# Patient Record
Sex: Female | Born: 1940 | Race: White | Hispanic: No | Marital: Married | Smoking: Never smoker
Health system: Southern US, Community
[De-identification: ages and names within clinical notes are randomized; demographics above are authoritative.]

## PROBLEM LIST (undated history)

## (undated) DIAGNOSIS — E785 Hyperlipidemia, unspecified: Secondary | ICD-10-CM

## (undated) DIAGNOSIS — I447 Left bundle-branch block, unspecified: Secondary | ICD-10-CM

## (undated) DIAGNOSIS — M545 Low back pain, unspecified: Secondary | ICD-10-CM

## (undated) DIAGNOSIS — D869 Sarcoidosis, unspecified: Secondary | ICD-10-CM

## (undated) DIAGNOSIS — K219 Gastro-esophageal reflux disease without esophagitis: Secondary | ICD-10-CM

## (undated) DIAGNOSIS — J329 Chronic sinusitis, unspecified: Secondary | ICD-10-CM

## (undated) DIAGNOSIS — T8859XA Other complications of anesthesia, initial encounter: Secondary | ICD-10-CM

## (undated) DIAGNOSIS — Z972 Presence of dental prosthetic device (complete) (partial): Secondary | ICD-10-CM

## (undated) DIAGNOSIS — G4730 Sleep apnea, unspecified: Secondary | ICD-10-CM

## (undated) DIAGNOSIS — I480 Paroxysmal atrial fibrillation: Secondary | ICD-10-CM

## (undated) DIAGNOSIS — G8929 Other chronic pain: Secondary | ICD-10-CM

## (undated) DIAGNOSIS — E876 Hypokalemia: Secondary | ICD-10-CM

## (undated) DIAGNOSIS — E079 Disorder of thyroid, unspecified: Secondary | ICD-10-CM

## (undated) DIAGNOSIS — Q248 Other specified congenital malformations of heart: Secondary | ICD-10-CM

## (undated) DIAGNOSIS — R002 Palpitations: Secondary | ICD-10-CM

## (undated) DIAGNOSIS — Z8489 Family history of other specified conditions: Secondary | ICD-10-CM

## (undated) DIAGNOSIS — M1990 Unspecified osteoarthritis, unspecified site: Secondary | ICD-10-CM

## (undated) DIAGNOSIS — N390 Urinary tract infection, site not specified: Secondary | ICD-10-CM

## (undated) DIAGNOSIS — C4490 Unspecified malignant neoplasm of skin, unspecified: Secondary | ICD-10-CM

## (undated) DIAGNOSIS — I499 Cardiac arrhythmia, unspecified: Secondary | ICD-10-CM

## (undated) DIAGNOSIS — E039 Hypothyroidism, unspecified: Secondary | ICD-10-CM

## (undated) DIAGNOSIS — F419 Anxiety disorder, unspecified: Secondary | ICD-10-CM

## (undated) DIAGNOSIS — K5190 Ulcerative colitis, unspecified, without complications: Secondary | ICD-10-CM

## (undated) DIAGNOSIS — E559 Vitamin D deficiency, unspecified: Secondary | ICD-10-CM

## (undated) HISTORY — DX: Urinary tract infection, site not specified: N39.0

## (undated) HISTORY — DX: Sarcoidosis, unspecified: D86.9

## (undated) HISTORY — DX: Low back pain, unspecified: M54.50

## (undated) HISTORY — PX: BREAST CYST ASPIRATION: SHX578

## (undated) HISTORY — PX: OTHER SURGICAL HISTORY: SHX169

## (undated) HISTORY — DX: Unspecified malignant neoplasm of skin, unspecified: C44.90

## (undated) HISTORY — DX: Essential (primary) hypertension: I10

## (undated) HISTORY — DX: Gastro-esophageal reflux disease without esophagitis: K21.9

## (undated) HISTORY — DX: Chronic sinusitis, unspecified: J32.9

## (undated) HISTORY — DX: Low back pain: M54.5

## (undated) HISTORY — DX: Other chronic pain: G89.29

## (undated) HISTORY — DX: Hypothyroidism, unspecified: E03.9

## (undated) HISTORY — DX: Palpitations: R00.2

## (undated) HISTORY — DX: Hyperlipidemia, unspecified: E78.5

---

## 2004-10-16 ENCOUNTER — Ambulatory Visit: Payer: Self-pay | Admitting: Internal Medicine

## 2005-12-14 ENCOUNTER — Ambulatory Visit: Payer: Self-pay | Admitting: Internal Medicine

## 2007-01-13 ENCOUNTER — Ambulatory Visit: Payer: Self-pay | Admitting: Internal Medicine

## 2008-03-22 ENCOUNTER — Ambulatory Visit: Payer: Self-pay | Admitting: Internal Medicine

## 2009-04-03 ENCOUNTER — Ambulatory Visit: Payer: Self-pay | Admitting: Internal Medicine

## 2009-04-08 ENCOUNTER — Ambulatory Visit: Payer: Self-pay | Admitting: Internal Medicine

## 2009-10-07 ENCOUNTER — Ambulatory Visit: Payer: Self-pay | Admitting: Internal Medicine

## 2010-12-04 ENCOUNTER — Ambulatory Visit: Payer: Self-pay | Admitting: Internal Medicine

## 2011-11-16 ENCOUNTER — Encounter: Payer: Self-pay | Admitting: *Deleted

## 2011-11-16 ENCOUNTER — Encounter: Payer: Self-pay | Admitting: Internal Medicine

## 2011-11-17 ENCOUNTER — Encounter: Payer: Self-pay | Admitting: Internal Medicine

## 2011-11-17 ENCOUNTER — Ambulatory Visit (INDEPENDENT_AMBULATORY_CARE_PROVIDER_SITE_OTHER): Payer: Medicare Other | Admitting: Internal Medicine

## 2011-11-17 ENCOUNTER — Ambulatory Visit (INDEPENDENT_AMBULATORY_CARE_PROVIDER_SITE_OTHER)
Admission: RE | Admit: 2011-11-17 | Discharge: 2011-11-17 | Disposition: A | Discharge status: Home / Self Care | Payer: Medicare Other | Source: Ambulatory Visit | Attending: Internal Medicine | Admitting: Internal Medicine

## 2011-11-17 VITALS — BP 142/76 | HR 76 | Temp 97.6°F | Ht 62.0 in | Wt 147.4 lb

## 2011-11-17 DIAGNOSIS — I2721 Secondary pulmonary arterial hypertension: Secondary | ICD-10-CM

## 2011-11-17 DIAGNOSIS — D869 Sarcoidosis, unspecified: Secondary | ICD-10-CM | POA: Insufficient documentation

## 2011-11-17 DIAGNOSIS — I2789 Other specified pulmonary heart diseases: Secondary | ICD-10-CM

## 2011-11-17 NOTE — Assessment & Plan Note (Signed)
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  For now do not recommend anything but the basic w/u to include V/Q and ono RA and will return for pft's and presentation to Dr Delton Coombes once these labs are available to review plus baseline 6 min walk

## 2011-11-17 NOTE — Progress Notes (Deleted)
Subjective:    Patient ID: Kristi Barrera, female    DOB: 03-22-1941, 71 y.o.   MRN: [id number]  HPI    Review of Systems  Constitutional: Negative for fever, chills and unexpected weight change.  HENT: Negative for ear pain, nosebleeds, congestion, sore throat, rhinorrhea, sneezing, trouble swallowing, dental problem, voice change, postnasal drip and sinus pressure.   Eyes: Negative for visual disturbance.  Respiratory: Negative for cough, choking and shortness of breath.   Cardiovascular: Negative for chest pain and leg swelling.  Gastrointestinal: Negative for vomiting, abdominal pain and diarrhea.  Genitourinary: Negative for difficulty urinating.  Musculoskeletal: Negative for arthralgias.  Skin: Negative for rash.  Neurological: Negative for tremors, syncope and headaches.  Hematological: Does not bruise/bleed easily.       Objective:   Physical Exam        Assessment & Plan:

## 2011-11-17 NOTE — Patient Instructions (Addendum)
Please remember to go to the  x-ray department downstairs for your tests - we will call you with the results when they are available.    Please see patient coordinator before you leave today  to schedule v/q lung scan and an overnight oxygen study  Please schedule a follow up office visit in 6 weeks, call sooner if needed with PFT's and 6 minute walk on return

## 2011-11-17 NOTE — Progress Notes (Signed)
Subjective:    Patient ID: Kristi Barrera, female    DOB: 10/18/1940   MRN: [id number]  HPI  45 yowm never smoker dx at [facility] in 1980[facility] at [facility] with sarcoid (doesn't remember bx) never treated with prednisone referred by Dr Judithann Sheen to pulmonary clinic 11/17/2011 for [facility]   11/17/2011 1st pulmonary eval cc swoshing in ear lead to echo dx of PAH, stopped walking in Nov 2012 due to knee but able to vacuum s sob and denies ever being limited by breathing.. No h/o dvt/ blood clots / hypersomnolence, collagen vasc dz/ liver dz or HIV exp  Sleeping ok without nocturnal  or early am exacerbation  of respiratory  C/o's.  Also denies any obvious fluctuation of symptoms with weather or environmental changes or other aggravating or alleviating factors except as outlined above.  Review of Systems  Constitutional: Negative for fever, chills and unexpected weight change.  HENT: Positive for sneezing and postnasal drip. Negative for ear pain, nosebleeds, congestion, sore throat, rhinorrhea, trouble swallowing, dental problem, voice change and sinus pressure.   Eyes: Negative for visual disturbance.  Respiratory: Negative for cough, choking and shortness of breath.   Cardiovascular: Negative for chest pain and leg swelling.  Gastrointestinal: Negative for vomiting, abdominal pain and diarrhea.  Genitourinary: Negative for difficulty urinating.  Musculoskeletal: Negative for arthralgias.  Skin: Negative for rash.  Neurological: Negative for tremors, syncope and headaches.  Hematological: Does not bruise/bleed easily.       Objective:   Physical Exam  Pleasant amb wf nad  Wt 147 11/17/2011 HEENT: nl dentition, turbinates, and orophanx. Nl external ear canals without cough reflex   NECK :  without JVD/Nodes/TM/ nl carotid upstrokes bilaterally   LUNGS: no acc muscle use, clear to A and P bilaterally without cough on insp or exp maneuvers   CV:  RRR  no s3 or murmur or increase in P2, no edema    ABD:  soft and nontender with nl excursion in the supine position. No bruits or organomegaly, bowel sounds nl  MS:  warm without deformities, calf tenderness, cyanosis or clubbing  SKIN: warm and dry without lesions    NEURO:  alert, approp, no deficits     CXR  11/17/2011 :  Findings: Airspace disease in the right lower lobe compatible with pneumonia. Heart is normal size. Left lung is clear. No effusions or acute bony abnormality.         Assessment & Plan:

## 2011-11-18 ENCOUNTER — Telehealth: Payer: Self-pay | Admitting: Internal Medicine

## 2011-11-18 NOTE — Telephone Encounter (Signed)
Spoke with pt. She states that when she went to [facility] to get copy of cxr on a disc, they advised that she did not have cxr done there. She states that her last cxr was done yrs ago and she can not recall where she had this done. I called Dr Judithann Sheen to double check and I was also told she never had cxr done there. Will forward to MW so that he is aware. Pt is coming back 12-31-11 for rov with 6mw and PFT.

## 2011-11-19 NOTE — Telephone Encounter (Signed)
aware

## 2011-11-23 ENCOUNTER — Encounter: Payer: Self-pay | Admitting: Internal Medicine

## 2011-11-24 ENCOUNTER — Other Ambulatory Visit: Payer: Self-pay | Admitting: Internal Medicine

## 2011-11-24 ENCOUNTER — Telehealth: Payer: Self-pay | Admitting: Internal Medicine

## 2011-11-24 ENCOUNTER — Encounter (HOSPITAL_COMMUNITY)
Admission: RE | Admit: 2011-11-24 | Discharge: 2011-11-24 | Disposition: A | Discharge status: Home / Self Care | Payer: Medicare Other | Source: Ambulatory Visit | Attending: Internal Medicine | Admitting: Internal Medicine

## 2011-11-24 DIAGNOSIS — I10 Essential (primary) hypertension: Secondary | ICD-10-CM | POA: Insufficient documentation

## 2011-11-24 DIAGNOSIS — J984 Other disorders of lung: Secondary | ICD-10-CM | POA: Insufficient documentation

## 2011-11-24 DIAGNOSIS — J449 Chronic obstructive pulmonary disease, unspecified: Secondary | ICD-10-CM

## 2011-11-24 DIAGNOSIS — I2721 Secondary pulmonary arterial hypertension: Secondary | ICD-10-CM

## 2011-11-24 DIAGNOSIS — I2789 Other specified pulmonary heart diseases: Secondary | ICD-10-CM | POA: Insufficient documentation

## 2011-11-24 MED ORDER — XENON XE 133 GAS
20.2000 | GAS_FOR_INHALATION | Freq: Once | RESPIRATORY_TRACT | Status: AC | PRN
  Administered 2011-11-24: 20.2 via RESPIRATORY_TRACT

## 2011-11-24 MED ORDER — TECHNETIUM TO 99M ALBUMIN AGGREGATED
3.1000 | Freq: Once | INTRAVENOUS | Status: AC | PRN
  Administered 2011-11-24: 3 via INTRAVENOUS

## 2011-11-24 NOTE — Telephone Encounter (Signed)
I spoke with pt and she is requesting her test results that were done today. Also pt was upset that no one called her to let her know she was going to be set up on oxygen at night until the dme company had called and told her. Pt aware MW is not in the office this afternoon and was fine with a call back when he returns. Please advise MW thanks

## 2011-11-25 ENCOUNTER — Encounter: Payer: Self-pay | Admitting: Internal Medicine

## 2011-11-25 NOTE — Progress Notes (Signed)
Quick Note:  Spoke with pt and notified of results per Dr. Wert. Pt verbalized understanding and denied any questions.  ______ 

## 2011-11-25 NOTE — Telephone Encounter (Signed)
Discussed 02 study and need to wear it at hs for now Plans f/u in [place]  >>>Need cxr reports from Dr Judithann Sheen office to complete the w/u here.

## 2011-11-25 NOTE — Telephone Encounter (Signed)
I spoke with the pt and she states that she wants to keep her appt here with MW on 12-31-11 b.c she is already scheduled for 6mw and pft's same day. cxr never done @ Dr Judithann Sheen office. MW aware.

## 2011-12-04 ENCOUNTER — Encounter: Payer: Self-pay | Admitting: Internal Medicine

## 2011-12-16 ENCOUNTER — Ambulatory Visit: Payer: Self-pay | Admitting: Internal Medicine

## 2011-12-31 ENCOUNTER — Other Ambulatory Visit (INDEPENDENT_AMBULATORY_CARE_PROVIDER_SITE_OTHER): Payer: Medicare Other

## 2011-12-31 ENCOUNTER — Ambulatory Visit (INDEPENDENT_AMBULATORY_CARE_PROVIDER_SITE_OTHER): Payer: Medicare Other | Admitting: Internal Medicine

## 2011-12-31 ENCOUNTER — Encounter: Payer: Self-pay | Admitting: Internal Medicine

## 2011-12-31 VITALS — BP 136/68 | HR 76 | Temp 98.3°F | Ht 62.0 in | Wt 146.0 lb

## 2011-12-31 DIAGNOSIS — R0600 Dyspnea, unspecified: Secondary | ICD-10-CM

## 2011-12-31 DIAGNOSIS — D869 Sarcoidosis, unspecified: Secondary | ICD-10-CM

## 2011-12-31 DIAGNOSIS — I2721 Secondary pulmonary arterial hypertension: Secondary | ICD-10-CM

## 2011-12-31 DIAGNOSIS — I2789 Other specified pulmonary heart diseases: Secondary | ICD-10-CM

## 2011-12-31 LAB — PULMONARY FUNCTION TEST

## 2011-12-31 NOTE — Progress Notes (Signed)
Subjective:    Patient ID: Kristi Barrera, female    DOB: 07-06-1941   MRN: [id number]  HPI  77 yowm never smoker dx at [facility] in 1980's at [facility] with sarcoid (doesn't remember bx) never treated with prednisone referred by Dr Judithann Sheen to pulmonary clinic 11/17/2011 for [facility]   11/17/2011 1st pulmonary eval cc swoshing in ear  >  echo dx of PAH, stopped walking in Nov 2012 due to knee but able to vacuum s sob and denies ever being limited by breathing.. No h/o dvt/ blood clots / hypersomnolence, collagen vasc dz/ liver dz or HIV exp rec   schedule   - V/Q lung scan 11/24/11 >  Matched defect RLL,  Small unmatched defect Lingula (very unlikely to cause PAH)    - ONO RA 11/17/2011 >  Sat < 88% x > rec noct 02 2lpm    12/31/2011 f/u ov/[employer] cc sleeping better on 02, no limiting sob, no sign cough. No ocular or articular complaints or rash.  Sleeping ok without nocturnal  or early am exacerbation  of respiratory  C/o's.  Also denies any obvious fluctuation of symptoms with weather or environmental changes or other aggravating or alleviating factors except as outlined above.  ROS  At present neg for  any significant sore throat, dysphagia, dental problems, itching, sneezing,  nasal congestion or excess/ purulent secretions, ear ache,   fever, chills, sweats, unintended wt loss, pleuritic or exertional cp, hemoptysis, palpitations, orthopnea pnd or leg swelling.  Also denies presyncope, palpitations, heartburn, abdominal pain, anorexia, nausea, vomiting, diarrhea  or change in bowel or urinary habits, change in stools or urine, dysuria,hematuria,  rash, arthralgias, visual complaints, headache, numbness weakness or ataxia or problems with walking or coordination. No noted change in mood/affect or memory.            Objective:   Physical Exam  Pleasant amb wf nad  Wt 147 11/17/2011 > 12/31/2011  146   HEENT: nl dentition, turbinates, and orophanx. Nl external ear canals without cough  reflex   NECK :  without JVD/Nodes/TM/ nl carotid upstrokes bilaterally   LUNGS: no acc muscle use,  R lung base crackles on  on insp but no cough  CV:  RRR  no s3 or murmur or increase in P2, no edema   ABD:  soft and nontender with nl excursion in the supine position. No bruits or organomegaly, bowel sounds nl  MS:  warm without deformities, calf tenderness, cyanosis or clubbing       CXR  11/17/2011 :  Findings: Airspace disease in the right lower lobe compatible with pneumonia. Heart is normal size. Left lung is clear. No effusions or acute bony abnormality.         Assessment & Plan:

## 2011-12-31 NOTE — Patient Instructions (Signed)
Please see patient coordinator before you leave today  to schedule echocardiogram to be done the first week in August.  You need to be seen  within a week after the echocardiogram by  Dr Kendrick Fries in [place]  Continue to wear the 02 at bedtime for now  Please remember to go to the lab  department downstairs for your tests - we will call you with the results when they are available.

## 2011-12-31 NOTE — Progress Notes (Signed)
PFT done today. 

## 2011-12-31 NOTE — Assessment & Plan Note (Signed)
-   Dx by Guido Sander mid 80's.   - Abn cxr 11/17/2011 > previous cxr's requested.   - PFT's 12/31/2011 FEV1 1.55 (82%) ratio 69 and DLC0 68% and DLC0 121   - ESR 15, ace level < 1 on 12/31/2011 .   No indication for rx at this point.  A good general rule of thumb for sarcoid is as that if it didn't need rx at dx then it shouldn't need rx now years after the original dx with no convincing dz activity in meantime and no role in rx of PAH anyway, the primary issue she has at this point.

## 2011-12-31 NOTE — Assessment & Plan Note (Addendum)
-   Kristi Barrera 10/27/11 Mild mr with nl LA, PA pressure 51 worse vs prev study 2011    - V/Q lung scan 11/24/11 >  Matched defect RLL,  Small unmatched defect Lingula (very unlikely to cause PAH)    - ONO RA 11/17/2011 >  Sat < 88% x > rec noct 02 2lpm    - 6 min walk 12/31/2011 > 363 m and no desat  Not clear she is really limited by PAH at this point and does have noct desat so reasonable to try hs 02 x 3 months then repeat Kristi and see if any progression or regression in severity pa systolic

## 2011-12-31 NOTE — Progress Notes (Signed)
Quick Note:  Spoke with pt and notified of results per Dr. Wert. Pt verbalized understanding and denied any questions.  ______ 

## 2012-01-01 NOTE — Progress Notes (Signed)
Quick Note:  Spoke with pt and notified of results per Dr. Wert. Pt verbalized understanding and denied any questions.  ______ 

## 2012-02-23 ENCOUNTER — Other Ambulatory Visit: Payer: Self-pay

## 2012-02-23 ENCOUNTER — Other Ambulatory Visit (INDEPENDENT_AMBULATORY_CARE_PROVIDER_SITE_OTHER): Payer: Medicare Other

## 2012-02-23 DIAGNOSIS — R0609 Other forms of dyspnea: Secondary | ICD-10-CM

## 2012-02-23 DIAGNOSIS — I2789 Other specified pulmonary heart diseases: Secondary | ICD-10-CM

## 2012-02-23 DIAGNOSIS — I2721 Secondary pulmonary arterial hypertension: Secondary | ICD-10-CM

## 2012-02-24 ENCOUNTER — Encounter: Payer: Self-pay | Admitting: Internal Medicine

## 2012-02-29 ENCOUNTER — Encounter: Payer: Self-pay | Admitting: Pulmonary Disease

## 2012-02-29 ENCOUNTER — Ambulatory Visit (INDEPENDENT_AMBULATORY_CARE_PROVIDER_SITE_OTHER): Payer: Medicare Other | Admitting: Pulmonary Disease

## 2012-02-29 ENCOUNTER — Telehealth: Payer: Self-pay | Admitting: Internal Medicine

## 2012-02-29 VITALS — BP 122/72 | HR 78 | Temp 98.2°F | Ht 62.0 in | Wt 147.0 lb

## 2012-02-29 DIAGNOSIS — G4710 Hypersomnia, unspecified: Secondary | ICD-10-CM

## 2012-02-29 DIAGNOSIS — R400 Somnolence: Secondary | ICD-10-CM

## 2012-02-29 DIAGNOSIS — I2789 Other specified pulmonary heart diseases: Secondary | ICD-10-CM

## 2012-02-29 DIAGNOSIS — G4733 Obstructive sleep apnea (adult) (pediatric): Secondary | ICD-10-CM

## 2012-02-29 DIAGNOSIS — D869 Sarcoidosis, unspecified: Secondary | ICD-10-CM

## 2012-02-29 DIAGNOSIS — I2721 Secondary pulmonary arterial hypertension: Secondary | ICD-10-CM

## 2012-02-29 NOTE — Assessment & Plan Note (Addendum)
Her daytime somnolence and paroxysmal nocturnal dyspnea in the setting of mildly elevated PA pressure are suggestive of obstructive sleep apnea.  Plan: -[facility] at [facility].

## 2012-02-29 NOTE — Progress Notes (Signed)
Patient ID: Kristi Barrera, female    DOB: [birth year]   MRN: [id number]  Synopsis: [name] was first referred to the [facility] office for pulmonary hypertension April 2013. She has a past medical history significant for sarcoidosis diagnosed by Dr. Guido Sander at [facility] in the 1980s. She has never required treatment for this. In 2013 she was found to have a PA pressure of 51 on a transthoracic echocardiogram in the setting of no symptoms and was referred to [facility] pulmonary for further evaluation. A CT scan performed in may of 2013 showed a small match defect in the right lower lobe. An overnight sleep oximetry study on room air in April 2013 showed oxygen desaturation below 88% for 11 minutes and she was given 2 L of oxygen for nocturnal use.    HPI   Subjective:   02/29/12 ROV -- Mrs. Kristi Barrera comes to our clinic today for further evaluation of pulmonary hypertension. As noted above she's been followed by Dr. Sherene Sires recently for pulmonary hypertension found on a transthoracic echocardiogram. She tells me that she has no symptoms of shortness of breath and has no limitations. She vacuums her house, goes to the grocery store, and exercises it least once a week without limitation. She does not have cough or chest pain. She states that she has occasional ankle swelling which her primary care physician has attributed to amlodipine. She states that she does often wake up gasping for air in the middle the night and frequently has daytime somnolence. Typically she wakes up in the middle of a dream. Past Medical History  Diagnosis Date  . GERD (gastroesophageal reflux disease)   . Palpitations   . Hypothyroidism   . HTN (hypertension)   . Hyperlipidemia   . Chest pain   . Sinusitis   . Recurrent UTI   . Chronic low back pain   . Sarcoidosis     [place]---[facility] 15-25yrs ago  . Skin cancer         Review of Systems  Constitutional: Negative for fever, diaphoresis and fatigue.  HENT:  Negative for nosebleeds, congestion, rhinorrhea and postnasal drip.   Respiratory: Negative for cough, chest tightness, shortness of breath and stridor.   Cardiovascular: Positive for leg swelling. Negative for chest pain.     Physical Exam  Filed Vitals:   02/29/12 1349  BP: 122/72  Pulse: 78  Temp: 98.2 F (36.8 C)  TempSrc: Oral  Height: 5\' 2"  (1.575 m)  Weight: 147 lb (66.679 kg)  SpO2: 98%   Gen: well appearing, no acute distress HEENT: NCAT, PERRL, EOMi, OP clear, neck supple without masses PULM: CTA B CV: RRR, systolic murmur RUSB, no JVD AB: BS+, soft, nontender, no hsm Ext: warm, no edema, no clubbing, no cyanosis Derm: no rash or skin breakdown Neuro: A&Ox4, CN II-XII intact, strength 5/5 in all 4 extremities  Assessment & Plan:   PAH (pulmonary artery hypertension) Mrs. low has been found to have an elevated PA pressure on an echocardiogram in 2013 and based on this she has had a workup for pulmonary hypertension. She's not had a diagnostic test for pulmonary hypertension which is a right heart catheterization. Considering the fact that she is completely asymptomatic and has no shortness of breath think it would be unwise first proceed with a right heart cath at this point. She has symptoms are consistent with obstructive sleep apnea and that she has daytime somnolence and often wakes up gasping for air while dreaming. I explained to  her that I think that this may be a reasonable explanation for her shortness of breath. Alternatively sarcoid can rarely cause pulmonary hypertension. Regardless of the cause I think we are safe to hold off on treatment considering her excellent functional status.  Plan: -Sleep study -Followup results of annual echocardiogram being performed by her cardiologist for valvular insufficiency.  Sarcoidosis She has a persistent right lower lobe infiltrate which she states she's noted out since the 1980s. At that point she underwent a  transbronchial biopsy by Dr. Guido Sander and was diagnosed with sarcoidosis. Her imaging findings are stable and she is asymptomatic so there is no indication for treatment at this time.  OSA (obstructive sleep apnea) Her daytime somnolence and paroxysmal nocturnal dyspnea in the setting of mildly elevated PA pressure are suggestive of obstructive sleep apnea.  Plan: -[facility] at [facility].   Updated Medication List Outpatient Encounter Prescriptions as of 02/29/2012  Medication Sig Dispense Refill  . ALPRAZolam (XANAX) 0.25 MG tablet Take 0.25 mg by mouth 2 (two) times daily as needed.       Marland Kitchen amLODipine (NORVASC) 10 MG tablet Take 5 mg by mouth daily.       . benazepril (LOTENSIN) 40 MG tablet Take 40 mg by mouth daily.      Marland Kitchen esomeprazole (NEXIUM) 40 MG capsule Take 40 mg by mouth as needed.       . fenofibrate micronized (LOFIBRA) 134 MG capsule Take 1 tablet by mouth Daily.      . furosemide (LASIX) 40 MG tablet As needed 1-2 times a week 20 mg      . levothyroxine (SYNTHROID) 100 MCG tablet Take 100 mcg by mouth daily.      Marland Kitchen olmesartan (BENICAR) 40 MG tablet Take 40 mg by mouth daily.      . verapamil (VERELAN PM) 240 MG 24 hr capsule Take 240 mg by mouth at bedtime.

## 2012-02-29 NOTE — Assessment & Plan Note (Addendum)
Mrs. low has been found to have an elevated PA pressure on an echocardiogram in 2013 and based on this she has had a workup for pulmonary hypertension. She's not had a diagnostic test for pulmonary hypertension which is a right heart catheterization. Considering the fact that she is completely asymptomatic and has no shortness of breath think it would be unwise first proceed with a right heart cath at this point. She has symptoms are consistent with obstructive sleep apnea and that she has daytime somnolence and often wakes up gasping for air while dreaming. I explained to her that I think that this may be a reasonable explanation for her shortness of breath. Alternatively sarcoid can rarely cause pulmonary hypertension. Regardless of the cause I think we are safe to hold off on treatment considering her excellent functional status.  Plan: -Sleep study -Followup results of annual echocardiogram being performed by her cardiologist for valvular insufficiency.

## 2012-02-29 NOTE — Telephone Encounter (Signed)
Notes Recorded by Nyoka Cowden, MD on 02/24/2012 at 6:23 AM Call patient : Study is c/w PAH, needs eval by [facility] asap with other alternative back to [facility] clinic  I spoke with patient about results and she verbalized understanding and had no questions. She states she is seeing Dr. Kendrick Fries today and will discuss this with him

## 2012-02-29 NOTE — Assessment & Plan Note (Signed)
She has a persistent right lower lobe infiltrate which she states she's noted out since the 1980s. At that point she underwent a transbronchial biopsy by Dr. Guido Sander and was diagnosed with sarcoidosis. Her imaging findings are stable and she is asymptomatic so there is no indication for treatment at this time.

## 2012-02-29 NOTE — Patient Instructions (Signed)
Keep using your oxygen at night We will send you for a sleep study at [facility] here in [facility] exercising regularly We will see you back in 3 months

## 2012-03-07 ENCOUNTER — Telehealth: Payer: Self-pay | Admitting: Pulmonary Disease

## 2012-03-07 NOTE — Telephone Encounter (Signed)
Spoke with pt. She is requesting rx for Remus Loffler b/s this is what the tech at [facility] in [place] advised. She states that she does not want to take med at all, and does not fear not sleeping well. She asked the tech is she could just take a xanax, since she is already prescribed this and feels comfortable with it and they said, no, "we prefer that you take ambien".  I want to speak with someone at "[facility]" to confirm that this is what they are telling her. ATC x 4 at [phone] and line is busy, WCB.

## 2012-03-08 NOTE — Telephone Encounter (Signed)
Called, spoke with pt.  I informed her of below per Dr. Sherene Sires.  She verbalized understanding of this.  Will sign off and route to Mr. Kendrick Fries as Lorain Childes.

## 2012-03-08 NOTE — Telephone Encounter (Signed)
Called [facility]," spoke with Kristi Barrera who states they are recommending pt "take a sleep aid that does not interfere with REM sleep."  I asked if xanax would work and Kristi Barrera replied, "It probably will."  I called, spoke with pt.  The sleep study is scheduled for tomorrow night.  She would like to take xanax 2 tablets of 0.25 mg (she already has this) instead of ambien bc she is comfortable with the xanax as long as it doesn't interfere with REM sleep.  Kristi Barrera is off -- she is requesting Kristi Barrera advise as she has seen him in the past.  Kristi Barrera, pls advise.  Thank you.  [facility] [address] in [place]  Note:  Pt requesting we call her back at [phone].

## 2012-03-08 NOTE — Telephone Encounter (Signed)
Pt states that she will not be available between 1:30 pm & 4:30 pm today.  Antionette Fairy

## 2012-03-08 NOTE — Telephone Encounter (Signed)
Ok to use xanax if typically using this at home

## 2012-03-10 ENCOUNTER — Telehealth: Payer: Self-pay | Admitting: Internal Medicine

## 2012-03-10 NOTE — Telephone Encounter (Signed)
I spoke with pt and she stated she had her sleep study done ([facility]) and was told she would need a cpap to wear at night. 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 Please advise Dr. Kendrick Fries thanks

## 2012-03-14 NOTE — Telephone Encounter (Signed)
OK, fine by me 

## 2012-03-14 NOTE — Telephone Encounter (Signed)
noted 

## 2012-03-15 NOTE — Telephone Encounter (Signed)
I spoke with pt and made her okay by Dr. Kendrick Fries to hold off on cpap until after her surgery. She voiced her understanding and will continue to use her oxygen at bedtime. Nothing further was needed

## 2012-04-19 ENCOUNTER — Telehealth: Payer: Self-pay | Admitting: *Deleted

## 2012-04-19 ENCOUNTER — Encounter: Payer: Self-pay | Admitting: Pulmonary Disease

## 2012-04-19 NOTE — Telephone Encounter (Signed)
Called [facility] and spoke with [place].  She states that it is rec that the pt repeat the CPAP titration study, since REM sleep was not observed. She states that she contacted the pt herself to reschedule this, and pt told her that she is getting ready to have surgery, and will have to call back after this is done.

## 2012-04-19 NOTE — Telephone Encounter (Signed)
thanks

## 2012-04-19 NOTE — Telephone Encounter (Signed)
Message copied by Christen Butter on Tue Apr 19, 2012  1:57 PM ------      Message from: Veto Kemps B      Created: Tue Apr 19, 2012  1:30 PM       L,            Canyou call to see if she needs an Rx for CPAP or if she needs to go back for another? Her recent PSG says she needs 6cm H20, but they suggested repeating the test.            B

## 2012-05-02 ENCOUNTER — Telehealth: Payer: Self-pay | Admitting: Pulmonary Disease

## 2012-05-02 NOTE — Telephone Encounter (Signed)
I have not seen the study.    I felt that she needed a CPAP titration because it had been years since she had one.  It is really between the insurance company and [facility] if she needs the third study, not sure what I have to add other than I think she needs an adequate CPAP titration study.

## 2012-05-02 NOTE — Telephone Encounter (Signed)
Spoke to pt she [facility] 2 [facility] at [facility] in [place] the second study the person at [facility] told her she did not go into a deep rem sleep and that she would have to repeat her second study again she called her insurance company not anyone else but i am not sure if dr Kendrick Fries has even seen the 2 studies yet they are not scanned into chart so i advisted her not to do anything until she heard from dr [facility] about the results from the 2 studies Tobe Sos

## 2012-05-02 NOTE — Telephone Encounter (Signed)
I spoke with pt and she stated her insurance is wanting proof that pt needs this 3rd sleep study done (cpap titration ordered by Dr. Kendrick Fries in september 2013). She stated the insurance is wanting our office to contact them. Please advise PCC's thanks

## 2012-05-03 NOTE — Telephone Encounter (Signed)
Called [facility] and had them to fax both sleep studies here so dr Kendrick Fries qould review them Tobe Sos

## 2012-05-12 ENCOUNTER — Telehealth: Payer: Self-pay | Admitting: Pulmonary Disease

## 2012-05-12 DIAGNOSIS — G4733 Obstructive sleep apnea (adult) (pediatric): Secondary | ICD-10-CM

## 2012-05-12 NOTE — Telephone Encounter (Signed)
I spoke with  Verlon Au and she does have the pt sleep studies to give to Dr. Kendrick Fries on Monday. I advised the pt of this and she states understanding. I am going to forward message to Dr. Kendrick Fries as well as a reminder. Please advise. Carron Curie, CMA

## 2012-05-16 NOTE — Telephone Encounter (Signed)
I have been able to review the tests.  The sleep study said they were not adequate tests and they want her to come back, but I disagree.  Tell her to continue using CPAP 6cm H20 at night.

## 2012-05-16 NOTE — Telephone Encounter (Signed)
LMOMTCB x 1 for the pt. 

## 2012-05-16 NOTE — Telephone Encounter (Signed)
The notes I received from the sleep center state that she did well on 6cm H20 CPAP.  We will write an Rx for this.

## 2012-05-16 NOTE — Telephone Encounter (Signed)
Spoke with pt and she is aware and wants to use [facility]  On [address] in [place].

## 2012-05-16 NOTE — Telephone Encounter (Signed)
Pt returned call. Kristi Barrera  

## 2012-05-16 NOTE — Telephone Encounter (Signed)
lmomtcb  

## 2012-05-16 NOTE — Telephone Encounter (Signed)
I have seen both studies.  Keep using CPAP at 6cm H20.  Unless this is contrary to what they told her, this is the dose that I interpreted from her results.

## 2012-05-16 NOTE — Telephone Encounter (Signed)
Returned call from patient.Marland KitchenMarland KitchenPatient states that she never received her CPAP--she was never set up for one d/t the center ([facility]) telling her she needed another sleep study. They told her that she needs to repeat her sleep study with ambien before they will give her a CPAP. Patient states that she does not feel that there is a need to have another study done and she does not want to take ambien-states she does not react well to those types of medications (has a hard time waking up). Would like to know if this is necessary or if there was a way around having another sleep study.   Dr Kendrick Fries pleae advise. Thanks.

## 2012-05-16 NOTE — Telephone Encounter (Signed)
Pt is calling for sleep study results. She states she has had 2 studies done, one without cpap and one with cpap and theses were done at the end of Aug 2013 and first part of Sept 2013. Dr. Kendrick Fries, pls advise.

## 2012-05-27 ENCOUNTER — Encounter: Payer: Self-pay | Admitting: Pulmonary Disease

## 2012-06-27 ENCOUNTER — Telehealth: Payer: Self-pay | Admitting: Pulmonary Disease

## 2012-06-27 NOTE — Telephone Encounter (Signed)
Left Message 3x to schd follow up apt. Sent letter 06/27/12. °

## 2012-07-19 ENCOUNTER — Encounter: Payer: Self-pay | Admitting: Pulmonary Disease

## 2012-08-16 ENCOUNTER — Ambulatory Visit (INDEPENDENT_AMBULATORY_CARE_PROVIDER_SITE_OTHER): Payer: Medicare Other | Admitting: Pulmonary Disease

## 2012-08-16 ENCOUNTER — Encounter: Payer: Self-pay | Admitting: Pulmonary Disease

## 2012-08-16 VITALS — BP 124/70 | HR 70 | Temp 98.0°F | Ht 62.0 in | Wt 150.0 lb

## 2012-08-16 DIAGNOSIS — I2789 Other specified pulmonary heart diseases: Secondary | ICD-10-CM

## 2012-08-16 DIAGNOSIS — G4733 Obstructive sleep apnea (adult) (pediatric): Secondary | ICD-10-CM

## 2012-08-16 DIAGNOSIS — I2721 Secondary pulmonary arterial hypertension: Secondary | ICD-10-CM

## 2012-08-16 DIAGNOSIS — D869 Sarcoidosis, unspecified: Secondary | ICD-10-CM

## 2012-08-16 NOTE — Progress Notes (Signed)
Patient ID: Kristi Barrera, female    DOB: Jul 06, 1941   MRN: [id number]  Synopsis: Kristi Barrera was first referred to the [facility] office for pulmonary hypertension April 2013. She has a past medical history significant for sarcoidosis diagnosed by Dr. Guido Barrera at [facility] in the 1980s. She has never required treatment for this. In 2013 she was found to have a PA pressure of 51 on a transthoracic echocardiogram in the setting of no symptoms and was referred to [facility] pulmonary for further evaluation. A CT scan performed in may of 2013 showed a small match defect in the right lower lobe. An overnight sleep oximetry study on room air in April 2013 showed oxygen desaturation below 88% for 11 minutes and she was given 2 L of oxygen for nocturnal use.    HPI    Subjective:   02/29/12 ROV -- Kristi Barrera comes to our clinic today for further evaluation of pulmonary hypertension. As noted above she's been followed by Dr. Sherene Barrera recently for pulmonary hypertension found on a transthoracic echocardiogram. She tells me that she has no symptoms of shortness of breath and has no limitations. She vacuums her house, goes to the grocery store, and exercises it least once a week without limitation. She does not have cough or chest pain. She states that she has occasional ankle swelling which her primary care physician has attributed to amlodipine. She states that she does often wake up gasping for air in the middle the night and frequently has daytime somnolence. Typically she wakes up in the middle of a dream.  08/16/12 ROV --Kristi Barrera has been doing great since her last visit. She is exercising 2-3 times a week by walking 1-1/2 miles a time. She states she walks at a brisk pace with a friend and experiences no shortness of breath with this exertion. She has been using her CPAP every night for 9 hours and says that she feels well rested throughout the day. She does not have to nap during the day. She denies cough or any  shortness of breath of any kind. She is able to perform all of her activities of daily living without difficulty. She does not have leg swelling. She denies chest pain. She does note some sinus congestion in the mornings after using CPAP via nasal pillows all night long.   Past Medical History  Diagnosis Date  . GERD (gastroesophageal reflux disease)   . Palpitations   . Hypothyroidism   . HTN (hypertension)   . Hyperlipidemia   . Chest pain   . Sinusitis   . Recurrent UTI   . Chronic low back pain   . Sarcoidosis     [place]---[facility] 15-65yrs ago  . Skin cancer         Review of Systems  Constitutional: Negative for fever, diaphoresis and fatigue.  HENT: Negative for nosebleeds, congestion, rhinorrhea and postnasal drip.   Respiratory: Negative for cough, chest tightness, shortness of breath and stridor.   Cardiovascular: Negative for chest pain and leg swelling.     Physical Exam   Filed Vitals:   08/16/12 0929  BP: 124/70  Pulse: 70  Temp: 98 F (36.7 C)  TempSrc: Oral  Height: 5\' 2"  (1.575 m)  Weight: 150 lb (68.04 kg)  SpO2: 98%   Gen: well appearing, no acute distress HEENT: NCAT, EOMi, OP clear,  PULM: CTA B CV: RRR, systolic murmur RUSB, no JVD AB: BS+, soft, nontender, no hsm Ext: warm, no edema, no clubbing, no  cyanosis  Assessment & Plan:   OSA (obstructive sleep apnea) Kristi Barrera to use and benefit from her CPAP. From what she describes she uses at all [facility] long for 9 hours of sleep from 11 PM through 8 AM every day. She struggles somewhat with an inability to sleep on her side because of the tubing.  Plan:  -instructed her to fix the tubing over her headboard so that she can sleep in the side position -Use saline gel for sinus congestion  PAH (pulmonary artery hypertension) Kristi Barrera to remain completely asymptomatic in terms of shortness of breath. The echocardiogram shows the elevated pressure given her complete lack of  symptoms and her regular exercise multiple times per week I see no reason to proceed with a right heart catheterization right now. We will continue to watch her with frequent 6 minute walk testing and frequent visits. She is currently treating her obstructive sleep apnea appropriately with CPAP.  Sarcoidosis Remains in remission. No indication for therapy.    Updated Medication List Outpatient Encounter Prescriptions as of 08/16/2012  Medication Sig Dispense Refill  . ALPRAZolam (XANAX) 0.25 MG tablet Take 0.25 mg by mouth 2 (two) times daily as needed.       Marland Kitchen amLODipine (NORVASC) 10 MG tablet Take 5 mg by mouth daily.       . benazepril (LOTENSIN) 40 MG tablet Take 40 mg by mouth daily.      Marland Kitchen esomeprazole (NEXIUM) 40 MG capsule Take 40 mg by mouth as needed.       . fenofibrate micronized (LOFIBRA) 134 MG capsule Take 1 tablet by mouth Daily.      . furosemide (LASIX) 40 MG tablet As needed 1-2 times a week 20 mg      . levothyroxine (SYNTHROID) 100 MCG tablet Take 100 mcg by mouth daily.      Marland Kitchen olmesartan (BENICAR) 40 MG tablet Take 40 mg by mouth daily.      . verapamil (VERELAN PM) 240 MG 24 hr capsule Take 240 mg by mouth at bedtime.

## 2012-08-16 NOTE — Patient Instructions (Signed)
Continue using your CPAP machine every night. Try to fix the CPAP hose over your headboard so that you can sleep on your side Use saline gel to help prevent sinus congestion Keep exercising regularly Let us know if you develop shortness of breath We will see you back in 6 months

## 2012-08-16 NOTE — Assessment & Plan Note (Signed)
Kristi Barrera continues to remain completely asymptomatic in terms of shortness of breath. The echocardiogram shows the elevated pressure given her complete lack of symptoms and her regular exercise multiple times per week I see no reason to proceed with a right heart catheterization right now. We will continue to watch her with frequent 6 minute walk testing and frequent visits. She is currently treating her obstructive sleep apnea appropriately with CPAP.

## 2012-08-16 NOTE — Assessment & Plan Note (Signed)
Kristi Barrera continues to use and benefit from her CPAP. From what she describes she uses at all [facility] long for 9 hours of sleep from 11 PM through 8 AM every day. She struggles somewhat with an inability to sleep on her side because of the tubing.  Plan:  -instructed her to fix the tubing over her headboard so that she can sleep in the side position -Use saline gel for sinus congestion

## 2012-08-16 NOTE — Assessment & Plan Note (Signed)
Remains in remission. No indication for therapy.

## 2012-09-21 ENCOUNTER — Encounter: Payer: Self-pay | Admitting: Pulmonary Disease

## 2012-12-03 ENCOUNTER — Ambulatory Visit: Payer: Self-pay | Admitting: General Practice

## 2013-01-17 ENCOUNTER — Ambulatory Visit: Payer: Self-pay | Admitting: Internal Medicine

## 2013-02-14 ENCOUNTER — Encounter: Payer: Self-pay | Admitting: Pulmonary Disease

## 2013-02-14 ENCOUNTER — Ambulatory Visit (INDEPENDENT_AMBULATORY_CARE_PROVIDER_SITE_OTHER): Payer: Medicare Other | Admitting: Pulmonary Disease

## 2013-02-14 VITALS — BP 142/60 | HR 60 | Temp 97.5°F | Ht 62.0 in | Wt 150.8 lb

## 2013-02-14 DIAGNOSIS — I2789 Other specified pulmonary heart diseases: Secondary | ICD-10-CM

## 2013-02-14 DIAGNOSIS — I2721 Secondary pulmonary arterial hypertension: Secondary | ICD-10-CM

## 2013-02-14 DIAGNOSIS — R0902 Hypoxemia: Secondary | ICD-10-CM

## 2013-02-14 DIAGNOSIS — G4734 Idiopathic sleep related nonobstructive alveolar hypoventilation: Secondary | ICD-10-CM

## 2013-02-14 DIAGNOSIS — G4733 Obstructive sleep apnea (adult) (pediatric): Secondary | ICD-10-CM

## 2013-02-14 NOTE — Progress Notes (Signed)
Patient ID: Kristi Barrera, female    DOB: [birth year]   MRN: [id number]  Synopsis: Kristi Barrera was first referred to the [facility] office for pulmonary hypertension April 2013. She has a past medical history significant for sarcoidosis diagnosed by Dr. Guido Sander at [facility] in the 1980s. She has never required treatment for this. In 2013 she was found to have a PA pressure of 51 on a transthoracic echocardiogram in the setting of no symptoms and was referred to [facility] pulmonary for further evaluation. A CT scan performed in may of 2013 showed a small match defect in the right lower lobe. An overnight sleep oximetry study on room air in April 2013 showed oxygen desaturation below 88% for 11 minutes and she was given 2 L of oxygen for nocturnal use.    HPI    Subjective:   02/29/12 ROV -- Kristi Barrera comes to our clinic today for further evaluation of pulmonary hypertension. As noted above she's been followed by Dr. Sherene Sires recently for pulmonary hypertension found on a transthoracic echocardiogram. She tells me that she has no symptoms of shortness of breath and has no limitations. She vacuums her house, goes to the grocery store, and exercises it least once a week without limitation. She does not have cough or chest pain. She states that she has occasional ankle swelling which her primary care physician has attributed to amlodipine. She states that she does often wake up gasping for air in the middle the night and frequently has daytime somnolence. Typically she wakes up in the middle of a dream.  08/16/12 ROV --Kristi Barrera has been doing great since her last visit. She is exercising 2-3 times a week by walking 1-1/2 miles a time. She states she walks at a brisk pace with a friend and experiences no shortness of breath with this exertion. She has been using her CPAP every night for 9 hours and says that she feels well rested throughout the day. She does not have to nap during the day. She denies cough or any  shortness of breath of any kind. She is able to perform all of her activities of daily living without difficulty. She does not have leg swelling. She denies chest pain. She does note some sinus congestion in the mornings after using CPAP via nasal pillows all night long.  02/14/2013 ROV >>  Kristi Barrera has been doing well since her last visit from a breathing standpoint. Unfortunately, she has not been able to exercise much because of severe knee pain. She is receiving injections in her knee and hopes to start exercising again in 2-3 weeks. She's not had cough, or shortness of breath with any activity. She continues to use and benefit from her CPAP machine every night. She continues to use and benefit from her oxygen at night.    Past Medical History  Diagnosis Date  . GERD (gastroesophageal reflux disease)   . Palpitations   . Hypothyroidism   . HTN (hypertension)   . Hyperlipidemia   . Chest pain   . Sinusitis   . Recurrent UTI   . Chronic low back pain   . Sarcoidosis     [place]---[facility] 15-26yrs ago  . Skin cancer         Review of Systems  Constitutional: Negative for fever, diaphoresis and fatigue.  HENT: Negative for nosebleeds, congestion, rhinorrhea and postnasal drip.   Respiratory: Negative for cough, chest tightness, shortness of breath and stridor.   Cardiovascular: Negative for chest pain  and leg swelling.     Physical Exam   Filed Vitals:   02/14/13 1031  BP: 142/60  Pulse: 60  Temp: 97.5 F (36.4 C)  TempSrc: Oral  Height: 5\' 2"  (1.575 m)  Weight: 150 lb 12.8 oz (68.402 kg)  SpO2: 97%  RA  Gen: well appearing, no acute distress HEENT: NCAT, EOMi, OP clear,  PULM: CTA B CV: RRR, systolic murmur RUSB, no JVD AB: BS+, soft, nontender, no hsm Ext: warm, no edema, no clubbing, no cyanosis  6 minute walk distance today is 304 m, did not desaturate less than 93% on room air  Assessment & Plan:   PAH (pulmonary artery hypertension) Kristi Barrera's 6  minute walk distance has decreased slightly since the last visit, however I believe that this is due in part to the fact that she has severe knee pain and has been unable to exercise for the last several months. She otherwise has no significant shortness of breath or other symptoms.  I believe that her pulmonary hypertension is in part related to her obstructive sleep apnea.  Plan: -We will repeat his 6 minute walk on the next visit, I'm hoping that it will be improved after her knee pain is taking care of and she will be exercising regularly again before then -If she continues to show decline in her 6 minute walk then we will refer her for a right heart catheterization -We will request a copy of the recent echocardiogram performed by her cardiologist.  OSA (obstructive sleep apnea) Continue CPAP as written  Nocturnal hypoxemia Continue 2 L of oxygen at night with CPAP    Updated Medication List Outpatient Encounter Prescriptions as of 02/14/2013  Medication Sig Dispense Refill  . ALPRAZolam (XANAX) 0.25 MG tablet Take 0.25 mg by mouth 2 (two) times daily as needed.       Marland Kitchen amLODipine (NORVASC) 10 MG tablet Take 5 mg by mouth daily.       . benazepril (LOTENSIN) 40 MG tablet Take 40 mg by mouth daily.      Marland Kitchen esomeprazole (NEXIUM) 40 MG capsule Take 40 mg by mouth as needed.       . fenofibrate micronized (LOFIBRA) 134 MG capsule Take 1 tablet by mouth Daily.      . furosemide (LASIX) 40 MG tablet As needed 1-2 times a week 20 mg      . levothyroxine (SYNTHROID) 100 MCG tablet Take 100 mcg by mouth daily.      Marland Kitchen olmesartan (BENICAR) 40 MG tablet Take 40 mg by mouth daily.      . verapamil (VERELAN PM) 240 MG 24 hr capsule Take 240 mg by mouth at bedtime.       No facility-administered encounter medications on file as of 02/14/2013.

## 2013-02-14 NOTE — Assessment & Plan Note (Signed)
Continue CPAP as written. 

## 2013-02-14 NOTE — Patient Instructions (Signed)
We will request records from Dr. Judithann Sheen office regarding your echo  Keep using your CPAP machine every night  Exercise as much as possible  We will see you back in 6 months or sooner if needed

## 2013-02-14 NOTE — Assessment & Plan Note (Signed)
Kristi Barrera's 6 minute walk distance has decreased slightly since the last visit, however I believe that this is due in part to the fact that she has severe knee pain and has been unable to exercise for the last several months. She otherwise has no significant shortness of breath or other symptoms.  I believe that her pulmonary hypertension is in part related to her obstructive sleep apnea.  Plan: -We will repeat his 6 minute walk on the next visit, I'm hoping that it will be improved after her knee pain is taking care of and she will be exercising regularly again before then -If she continues to show decline in her 6 minute walk then we will refer her for a right heart catheterization -We will request a copy of the recent echocardiogram performed by her cardiologist.

## 2013-02-14 NOTE — Assessment & Plan Note (Signed)
Continue 2 L of oxygen at night with CPAP

## 2013-07-10 ENCOUNTER — Emergency Department: Payer: Self-pay | Admitting: Emergency Medicine

## 2013-07-10 LAB — CBC
MCH: 29.2 pg (ref 26.0–34.0)
MCHC: 32.5 g/dL (ref 32.0–36.0)
RDW: 12.1 % (ref 11.5–14.5)

## 2013-07-10 LAB — COMPREHENSIVE METABOLIC PANEL
Albumin: 3.8 g/dL (ref 3.4–5.0)
BUN: 15 mg/dL (ref 7–18)
Chloride: 110 mmol/L — ABNORMAL HIGH (ref 98–107)
EGFR (Non-African Amer.): >60
Glucose: 85 mg/dL (ref 65–99)
Osmolality: 285 (ref 275–301)
SGOT(AST): 15 U/L (ref 15–37)

## 2013-10-04 ENCOUNTER — Encounter (INDEPENDENT_AMBULATORY_CARE_PROVIDER_SITE_OTHER): Payer: Self-pay

## 2013-10-04 ENCOUNTER — Ambulatory Visit (INDEPENDENT_AMBULATORY_CARE_PROVIDER_SITE_OTHER): Payer: Medicare Other | Admitting: Pulmonary Disease

## 2013-10-04 ENCOUNTER — Encounter: Payer: Self-pay | Admitting: Pulmonary Disease

## 2013-10-04 VITALS — BP 144/72 | HR 76 | Ht 62.0 in | Wt 151.0 lb

## 2013-10-04 DIAGNOSIS — I2789 Other specified pulmonary heart diseases: Secondary | ICD-10-CM

## 2013-10-04 DIAGNOSIS — I2721 Secondary pulmonary arterial hypertension: Secondary | ICD-10-CM

## 2013-10-04 DIAGNOSIS — G4733 Obstructive sleep apnea (adult) (pediatric): Secondary | ICD-10-CM

## 2013-10-04 DIAGNOSIS — D869 Sarcoidosis, unspecified: Secondary | ICD-10-CM

## 2013-10-04 NOTE — Assessment & Plan Note (Signed)
Stable interval for her quiescent sarcoidosis. No treatment indicated

## 2013-10-04 NOTE — Assessment & Plan Note (Signed)
She is doing great!  Her 6 minute walk today was a full 102 meters longer than her last visit and her oxygenation was great.  She feels no limitations in regards to her breathing.  So based on this she still doesn't need a right heart cath or treatment.  Plan: -repeat 6MW in clinic visit in 6 months

## 2013-10-04 NOTE — Progress Notes (Signed)
Patient ID: Kristi Barrera, female    DOB: [birth year]   MRN: [id number]  Synopsis: [name] was first referred to the [facility] office for pulmonary hypertension April 2013. She has a past medical history significant for sarcoidosis diagnosed by Dr. Guido Sander at [facility] in the 1980s. She has never required treatment for this. In 2013 she was found to have a PA pressure of 51 on a transthoracic echocardiogram in the setting of no symptoms and was referred to [facility] pulmonary for further evaluation. A CT scan performed in may of 2013 showed a small match defect in the right lower lobe. An overnight sleep oximetry study on room air in April 2013 showed oxygen desaturation below 88% for 11 minutes and she was given 2 L of oxygen for nocturnal use.    HPI    Subjective:   02/29/12 ROV -- Mrs. Kristi Barrera comes to our clinic today for further evaluation of pulmonary hypertension. As noted above she's been followed by Dr. Sherene Sires recently for pulmonary hypertension found on a transthoracic echocardiogram. She tells me that she has no symptoms of shortness of breath and has no limitations. She vacuums her house, goes to the grocery store, and exercises it least once a week without limitation. She does not have cough or chest pain. She states that she has occasional ankle swelling which her primary care physician has attributed to amlodipine. She states that she does often wake up gasping for air in the middle the night and frequently has daytime somnolence. Typically she wakes up in the middle of a dream.  08/16/12 ROV --Molina has been doing great since her last visit. She is exercising 2-3 times a week by walking 1-1/2 miles a time. She states she walks at a brisk pace with a friend and experiences no shortness of breath with this exertion. She has been using her CPAP every night for 9 hours and says that she feels well rested throughout the day. She does not have to nap during the day. She denies cough or any  shortness of breath of any kind. She is able to perform all of her activities of daily living without difficulty. She does not have leg swelling. She denies chest pain. She does note some sinus congestion in the mornings after using CPAP via nasal pillows all night long.  02/14/2013 ROV >>  Mrs. Kristi Barrera has been doing well since her last visit from a breathing standpoint. Unfortunately, she has not been able to exercise much because of severe knee pain. She is receiving injections in her knee and hopes to start exercising again in 2-3 weeks. She's not had cough, or shortness of breath with any activity. She continues to use and benefit from her CPAP machine every night. She continues to use and benefit from her oxygen at night.  10/04/2013 ROV >> Mrs. Kristi Barrera has been doing well without shortness of breath.  Her knee feels a lot better.  She continues to exercise regularly with weights and vacuuming without dyspnea.  She has some sinus congestion after using the CPAP machine in the mornngs.  Claritin helps with this.  Past Medical History  Diagnosis Date  . GERD (gastroesophageal reflux disease)   . Palpitations   . Hypothyroidism   . HTN (hypertension)   . Hyperlipidemia   . Chest pain   . Sinusitis   . Recurrent UTI   . Chronic low back pain   . Sarcoidosis     [place]---[facility] 15-49yrs ago  . Skin  cancer         Review of Systems  Constitutional: Negative for fever, diaphoresis and fatigue.  HENT: Negative for congestion, nosebleeds, postnasal drip and rhinorrhea.   Respiratory: Negative for cough, chest tightness, shortness of breath and stridor.   Cardiovascular: Negative for chest pain and leg swelling.     Physical Exam   Filed Vitals:   10/04/13 1359  BP: 144/72  Pulse: 76  Height: 5\' 2"  (1.575 m)  Weight: 151 lb (68.493 kg)  SpO2: 99%  RA  Gen: well appearing, no acute distress HEENT: NCAT, EOMi, OP clear,  PULM: Few crackles RLL, otherwise clear CV: RRR, systolic  murmur RUSB, no JVD AB: BS+, soft, nontender, no hsm Ext: warm, no edema, no clubbing, no cyanosis  -01/2013 6 minute walk distance today is 304 m, did not desaturate less than 93% on room air - 6 MW 10/04/2013 > 405 m O2 saturation no lower than 98% on RA  Assessment & Plan:   PAH (pulmonary artery hypertension) She is doing great!  Her 6 minute walk today was a full 102 meters longer than her last visit and her oxygenation was great.  She feels no limitations in regards to her breathing.  So based on this she still doesn't need a right heart cath or treatment.  Plan: -repeat in clinic visit in 6 months  Sarcoidosis Stable interval for her quiescent sarcoidosis. No treatment indicated  OSA (obstructive sleep apnea) She has been doing well since the last visit.  She continues to use and benefit from her CPAP.  She would do better with heated humidity as she has some nasal congestion in the mornings.  For now she prefers to keep her machine as is.  Continue CPAP Continue 2L qHS    Updated Medication List Outpatient Encounter Prescriptions as of 10/04/2013  Medication Sig  . ALPRAZolam (XANAX) 0.25 MG tablet Take 0.25 mg by mouth 2 (two) times daily as needed.   Marland Kitchen amLODipine (NORVASC) 10 MG tablet Take 5 mg by mouth daily.   . benazepril (LOTENSIN) 40 MG tablet Take 40 mg by mouth daily.  . cyanocobalamin 500 MCG tablet Take 500 mcg by mouth daily.  Marland Kitchen esomeprazole (NEXIUM) 40 MG capsule Take 40 mg by mouth as needed.   . fenofibrate micronized (LOFIBRA) 134 MG capsule Take 1 tablet by mouth Daily.  . furosemide (LASIX) 40 MG tablet As needed 1-2 times a week 20 mg  . levothyroxine (SYNTHROID) 100 MCG tablet Take 100 mcg by mouth daily.  Marland Kitchen olmesartan (BENICAR) 40 MG tablet Take 40 mg by mouth daily.  . verapamil (VERELAN PM) 240 MG 24 hr capsule Take 240 mg by mouth at bedtime.

## 2013-10-04 NOTE — Assessment & Plan Note (Signed)
She has been doing well since the last visit.  She continues to use and benefit from her CPAP.  She would do better with heated humidity as she has some nasal congestion in the mornings.  For now she prefers to keep her machine as is.  Continue CPAP Continue 2L qHS

## 2013-10-04 NOTE — Patient Instructions (Signed)
If you want the heated humidified form of CPAP, use the prescription I gave you today Otherwise, continue using CPAP every night Continue to stay active with your daily exercises We will see you back in 6 months or sooner if needed

## 2014-02-05 ENCOUNTER — Ambulatory Visit: Payer: Self-pay | Admitting: Internal Medicine

## 2014-07-02 ENCOUNTER — Ambulatory Visit: Payer: Medicare Other | Admitting: Pulmonary Disease

## 2014-07-24 ENCOUNTER — Encounter: Payer: Self-pay | Admitting: Pulmonary Disease

## 2014-07-24 ENCOUNTER — Ambulatory Visit (INDEPENDENT_AMBULATORY_CARE_PROVIDER_SITE_OTHER): Payer: Medicare Other | Admitting: Pulmonary Disease

## 2014-07-24 VITALS — BP 162/68 | HR 92 | Ht 62.0 in | Wt 156.0 lb

## 2014-07-24 DIAGNOSIS — I2721 Secondary pulmonary arterial hypertension: Secondary | ICD-10-CM

## 2014-07-24 DIAGNOSIS — G4734 Idiopathic sleep related nonobstructive alveolar hypoventilation: Secondary | ICD-10-CM

## 2014-07-24 DIAGNOSIS — G4733 Obstructive sleep apnea (adult) (pediatric): Secondary | ICD-10-CM

## 2014-07-24 DIAGNOSIS — D869 Sarcoidosis, unspecified: Secondary | ICD-10-CM

## 2014-07-24 DIAGNOSIS — I272 Other secondary pulmonary hypertension: Secondary | ICD-10-CM

## 2014-07-24 NOTE — Progress Notes (Signed)
Patient ID: Kristi Barrera, female    DOB: 1940/08/15   MRN: [id number]  Synopsis: [name] was first referred to the [facility] office for pulmonary hypertension April 2013. She has a past medical history significant for sarcoidosis diagnosed by Dr. Guido Sander at [facility] in the 1980s. She has never required treatment for this. In 2013 she was found to have a PA pressure of 51 on a transthoracic echocardiogram in the setting of no symptoms and was referred to [facility] pulmonary for further evaluation. A CT scan performed in may of 2013 showed a small match defect in the right lower lobe. An overnight sleep oximetry study on room air in April 2013 showed oxygen desaturation below 88% for 11 minutes and she was given 2 L of oxygen for nocturnal use.    HPI   Subjective:   Chief Complaint  Patient presents with  . Follow-up    pt recovering from sinus infection; still has drainage (clear). Wears cpap 8 hours with 2L 02 nightly.   [name] had a sinus infection recently.  NO breathing problems.  Using her CPAP and oxygen and that is going OK. She remains active and has not problems with breathing.  She doesn't exercise much due to her knee pain.  She has been taking prednisone 5mg  daily.  She thinks it is causing alopecia.  No cough, no vision changes apart from chronic "floaters in eyes".  Past Medical History  Diagnosis Date  . GERD (gastroesophageal reflux disease)   . Palpitations   . Hypothyroidism   . HTN (hypertension)   . Hyperlipidemia   . Chest pain   . Sinusitis   . Recurrent UTI   . Chronic low back pain   . Sarcoidosis     Barrera---[facility] 15-30yrs ago  . Skin cancer         Review of Systems  Constitutional: Negative for fever, diaphoresis and fatigue.  HENT: Negative for congestion, nosebleeds, postnasal drip and rhinorrhea.   Respiratory: Negative for cough, chest tightness, shortness of breath and stridor.   Cardiovascular: Negative for chest pain and leg  swelling.     Physical Exam  Filed Vitals:   07/24/14 1503  BP: 162/68  Pulse: 92  Height: 5\' 2"  (1.575 m)  Weight: 156 lb (70.761 kg)  SpO2: 99%  RA  Gen: well appearing, no acute distress HEENT: NCAT, EOMi, OP clear,  PULM: Again, Few crackles RLL, otherwise clear CV: RRR, systolic murmur RUSB, no JVD AB: BS+, soft, nontender, no hsm Ext: warm, no edema, no clubbing, no cyanosis  -01/2013 6 minute walk distance is 304 m, did not desaturate less than 93% on room air - 6 MW 10/04/2013 > 405 m O2 saturation no lower than 98% on RA -6 MW January 2016> 409 m, O2 saturation no lower than 99% on room air.  Assessment & Plan:   PAH (pulmonary artery hypertension) Tatumn's condition remained stable and her 6 minute walk numbers remain excellent. She has not had evidence of progression of her disease which is quite mild.  Plan: -Continue six-month monitoring -Continue to treat sleep apnea  Sarcoidosis This has been a stable interval for [place]. She has not had an exacerbation and her 6 minute walk distance remains outstanding as detailed above. No intervention needed at this time.  OSA (obstructive sleep apnea) She has been doing well with CPAP therapy. She will continue taking it at night with oxygen.  Nocturnal hypoxemia She continues to use and benefit from oxygen  therapy with CPAP at night. She will continue using this.    Updated Medication List Outpatient Encounter Prescriptions as of 07/24/2014  Medication Sig  . ALPRAZolam (XANAX) 0.25 MG tablet Take 0.25 mg by mouth 2 (two) times daily as needed.   Marland Kitchen amLODipine (NORVASC) 10 MG tablet Take 5 mg by mouth daily.   . benazepril (LOTENSIN) 40 MG tablet Take 40 mg by mouth daily.  . cyanocobalamin 500 MCG tablet Take 500 mcg by mouth daily.  Marland Kitchen esomeprazole (NEXIUM) 40 MG capsule Take 40 mg by mouth as needed.   . fenofibrate micronized (LOFIBRA) 134 MG capsule Take 1 tablet by mouth Daily.  . furosemide (LASIX) 40 MG  tablet As needed 1-2 times a week 20 mg  . levothyroxine (SYNTHROID) 100 MCG tablet Take 100 mcg by mouth daily.  Marland Kitchen olmesartan (BENICAR) 40 MG tablet Take 40 mg by mouth daily.  . predniSONE (DELTASONE) 10 MG tablet Take 10 mg by mouth daily.  . verapamil (VERELAN PM) 240 MG 24 hr capsule Take 240 mg by mouth at bedtime.

## 2014-07-24 NOTE — Assessment & Plan Note (Signed)
Kristi Barrera's condition remained stable and her 6 minute walk numbers remain excellent. She has not had evidence of progression of her disease which is quite mild.  Plan: -Continue six-month monitoring -Continue to treat sleep apnea

## 2014-07-24 NOTE — Assessment & Plan Note (Signed)
She has been doing well with CPAP therapy. She will continue taking it at night with oxygen.

## 2014-07-24 NOTE — Patient Instructions (Signed)
Keep using your CPAP machine with oxygen at night.  We will see you back in 6 months or sooner if needed

## 2014-07-24 NOTE — Assessment & Plan Note (Signed)
This has been a stable interval for [place]. She has not had an exacerbation and her 6 minute walk distance remains outstanding as detailed above. No intervention needed at this time.

## 2014-07-24 NOTE — Assessment & Plan Note (Signed)
She continues to use and benefit from oxygen therapy with CPAP at night. She will continue using this.

## 2015-01-23 ENCOUNTER — Other Ambulatory Visit: Payer: Self-pay | Admitting: Internal Medicine

## 2015-01-23 DIAGNOSIS — Z1231 Encounter for screening mammogram for malignant neoplasm of breast: Secondary | ICD-10-CM

## 2015-02-07 ENCOUNTER — Ambulatory Visit
Admission: RE | Admit: 2015-02-07 | Discharge: 2015-02-07 | Disposition: A | Discharge status: Home / Self Care | Payer: Medicare Other | Source: Ambulatory Visit | Attending: Internal Medicine | Admitting: Internal Medicine

## 2015-02-07 ENCOUNTER — Other Ambulatory Visit: Payer: Self-pay | Admitting: Internal Medicine

## 2015-02-07 DIAGNOSIS — Z1231 Encounter for screening mammogram for malignant neoplasm of breast: Secondary | ICD-10-CM | POA: Insufficient documentation

## 2015-03-03 ENCOUNTER — Other Ambulatory Visit: Payer: Self-pay

## 2015-03-03 ENCOUNTER — Emergency Department: Payer: Medicare Other

## 2015-03-03 ENCOUNTER — Emergency Department
Admission: EM | Admit: 2015-03-03 | Discharge: 2015-03-03 | Disposition: A | Discharge status: Home / Self Care | Payer: Medicare Other | Attending: Emergency Medicine | Admitting: Emergency Medicine

## 2015-03-03 ENCOUNTER — Encounter: Payer: Self-pay | Admitting: Emergency Medicine

## 2015-03-03 DIAGNOSIS — Z79899 Other long term (current) drug therapy: Secondary | ICD-10-CM | POA: Insufficient documentation

## 2015-03-03 DIAGNOSIS — R079 Chest pain, unspecified: Secondary | ICD-10-CM

## 2015-03-03 DIAGNOSIS — I10 Essential (primary) hypertension: Secondary | ICD-10-CM | POA: Insufficient documentation

## 2015-03-03 DIAGNOSIS — K219 Gastro-esophageal reflux disease without esophagitis: Secondary | ICD-10-CM | POA: Diagnosis not present

## 2015-03-03 DIAGNOSIS — Z7952 Long term (current) use of systemic steroids: Secondary | ICD-10-CM | POA: Diagnosis not present

## 2015-03-03 HISTORY — DX: Vitamin D deficiency, unspecified: E55.9

## 2015-03-03 HISTORY — DX: Hypokalemia: E87.6

## 2015-03-03 LAB — BASIC METABOLIC PANEL
Anion gap: 9 (ref 5–15)
BUN: 23 mg/dL — AB (ref 6–20)
CALCIUM: 9.4 mg/dL (ref 8.9–10.3)
CHLORIDE: 106 mmol/L (ref 101–111)
CO2: 27 mmol/L (ref 22–32)
CREATININE: 0.95 mg/dL (ref 0.44–1.00)
GFR, EST NON AFRICAN AMERICAN: 59 mL/min — AB (ref >60)
GLUCOSE: 118 mg/dL — AB (ref 65–99)
POTASSIUM: 3.3 mmol/L — AB (ref 3.5–5.1)
Sodium: 142 mmol/L (ref 135–145)

## 2015-03-03 LAB — CBC
HCT: 38.9 % (ref 35.0–47.0)
Hemoglobin: 12.9 g/dL (ref 12.0–16.0)
MCH: 29.7 pg (ref 26.0–34.0)
MCHC: 33.1 g/dL (ref 32.0–36.0)
MCV: 89.6 fL (ref 80.0–100.0)
Platelets: 262 10*3/uL (ref 150–440)
RBC: 4.34 MIL/uL (ref 3.80–5.20)
RDW: 12.6 % (ref 11.5–14.5)
WBC: 11.1 10*3/uL — AB (ref 3.6–11.0)

## 2015-03-03 LAB — TROPONIN I

## 2015-03-03 MED ORDER — GI COCKTAIL ~~LOC~~
ORAL | Status: AC
  Administered 2015-03-03: 30 mL via ORAL
  Filled 2015-03-03: qty 30

## 2015-03-03 MED ORDER — RANITIDINE HCL 150 MG PO TABS: 150.0000 mg | ORAL_TABLET | Freq: Two times a day (BID) | ORAL | Status: DC

## 2015-03-03 MED ORDER — GI COCKTAIL ~~LOC~~
30.0000 mL | Freq: Once | ORAL | Status: AC
  Administered 2015-03-03: 30 mL via ORAL

## 2015-03-03 NOTE — ED Provider Notes (Signed)
[facility] Emergency Department Provider Note    ____________________________________________  Time seen: 57  I have reviewed the triage vital signs and the nursing notes.   HISTORY  Chief Complaint Chest Pain   History limited by: Not Limited   HPI Kristi Barrera is a 74 y.o. female  2 presents to the emergency department today because of concerns for chest pain. The patient states that the pain was located under her left breast. She states that it started yesterday roughly 24 hours ago. The patient states that she had taken a potassium tablet prior to the pain starting. This is only the second time she took this tablet. She states that the pain is sharp. It is intermittent. It did radiate once to the back however no significant radiation today. She denies any fevers. Denies any shortness of breath. No nausea or vomiting.   Past Medical History  Diagnosis Date  . GERD (gastroesophageal reflux disease)   . Palpitations   . Hypothyroidism   . HTN (hypertension)   . Hyperlipidemia   . Chest pain   . Sinusitis   . Recurrent UTI   . Chronic low back pain   . Sarcoidosis     [place]---[facility] 15-75yrs ago  . Skin cancer   . Low blood potassium   . Vitamin D deficiency     Patient Active Problem List   Diagnosis Date Noted  . Nocturnal hypoxemia 02/14/2013  . OSA (obstructive sleep apnea) 02/29/2012  . PAH (pulmonary artery hypertension) 11/17/2011  . Sarcoidosis 11/17/2011    Past Surgical History  Procedure Laterality Date  . Breast cyst aspiration Left     neg    Current Outpatient Rx  Name  Route  Sig  Dispense  Refill  . ALPRAZolam (XANAX) 0.25 MG tablet   Oral   Take 0.25 mg by mouth 2 (two) times daily as needed.          Marland Kitchen amLODipine (NORVASC) 10 MG tablet   Oral   Take 5 mg by mouth daily.          . benazepril (LOTENSIN) 40 MG tablet   Oral   Take 40 mg by mouth daily.         . cyanocobalamin 500 MCG  tablet   Oral   Take 500 mcg by mouth daily.         Marland Kitchen esomeprazole (NEXIUM) 40 MG capsule   Oral   Take 40 mg by mouth as needed.          . fenofibrate micronized (LOFIBRA) 134 MG capsule   Oral   Take 1 tablet by mouth Daily.         . furosemide (LASIX) 40 MG tablet      As needed 1-2 times a week 20 mg         . levothyroxine (SYNTHROID) 100 MCG tablet   Oral   Take 100 mcg by mouth daily.         Marland Kitchen olmesartan (BENICAR) 40 MG tablet   Oral   Take 40 mg by mouth daily.         . predniSONE (DELTASONE) 10 MG tablet   Oral   Take 10 mg by mouth daily.         . verapamil (VERELAN PM) 240 MG 24 hr capsule   Oral   Take 240 mg by mouth at bedtime.           Allergies Clonidine derivatives;  Floxin; Hydralazine hcl; Iohexol; Lotrel; Sulfa antibiotics; and Toprol xl  Family History  Problem Relation Age of Onset  . Heart disease Father     MI  . Stroke Father   . Stroke Mother   . Rheum arthritis Mother     Social History Social History  Substance Use Topics  . Smoking status: Never Smoker   . Smokeless tobacco: Never Used  . Alcohol Use: No    Review of Systems  Constitutional: Negative for fever. Cardiovascular: Left sided chest pain Respiratory: Negative for shortness of breath. Gastrointestinal: Negative for abdominal pain, vomiting and diarrhea. Genitourinary: Negative for dysuria. Musculoskeletal: Negative for back pain. Skin: Negative for rash. Neurological: Negative for headaches, focal weakness or numbness.  10-point ROS otherwise negative.  ____________________________________________   PHYSICAL EXAM:  VITAL SIGNS: ED Triage Vitals  Enc Vitals Group     BP 03/03/15 1452 171/57 mmHg     Pulse Rate 03/03/15 1452 79     Resp 03/03/15 1452 16     Temp 03/03/15 1452 97.6 F (36.4 C)     Temp Source 03/03/15 1452 Oral     SpO2 03/03/15 1452 97 %     Weight 03/03/15 1452 155 lb (70.308 kg)     Height 03/03/15 1452 5'  2" (1.575 m)     Head Cir --      Peak Flow --      Pain Score 03/03/15 1449 8   Constitutional: Alert and oriented. Well appearing and in no distress. Eyes: Conjunctivae are normal. PERRL. Normal extraocular movements. ENT   Head: Normocephalic and atraumatic.   Nose: No congestion/rhinnorhea.   Mouth/Throat: Mucous membranes are moist.   Neck: No stridor. Hematological/Lymphatic/Immunilogical: No cervical lymphadenopathy. Cardiovascular: Normal rate, regular rhythm.  No murmurs, rubs, or gallops. Respiratory: Normal respiratory effort without tachypnea nor retractions. Breath sounds are clear and equal bilaterally. No wheezes/rales/rhonchi. Gastrointestinal: Soft and nontender. No distention.  Genitourinary: Deferred Musculoskeletal: Normal range of motion in all extremities. No joint effusions.  No lower extremity tenderness nor edema. Neurologic:  Normal speech and language. No gross focal neurologic deficits are appreciated. Speech is normal.  Skin:  Skin is warm, dry and intact. No rash noted. Psychiatric: Mood and affect are normal. Speech and behavior are normal. Patient exhibits appropriate insight and judgment.  ____________________________________________    LABS (pertinent positives/negatives)  Labs Reviewed  BASIC METABOLIC PANEL - Abnormal; Notable for the following:    Potassium 3.3 (*)    Glucose, Bld 118 (*)    BUN 23 (*)    GFR calc non Af Amer 59 (*)    All other components within normal limits  CBC - Abnormal; Notable for the following:    WBC 11.1 (*)    All other components within normal limits  TROPONIN I  TROPONIN I     ____________________________________________   EKG  I, Nance Pear, attending physician, personally viewed and interpreted this EKG  EKG Time: 1449 Rate: 79 Rhythm: NSR Axis: normal Intervals: qtc 419 QRS: narrow ST changes: no st elevation  ____________________________________________     RADIOLOGY  CXR  IMPRESSION: No active disease.  ____________________________________________   PROCEDURES  Procedure(s) performed: None  Critical Care performed: No  ____________________________________________   INITIAL IMPRESSION / ASSESSMENT AND PLAN / ED COURSE  Pertinent labs & imaging results that were available during my care of the patient were reviewed by me and considered in my medical decision making (see chart for details).  Patient presented to  the emergency department today because of concerns for chest pain. Patient had 2 sets of troponin which were negative. Think ACS unlikely given the negative troponin and sharp nature pain. Patient did state that the GI cocktail helped somewhat so I think gastritis mass reflux a likely. I did discuss with patient importance of following up with primary care doctor.  ____________________________________________   FINAL CLINICAL IMPRESSION(S) / ED DIAGNOSES  Final diagnoses:  Gastroesophageal reflux disease, esophagitis presence not specified  Chest pain, unspecified chest pain type     Nance Pear, MD 03/03/15 2108

## 2015-03-03 NOTE — ED Notes (Signed)
Pt reports left sided chest pain, reports pain runs right under left breast. Pt also reports pain radiates to axillary area and middle of back. Pt denies nausea, vomiting or shortness of breath. Pt reports pain started around 1600 yesterday afternoon. Pt alert and oriented in room, no distress noted.

## 2015-03-03 NOTE — Discharge Instructions (Signed)
Please seek medical attention for any high fevers, chest pain, shortness of breath, change in behavior, persistent vomiting, bloody stool or any other new or concerning symptoms.   Chest Pain (Nonspecific) It is often hard to give a specific diagnosis for the cause of chest pain. There is always a chance that your pain could be related to something serious, such as a heart attack or a blood clot in the lungs. You need to follow up with your health care provider for further evaluation. CAUSES   Heartburn.  Pneumonia or bronchitis.  Anxiety or stress.  Inflammation around your heart (pericarditis) or lung (pleuritis or pleurisy).  A blood clot in the lung.  A collapsed lung (pneumothorax). It can develop suddenly on its own (spontaneous pneumothorax) or from trauma to the chest.  Shingles infection (herpes zoster virus). The chest wall is composed of bones, muscles, and cartilage. Any of these can be the source of the pain.  The bones can be bruised by injury.  The muscles or cartilage can be strained by coughing or overwork.  The cartilage can be affected by inflammation and become sore (costochondritis). DIAGNOSIS  Lab tests or other studies may be needed to find the cause of your pain. Your health care provider may have you take a test called an ambulatory electrocardiogram (ECG). An ECG records your heartbeat patterns over a 24-hour period. You may also have other tests, such as:  Transthoracic echocardiogram (TTE). During echocardiography, sound waves are used to evaluate how blood flows through your heart.  Transesophageal echocardiogram (TEE).  Cardiac monitoring. This allows your health care provider to monitor your heart rate and rhythm in real time.  Holter monitor. This is a portable device that records your heartbeat and can help diagnose heart arrhythmias. It allows your health care provider to track your heart activity for several days, if needed.  Stress tests by  exercise or by giving medicine that makes the heart beat faster. TREATMENT   Treatment depends on what may be causing your chest pain. Treatment may include:  Acid blockers for heartburn.  Anti-inflammatory medicine.  Pain medicine for inflammatory conditions.  Antibiotics if an infection is present.  You may be advised to change lifestyle habits. This includes stopping smoking and avoiding alcohol, caffeine, and chocolate.  You may be advised to keep your head raised (elevated) when sleeping. This reduces the chance of acid going backward from your stomach into your esophagus. Most of the time, nonspecific chest pain will improve within 2-3 days with rest and mild pain medicine.  HOME CARE INSTRUCTIONS   If antibiotics were prescribed, take them as directed. Finish them even if you start to feel better.  For the next few days, avoid physical activities that bring on chest pain. Continue physical activities as directed.  Do not use any tobacco products, including cigarettes, chewing tobacco, or electronic cigarettes.  Avoid drinking alcohol.  Only take medicine as directed by your health care provider.  Follow your health care provider's suggestions for further testing if your chest pain does not go away.  Keep any follow-up appointments you made. If you do not go to an appointment, you could develop lasting (chronic) problems with pain. If there is any problem keeping an appointment, call to reschedule. SEEK MEDICAL CARE IF:   Your chest pain does not go away, even after treatment.  You have a rash with blisters on your chest.  You have a fever. SEEK IMMEDIATE MEDICAL CARE IF:   You have increased  chest pain or pain that spreads to your arm, neck, jaw, back, or abdomen.  You have shortness of breath.  You have an increasing cough, or you cough up blood.  You have severe back or abdominal pain.  You feel nauseous or vomit.  You have severe weakness.  You  faint.  You have chills. This is an emergency. Do not wait to see if the pain will go away. Get medical help at once. Call your local emergency services (911 in [place] Do not drive yourself to the hospital. MAKE SURE YOU:   Understand these instructions.  Will watch your condition.  Will get help right away if you are not doing well or get worse. Document Released: 04/15/2005 Document Revised: 07/11/2013 Document Reviewed: 02/09/2008 [facility] Patient Information 2015 [place], [place]. This information is not intended to replace advice given to you by your health care provider. Make sure you discuss any questions you have with your health care provider.  Gastroesophageal Reflux Disease, Adult Gastroesophageal reflux disease (GERD) happens when acid from your stomach flows up into the esophagus. When acid comes in contact with the esophagus, the acid causes soreness (inflammation) in the esophagus. Over time, GERD may create small holes (ulcers) in the lining of the esophagus. CAUSES   Increased body weight. This puts pressure on the stomach, making acid rise from the stomach into the esophagus.  Smoking. This increases acid production in the stomach.  Drinking alcohol. This causes decreased pressure in the lower esophageal sphincter (valve or ring of muscle between the esophagus and stomach), allowing acid from the stomach into the esophagus.  Late evening meals and a full stomach. This increases pressure and acid production in the stomach.  A malformed lower esophageal sphincter. Sometimes, no cause is found. SYMPTOMS   Burning pain in the lower part of the mid-chest behind the breastbone and in the mid-stomach area. This may occur twice a week or more often.  Trouble swallowing.  Sore throat.  Dry cough.  Asthma-like symptoms including chest tightness, shortness of breath, or wheezing. DIAGNOSIS  Your caregiver may be able to diagnose GERD based on your symptoms. In some  cases, X-rays and other tests may be done to check for complications or to check the condition of your stomach and esophagus. TREATMENT  Your caregiver may recommend over-the-counter or prescription medicines to help decrease acid production. Ask your caregiver before starting or adding any new medicines.  HOME CARE INSTRUCTIONS   Change the factors that you can control. Ask your caregiver for guidance concerning weight loss, quitting smoking, and alcohol consumption.  Avoid foods and drinks that make your symptoms worse, such as:  Caffeine or alcoholic drinks.  Chocolate.  Peppermint or mint flavorings.  Garlic and onions.  Spicy foods.  Citrus fruits, such as oranges, lemons, or limes.  Tomato-based foods such as sauce, chili, salsa, and pizza.  Fried and fatty foods.  Avoid lying down for the 3 hours prior to your bedtime or prior to taking a nap.  Eat small, frequent meals instead of large meals.  Wear loose-fitting clothing. Do not wear anything tight around your waist that causes pressure on your stomach.  Raise the head of your bed 6 to 8 inches with wood blocks to help you sleep. Extra pillows will not help.  Only take over-the-counter or prescription medicines for pain, discomfort, or fever as directed by your caregiver.  Do not take aspirin, ibuprofen, or other nonsteroidal anti-inflammatory drugs (NSAIDs). SEEK IMMEDIATE MEDICAL CARE IF:  You have pain in your arms, neck, jaw, teeth, or back.  Your pain increases or changes in intensity or duration.  You develop nausea, vomiting, or sweating (diaphoresis).  You develop shortness of breath, or you faint.  Your vomit is green, yellow, black, or looks like coffee grounds or blood.  Your stool is red, bloody, or black. These symptoms could be signs of other problems, such as heart disease, gastric bleeding, or esophageal bleeding. MAKE SURE YOU:   Understand these instructions.  Will watch your  condition.  Will get help right away if you are not doing well or get worse. Document Released: 04/15/2005 Document Revised: 09/28/2011 Document Reviewed: 01/23/2011 [facility] Patient Information 2015 [place], [place]. This information is not intended to replace advice given to you by your health care provider. Make sure you discuss any questions you have with your health care provider.

## 2015-03-03 NOTE — ED Notes (Signed)
Patient given a GI coctail. Will continue to monitor.

## 2015-03-03 NOTE — ED Notes (Signed)
Patient began having chest pain/indigestion yesterday. Has hx of GERD. Began potassium on Friday and thinks it could be that? Pain comes and goes, it is under left breast. Began as her regular indigestion. Patient does not take any RX for GERD, using just tums.

## 2015-08-02 ENCOUNTER — Encounter: Payer: Self-pay | Admitting: Emergency Medicine

## 2015-08-02 ENCOUNTER — Inpatient Hospital Stay
Admission: EM | Admit: 2015-08-02 | Discharge: 2015-08-07 | DRG: 394 | Disposition: A | Discharge status: Home / Self Care | Payer: Medicare Other | Attending: Internal Medicine | Admitting: Internal Medicine

## 2015-08-02 DIAGNOSIS — Z87440 Personal history of urinary (tract) infections: Secondary | ICD-10-CM | POA: Diagnosis not present

## 2015-08-02 DIAGNOSIS — K921 Melena: Secondary | ICD-10-CM | POA: Diagnosis present

## 2015-08-02 DIAGNOSIS — Z79899 Other long term (current) drug therapy: Secondary | ICD-10-CM

## 2015-08-02 DIAGNOSIS — E781 Pure hyperglyceridemia: Secondary | ICD-10-CM | POA: Diagnosis present

## 2015-08-02 DIAGNOSIS — K922 Gastrointestinal hemorrhage, unspecified: Secondary | ICD-10-CM | POA: Diagnosis present

## 2015-08-02 DIAGNOSIS — Z7952 Long term (current) use of systemic steroids: Secondary | ICD-10-CM

## 2015-08-02 DIAGNOSIS — Z823 Family history of stroke: Secondary | ICD-10-CM

## 2015-08-02 DIAGNOSIS — K559 Vascular disorder of intestine, unspecified: Secondary | ICD-10-CM | POA: Diagnosis present

## 2015-08-02 DIAGNOSIS — E559 Vitamin D deficiency, unspecified: Secondary | ICD-10-CM | POA: Diagnosis present

## 2015-08-02 DIAGNOSIS — K219 Gastro-esophageal reflux disease without esophagitis: Secondary | ICD-10-CM | POA: Diagnosis present

## 2015-08-02 DIAGNOSIS — Z91041 Radiographic dye allergy status: Secondary | ICD-10-CM

## 2015-08-02 DIAGNOSIS — R1084 Generalized abdominal pain: Secondary | ICD-10-CM

## 2015-08-02 DIAGNOSIS — E039 Hypothyroidism, unspecified: Secondary | ICD-10-CM | POA: Diagnosis present

## 2015-08-02 DIAGNOSIS — I10 Essential (primary) hypertension: Secondary | ICD-10-CM | POA: Diagnosis present

## 2015-08-02 DIAGNOSIS — I773 Arterial fibromuscular dysplasia: Secondary | ICD-10-CM | POA: Diagnosis present

## 2015-08-02 DIAGNOSIS — R197 Diarrhea, unspecified: Secondary | ICD-10-CM

## 2015-08-02 DIAGNOSIS — E876 Hypokalemia: Secondary | ICD-10-CM | POA: Diagnosis present

## 2015-08-02 DIAGNOSIS — F419 Anxiety disorder, unspecified: Secondary | ICD-10-CM | POA: Diagnosis present

## 2015-08-02 DIAGNOSIS — Z800 Family history of malignant neoplasm of digestive organs: Secondary | ICD-10-CM | POA: Diagnosis not present

## 2015-08-02 DIAGNOSIS — I700 Atherosclerosis of aorta: Secondary | ICD-10-CM | POA: Diagnosis present

## 2015-08-02 DIAGNOSIS — K633 Ulcer of intestine: Secondary | ICD-10-CM | POA: Diagnosis present

## 2015-08-02 DIAGNOSIS — Z8249 Family history of ischemic heart disease and other diseases of the circulatory system: Secondary | ICD-10-CM | POA: Diagnosis not present

## 2015-08-02 LAB — COMPREHENSIVE METABOLIC PANEL
ALT: 20 U/L (ref 14–54)
AST: 19 U/L (ref 15–41)
Albumin: 4.1 g/dL (ref 3.5–5.0)
Alkaline Phosphatase: 54 U/L (ref 38–126)
Anion gap: 9 (ref 5–15)
BUN: 15 mg/dL (ref 6–20)
CALCIUM: 8.8 mg/dL — AB (ref 8.9–10.3)
CHLORIDE: 104 mmol/L (ref 101–111)
CO2: 24 mmol/L (ref 22–32)
CREATININE: 0.76 mg/dL (ref 0.44–1.00)
Glucose, Bld: 116 mg/dL — ABNORMAL HIGH (ref 65–99)
Potassium: 3.4 mmol/L — ABNORMAL LOW (ref 3.5–5.1)
Sodium: 137 mmol/L (ref 135–145)
Total Bilirubin: 0.5 mg/dL (ref 0.3–1.2)
Total Protein: 7 g/dL (ref 6.5–8.1)

## 2015-08-02 LAB — CBC
HCT: 39.7 % (ref 35.0–47.0)
Hemoglobin: 13.3 g/dL (ref 12.0–16.0)
MCH: 29.6 pg (ref 26.0–34.0)
MCHC: 33.6 g/dL (ref 32.0–36.0)
MCV: 88.1 fL (ref 80.0–100.0)
PLATELETS: 255 10*3/uL (ref 150–440)
RBC: 4.51 MIL/uL (ref 3.80–5.20)
RDW: 12.1 % (ref 11.5–14.5)
WBC: 12.5 10*3/uL — AB (ref 3.6–11.0)

## 2015-08-02 LAB — GLUCOSE, CAPILLARY: Glucose-Capillary: 121 mg/dL — ABNORMAL HIGH (ref 65–99)

## 2015-08-02 LAB — GASTROINTESTINAL PANEL BY PCR, STOOL (REPLACES STOOL CULTURE)
Adenovirus F40/41: NOT DETECTED
Adenovirus F40/41: NOT DETECTED
Astrovirus: NOT DETECTED
Astrovirus: NOT DETECTED
CAMPYLOBACTER SPECIES: NOT DETECTED
CRYPTOSPORIDIUM: NOT DETECTED
CRYPTOSPORIDIUM: NOT DETECTED
CYCLOSPORA CAYETANENSIS: NOT DETECTED
Campylobacter species: NOT DETECTED
Cyclospora cayetanensis: NOT DETECTED
E. COLI O157: NOT DETECTED
E. COLI O157: NOT DETECTED
ENTAMOEBA HISTOLYTICA: NOT DETECTED
ENTAMOEBA HISTOLYTICA: NOT DETECTED
ENTEROAGGREGATIVE E COLI (EAEC): NOT DETECTED
ENTEROAGGREGATIVE E COLI (EAEC): NOT DETECTED
Enteropathogenic E coli (EPEC): NOT DETECTED
Enteropathogenic E coli (EPEC): NOT DETECTED
Enterotoxigenic E coli (ETEC): NOT DETECTED
Enterotoxigenic E coli (ETEC): NOT DETECTED
GIARDIA LAMBLIA: NOT DETECTED
GIARDIA LAMBLIA: NOT DETECTED
NOROVIRUS GI/GII: NOT DETECTED
Norovirus GI/GII: NOT DETECTED
PLESIMONAS SHIGELLOIDES: NOT DETECTED
Plesimonas shigelloides: NOT DETECTED
Rotavirus A: NOT DETECTED
Rotavirus A: NOT DETECTED
SALMONELLA SPECIES: NOT DETECTED
SALMONELLA SPECIES: NOT DETECTED
SAPOVIRUS (I, II, IV, AND V): NOT DETECTED
SAPOVIRUS (I, II, IV, AND V): NOT DETECTED
SHIGELLA/ENTEROINVASIVE E COLI (EIEC): NOT DETECTED
SHIGELLA/ENTEROINVASIVE E COLI (EIEC): NOT DETECTED
Shiga like toxin producing E coli (STEC): NOT DETECTED
Shiga like toxin producing E coli (STEC): NOT DETECTED
VIBRIO CHOLERAE: NOT DETECTED
VIBRIO CHOLERAE: NOT DETECTED
Vibrio species: NOT DETECTED
Vibrio species: NOT DETECTED
YERSINIA ENTEROCOLITICA: NOT DETECTED
YERSINIA ENTEROCOLITICA: NOT DETECTED

## 2015-08-02 LAB — C DIFFICILE QUICK SCREEN W PCR REFLEX
C DIFFICILE (CDIFF) TOXIN: NEGATIVE
C DIFFICLE (CDIFF) ANTIGEN: NEGATIVE
C Diff interpretation: NEGATIVE

## 2015-08-02 LAB — HEMOGLOBIN
HEMOGLOBIN: 12.6 g/dL (ref 12.0–16.0)
HEMOGLOBIN: 12.7 g/dL (ref 12.0–16.0)

## 2015-08-02 LAB — ABO/RH: ABO/RH(D): O POS

## 2015-08-02 LAB — TYPE AND SCREEN
ABO/RH(D): O POS
Antibody Screen: NEGATIVE

## 2015-08-02 MED ORDER — BENAZEPRIL HCL 20 MG PO TABS
40.0000 mg | ORAL_TABLET | Freq: Every day | ORAL | Status: DC
  Filled 2015-08-02: qty 2

## 2015-08-02 MED ORDER — IRBESARTAN 75 MG PO TABS: 300.0000 mg | ORAL_TABLET | Freq: Every day | ORAL | Status: DC

## 2015-08-02 MED ORDER — SODIUM CHLORIDE 0.9 % IJ SOLN
3.0000 mL | Freq: Two times a day (BID) | INTRAMUSCULAR | Status: DC
  Administered 2015-08-04 – 2015-08-06 (×3): 3 mL via INTRAVENOUS

## 2015-08-02 MED ORDER — POTASSIUM CHLORIDE CRYS ER 20 MEQ PO TBCR
20.0000 meq | EXTENDED_RELEASE_TABLET | Freq: Every day | ORAL | Status: DC
  Filled 2015-08-02: qty 1

## 2015-08-02 MED ORDER — SODIUM CHLORIDE 0.9 % IV BOLUS (SEPSIS)
1000.0000 mL | Freq: Once | INTRAVENOUS | Status: AC
  Administered 2015-08-02: 1000 mL via INTRAVENOUS

## 2015-08-02 MED ORDER — AMLODIPINE BESYLATE 5 MG PO TABS
5.0000 mg | ORAL_TABLET | Freq: Every day | ORAL | Status: DC
  Administered 2015-08-02 – 2015-08-07 (×5): 5 mg via ORAL
  Filled 2015-08-02 (×5): qty 1

## 2015-08-02 MED ORDER — LEVOTHYROXINE SODIUM 100 MCG PO TABS
100.0000 ug | ORAL_TABLET | Freq: Every day | ORAL | Status: DC
  Administered 2015-08-03 – 2015-08-07 (×4): 100 ug via ORAL
  Filled 2015-08-02: qty 2
  Filled 2015-08-02: qty 1
  Filled 2015-08-02 (×3): qty 2

## 2015-08-02 MED ORDER — ONDANSETRON HCL 4 MG/2ML IJ SOLN
4.0000 mg | Freq: Four times a day (QID) | INTRAMUSCULAR | Status: DC | PRN
  Administered 2015-08-02 (×2): 4 mg via INTRAVENOUS
  Filled 2015-08-02 (×2): qty 2

## 2015-08-02 MED ORDER — PREDNISONE 5 MG PO TABS
5.0000 mg | ORAL_TABLET | ORAL | Status: DC
  Administered 2015-08-03 – 2015-08-07 (×3): 5 mg via ORAL
  Filled 2015-08-02 (×3): qty 1

## 2015-08-02 MED ORDER — PANTOPRAZOLE SODIUM 40 MG PO TBEC
40.0000 mg | DELAYED_RELEASE_TABLET | Freq: Every day | ORAL | Status: DC
  Administered 2015-08-02 – 2015-08-07 (×5): 40 mg via ORAL
  Filled 2015-08-02 (×5): qty 1

## 2015-08-02 MED ORDER — ALPRAZOLAM 0.25 MG PO TABS
0.2500 mg | ORAL_TABLET | Freq: Three times a day (TID) | ORAL | Status: DC
  Administered 2015-08-02 – 2015-08-07 (×15): 0.25 mg via ORAL
  Filled 2015-08-02 (×16): qty 1

## 2015-08-02 MED ORDER — METRONIDAZOLE IN NACL 5-0.79 MG/ML-% IV SOLN
500.0000 mg | Freq: Three times a day (TID) | INTRAVENOUS | Status: DC
  Administered 2015-08-02 – 2015-08-07 (×15): 500 mg via INTRAVENOUS
  Filled 2015-08-02 (×20): qty 100

## 2015-08-02 MED ORDER — POTASSIUM CHLORIDE IN NACL 20-0.9 MEQ/L-% IV SOLN
INTRAVENOUS | Status: DC
  Administered 2015-08-02 – 2015-08-06 (×7): via INTRAVENOUS
  Filled 2015-08-02 (×9): qty 1000

## 2015-08-02 MED ORDER — MORPHINE SULFATE (PF) 2 MG/ML IV SOLN: 2.0000 mg | INTRAVENOUS | Status: DC | PRN

## 2015-08-02 MED ORDER — VERAPAMIL HCL ER 240 MG PO TBCR
240.0000 mg | EXTENDED_RELEASE_TABLET | Freq: Every day | ORAL | Status: DC
  Administered 2015-08-02 – 2015-08-06 (×5): 240 mg via ORAL
  Filled 2015-08-02 (×8): qty 1

## 2015-08-02 MED ORDER — FENOFIBRATE 54 MG PO TABS
54.0000 mg | ORAL_TABLET | Freq: Every day | ORAL | Status: DC
  Filled 2015-08-02: qty 1

## 2015-08-02 MED ORDER — SODIUM CHLORIDE 0.9 % IV SOLN
1.0000 g | INTRAVENOUS | Status: DC
  Administered 2015-08-02 – 2015-08-07 (×6): 1 g via INTRAVENOUS
  Filled 2015-08-02 (×8): qty 1

## 2015-08-02 NOTE — Consult Note (Signed)
Patient here for acute onset of nausea, vomiting, crampy abd pain without hematemesis, followed by diarrhea and rectal bleeding and was admitted to the hospital.  At this time her pain is better, no peritoneal signs.  Possible food poisoning with toxin production (ate chicken salad)  Her PCR GI panel was neg.  Possible she developed an acute case of ischemic colitis as she is most tender in LLQ.  I agree with use of antibiotics, she is allergic  To floxin type class.  Will likely do flex sig tomorrow or Sunday.  Hydration and observation with pain control for now.  Will follow with you.

## 2015-08-02 NOTE — ED Provider Notes (Signed)
[facility] Emergency Department Provider Note  ____________________________________________  Time seen: 7:40 AM  I have reviewed the triage vital signs and the nursing notes.   HISTORY  Chief Complaint Blood In Stools and Abdominal Pain    HPI Kristi Barrera is a 75 y.o. female who complains of nausea vomiting and diarrhea. She had nausea vomiting and lower abdominal pain that started about 10 PM last night. However around 2:00 AM she started having bloody bowel movements. She estimates that she's had 5 or 6 grossly bloody bowel movements since then. She complains of dizziness as well. No chest pain or shortness of breath. No fever or syncope. She has a history of diverticular bleed, does not take any anticoagulants.     Past Medical History  Diagnosis Date  . GERD (gastroesophageal reflux disease)   . Palpitations   . Hypothyroidism   . HTN (hypertension)   . Hyperlipidemia   . Chest pain   . Sinusitis   . Recurrent UTI   . Chronic low back pain   . Sarcoidosis ([place])     [place] 15-70yrs ago  . Skin cancer   . Low blood potassium   . Vitamin D deficiency      Patient Active Problem List   Diagnosis Date Noted  . Nocturnal hypoxemia 02/14/2013  . OSA (obstructive sleep apnea) 02/29/2012  . PAH (pulmonary artery hypertension) ([place]) 11/17/2011  . Sarcoidosis ([place]) 11/17/2011     Past Surgical History  Procedure Laterality Date  . Breast cyst aspiration Left     neg     Current Outpatient Rx  Name  Route  Sig  Dispense  Refill  . ALPRAZolam (XANAX) 0.25 MG tablet   Oral   Take 0.25 mg by mouth 3 (three) times daily.          Marland Kitchen amLODipine (NORVASC) 5 MG tablet   Oral   Take 5 mg by mouth daily.         . benazepril (LOTENSIN) 40 MG tablet   Oral   Take 40 mg by mouth daily.         Marland Kitchen esomeprazole (NEXIUM) 40 MG capsule   Oral   Take 40 mg by mouth daily at 12 noon.         . fenofibrate micronized  (LOFIBRA) 134 MG capsule   Oral   Take 134 mg by mouth Daily.          Marland Kitchen levothyroxine (SYNTHROID) 100 MCG tablet   Oral   Take 100 mcg by mouth daily.         Marland Kitchen olmesartan (BENICAR) 40 MG tablet   Oral   Take 40 mg by mouth daily.         . potassium chloride SA (K-DUR,KLOR-CON) 20 MEQ tablet   Oral   Take 20 mEq by mouth daily.         . predniSONE (DELTASONE) 10 MG tablet   Oral   Take 5 mg by mouth every other day. At lunchtime         . verapamil (VERELAN PM) 240 MG 24 hr capsule   Oral   Take 240 mg by mouth at bedtime.         . ranitidine (ZANTAC) 150 MG tablet   Oral   Take 1 tablet (150 mg total) by mouth 2 (two) times daily. Patient not taking: Reported on 08/02/2015   60 tablet   1      Allergies  Clonidine derivatives; Floxin; Hydralazine hcl; Iohexol; Lotrel; Sulfa antibiotics; and Toprol xl   Family History  Problem Relation Age of Onset  . Heart disease Father     MI  . Stroke Father   . Stroke Mother   . Rheum arthritis Mother     Social History Social History  Substance Use Topics  . Smoking status: Never Smoker   . Smokeless tobacco: Never Used  . Alcohol Use: No    Review of Systems  Constitutional:   No fever or chills. No weight changes Eyes:   No blurry vision or double vision.  ENT:   No sore throat. Cardiovascular:   No chest pain. Respiratory:   No dyspnea or cough. Gastrointestinal:   Lower abdominal pain with vomiting and diarrhea.  No BRBPR or melena. Genitourinary:   Negative for dysuria, urinary retention, bloody urine, or difficulty urinating. Musculoskeletal:   Negative for back pain. No joint swelling or pain. Skin:   Negative for rash. Neurological:   Negative for headaches, focal weakness or numbness. Psychiatric:  No anxiety or depression.   Endocrine:  No hot/cold intolerance, changes in energy, or sleep difficulty.  10-point ROS otherwise  negative.  ____________________________________________   PHYSICAL EXAM:  VITAL SIGNS: ED Triage Vitals  Enc Vitals Group     BP 08/02/15 0705 184/80 mmHg     Pulse Rate 08/02/15 0705 97     Resp 08/02/15 0705 18     Temp 08/02/15 0705 98.1 F (36.7 C)     Temp Source 08/02/15 0705 Oral     SpO2 08/02/15 0705 97 %     Weight 08/02/15 0705 152 lb (68.947 kg)     Height 08/02/15 0705 5\' 2"  (1.575 m)     Head Cir --      Peak Flow --      Pain Score 08/02/15 0705 0     Pain Loc --      Pain Edu? --      Excl. in [place]? --     Vital signs reviewed, nursing assessments reviewed.   Constitutional:   Alert and oriented. Well appearing and in no distress. Eyes:   No scleral icterus. No conjunctival pallor. PERRL. EOMI ENT   Head:   Normocephalic and atraumatic.   Nose:   No congestion/rhinnorhea. No septal hematoma   Mouth/Throat:   MMM, no pharyngeal erythema. No peritonsillar mass. No uvula shift.   Neck:   No stridor. No SubQ emphysema. No meningismus. Hematological/Lymphatic/Immunilogical:   No cervical lymphadenopathy. Cardiovascular:   Tachycardia heart rate 100-110. Normal and symmetric distal pulses are present in all extremities. No murmurs, rubs, or gallops. Respiratory:   Normal respiratory effort without tachypnea nor retractions. Breath sounds are clear and equal bilaterally. No wheezes/rales/rhonchi. Gastrointestinal:   Soft with mild bilateral lower abdominal tenderness. No distention. There is no CVA tenderness.  No rebound, rigidity, or guarding. Rectal exam reveals gross blood without stool, strongly Hemoccult-positive Genitourinary:   deferred Musculoskeletal:   Nontender with normal range of motion in all extremities. No joint effusions.  No lower extremity tenderness.  No edema. Neurologic:   Normal speech and language.  CN 2-10 normal. Motor grossly intact. No gross focal neurologic deficits are appreciated.  Skin:    Skin is warm, dry and  intact. No rash noted.  No petechiae, purpura, or bullae. Psychiatric:   Mood and affect are normal. Speech and behavior are normal. Patient exhibits appropriate insight and judgment.  ____________________________________________    LABS (  pertinent positives/negatives) (all labs ordered are listed, but only abnormal results are displayed) Labs Reviewed  COMPREHENSIVE METABOLIC PANEL - Abnormal; Notable for the following:    Potassium 3.4 (*)    Glucose, Bld 116 (*)    Calcium 8.8 (*)    All other components within normal limits  CBC - Abnormal; Notable for the following:    WBC 12.5 (*)    All other components within normal limits  GASTROINTESTINAL PANEL BY PCR, STOOL (REPLACES STOOL CULTURE)  POC OCCULT BLOOD, ED  TYPE AND SCREEN  ABO/RH   ____________________________________________   EKG    ____________________________________________    RADIOLOGY    ____________________________________________   PROCEDURES   ____________________________________________   INITIAL IMPRESSION / ASSESSMENT AND PLAN / ED COURSE  Pertinent labs & imaging results that were available during my care of the patient were reviewed by me and considered in my medical decision making (see chart for details).  Patient presents with acute lower GI bleed with tachycardia and dizziness indicative of significant volume loss which may be a combination of acute blood loss and dehydration from vomiting and diarrhea. Hemoglobin is okay, blood pressure is stable. Case discussed with the hospitalist for further evaluation and management     ____________________________________________   FINAL CLINICAL IMPRESSION(S) / ED DIAGNOSES  Final diagnoses:  Acute lower GI bleeding      Carrie Mew, MD 08/02/15 [phone]

## 2015-08-02 NOTE — ED Notes (Signed)
Patient arrived to [facility] ED with c/o bilateral Lower Abdominal Pain with N/V/D and Bright red blood in her stools. Patient has a history of diverticulitis 20 years ago. Patient was evaluated by EMS last night and refused transport.

## 2015-08-02 NOTE — Consult Note (Signed)
GI Inpatient Consult Note  Reason for Consult: N/V, diarrhea with rectal bleeding   Attending Requesting Consult:  History of Present Illness: Kristi Barrera is a 75 y.o. female who presented to the [facility] ED today (08/02/15) after several episodes of vomiting and hematochezia.    On 08/01/15 the pt ate a chicken salad, and 2 hours later she began have intermittent lower abdominal pain.  She had 2 episodes of diarrhea, the second of which contained bright red blood.  She also had 4 episodes of vomiting,at least one episode of undigested food. EMS was called but she did not come to the ED at that time.  At 2:00 am the abdominal pain began again and she had 4 more bloody bowel movements and continued to vomit; there was no blood in the vomit. With the episodes of vomiting, she became sweaty and dizzy each time. Bowel movements improve the abdominal pain.  Since coming to the hospital, she has had several more bloody bowel movements.  The last one was around 13:00 on 1/13.  She believes that the blood is getting better.    She denies any anal pain, or sick contacts.  No one else that ate with her became ill.  She drinks city water.  She takes mylanta approximately once a week for GERD; she says that the episodes are food related and do not occur if she avoids her triggers.  Her brother died of colon cancer in his late 71's.  Of note, she is on 2L of O2 at night after completing a sleep study.  Her last colonoscopy was in 2005 after an episode of rectal bleeding.  We do not have a copy of that report.  She reports she had a hard time waking up from the procedure and has not had another one because of that.  She does perform yearly stool cards.  Past Medical History:  Past Medical History  Diagnosis Date  . GERD (gastroesophageal reflux disease)   . Palpitations   . Hypothyroidism   . HTN (hypertension)   . Hyperlipidemia   . Chest pain   . Sinusitis   . Recurrent UTI   . Chronic low back pain    . Sarcoidosis ([place])     [place]---[facility] 15-45yrs ago  . Skin cancer   . Low blood potassium   . Vitamin D deficiency     Problem List: Patient Active Problem List   Diagnosis Date Noted  . GI bleed 08/02/2015  . Nocturnal hypoxemia 02/14/2013  . OSA (obstructive sleep apnea) 02/29/2012  . PAH (pulmonary artery hypertension) ([place]) 11/17/2011  . Sarcoidosis ([place]) 11/17/2011    Past Surgical History: Past Surgical History  Procedure Laterality Date  . Breast cyst aspiration Left     neg    Allergies: Allergies  Allergen Reactions  . Clonidine Derivatives     Swelling, chest tightness  . Floxin [Ofloxacin] Other (See Comments)    Hallucinations   . Hydralazine Hcl     BP dropped, HR increased, weakness, nausea, syncope  . Iohexol   . Lotrel [Amlodipine Besy-Benazepril Hcl] Itching  . Sulfa Antibiotics   . Toprol Xl [Metoprolol Succinate]     fatigue    Home Medications: Prescriptions prior to admission  Medication Sig Dispense Refill Last Dose  . ALPRAZolam (XANAX) 0.25 MG tablet Take 0.25 mg by mouth 3 (three) times daily.    08/01/2015 at pm  . amLODipine (NORVASC) 5 MG tablet Take 5 mg by mouth daily.  08/01/2015 at am  . benazepril (LOTENSIN) 40 MG tablet Take 40 mg by mouth daily.   08/01/2015 at am  . esomeprazole (NEXIUM) 40 MG capsule Take 40 mg by mouth daily at 12 noon.   08/01/2015 at am  . fenofibrate micronized (LOFIBRA) 134 MG capsule Take 134 mg by mouth Daily.    08/01/2015 at am  . levothyroxine (SYNTHROID) 100 MCG tablet Take 100 mcg by mouth daily.   08/01/2015 at am  . olmesartan (BENICAR) 40 MG tablet Take 40 mg by mouth daily.   08/01/2015 at am  . potassium chloride SA (K-DUR,KLOR-CON) 20 MEQ tablet Take 20 mEq by mouth daily.   08/01/2015 at am  . predniSONE (DELTASONE) 10 MG tablet Take 5 mg by mouth every other day. At lunchtime   08/01/2015 at pm  . verapamil (VERELAN PM) 240 MG 24 hr capsule Take 240 mg by mouth at bedtime.   07/31/2015 at pm    Home medication reconciliation was completed with the patient.   Scheduled Inpatient Medications:   . ALPRAZolam  0.25 mg Oral TID  . amLODipine  5 mg Oral Daily  . benazepril  40 mg Oral Daily  . ertapenem  1 g Intravenous Q24H  . fenofibrate  54 mg Oral Daily  . irbesartan  300 mg Oral Daily  . levothyroxine  100 mcg Oral QAC breakfast  . metronidazole  500 mg Intravenous Q8H  . pantoprazole  40 mg Oral Daily  . potassium chloride SA  20 mEq Oral Daily  . [START ON 08/03/2015] predniSONE  5 mg Oral QODAY  . sodium chloride  3 mL Intravenous Q12H  . verapamil  240 mg Oral QHS    Continuous Inpatient Infusions:   . 0.9 % NaCl with KCl 20 mEq / L 75 mL/hr at 08/02/15 1237    PRN Inpatient Medications:  morphine injection, ondansetron (ZOFRAN) IV  Family History: family history includes Heart disease in her father; Rheum arthritis in her mother; Stroke in her father and mother.   Social History:   reports that she has never smoked. She has never used smokeless tobacco. She reports that she does not drink alcohol or use illicit drugs.    Review of Systems: Constitutional: Weight is stable.  Eyes: No changes in vision. ENT: No oral lesions, sore throat.  GI: see HPI.  Heme/Lymph: No easy bruising.  CV: No chest pain.  GU: No hematuria.  Integumentary: No rashes.  Neuro: No headaches.  Psych: No depression/anxiety.  Endocrine: No heat/cold intolerance.  Allergic/Immunologic: No urticaria.  Resp: No cough, SOB.  Musculoskeletal: No joint swelling.    Physical Examination: BP 161/60 mmHg  Pulse 91  Temp(Src) 98.1 F (36.7 C) (Oral)  Resp 18  Ht 5\' 2"  (1.575 m)  Wt 72.576 kg (160 lb)  BMI 29.26 kg/m2  SpO2 95% Gen: NAD, alert and oriented x 4, daughter is present HEENT: PEERLA, EOMI, Neck: supple, no JVD or thyromegaly Chest: CTA bilaterally, no wheezes, crackles, or other adventitious sounds CV: RRR, no m/g/c/r Abd: soft, generalized tenderness, with more  in the LLQ, ND, +BS in all four quadrants; no HSM, guarding, ridigity, or rebound tenderness Ext: no edema, well perfused with 2+ pulses, Skin: no rash or lesions noted Lymph: no LAD  Data: Lab Results  Component Value Date   WBC 12.5* 08/02/2015   HGB 12.6 08/02/2015   HCT 39.7 08/02/2015   MCV 88.1 08/02/2015   PLT 255 08/02/2015    Recent Labs Lab  08/02/15 0716 08/02/15 1052  HGB 13.3 12.6   Lab Results  Component Value Date   NA 137 08/02/2015   K 3.4* 08/02/2015   CL 104 08/02/2015   CO2 24 08/02/2015   BUN 15 08/02/2015   CREATININE 0.76 08/02/2015   Lab Results  Component Value Date   ALT 20 08/02/2015   AST 19 08/02/2015   ALKPHOS 54 08/02/2015   BILITOT 0.5 08/02/2015   No results for input(s): APTT, INR, PTT in the last 168 hours.    Assessment/Plan: [name] is a 75 y.o. female with acute onset of diarrhea with rectal bleeding, n/v.  WBC 12.5, potassium 3.4.  Is being treated with Invanz, Flagyl, protonix and zofran. C diff negative.  This may be an episode of food poisoning with the quick onset and vomiting of undigested food.  Possibly ischemic colitis.    Recommendations: We agree with continuing to control nausea and pain.  We agree with protonix.  We will consider a flex sig/colonoscopy if she does not improve. Please see Dr. Percell Boston note for any further recommendations.  We will continue to follow with you. Thank you for the consult. Please call with questions or concerns.  Salvadore Farber, PA-C  I personally performed these services.

## 2015-08-02 NOTE — Progress Notes (Signed)
Pastoral Care and prayer provided for patient and family. °

## 2015-08-02 NOTE — H&P (Signed)
[facility] [place] [facility]   PATIENT NAME: Kristi Barrera    MR#:  [id number]  DATE OF BIRTH:  [birth year]  DATE OF ADMISSION:  08/02/2015  PRIMARY CARE PHYSICIAN: Marguarite Arbour, MD   REQUESTING/REFERRING PHYSICIAN: Alfonse Flavors  CHIEF COMPLAINT:   Chief Complaint  Patient presents with  . Blood In Stools  . Abdominal Pain    HISTORY OF PRESENT ILLNESS:  Kristi Barrera  is a 75 y.o. female with a known history of hypertension, hypothyroidism, arthritis. She presents to the ER after 10 PM last night started to have abdominal pains, nausea vomiting and diarrhea. She vomited at least 4 times and hematemesis. Pain was described as 8 out of 10 intensity. Starting around 2 AM, she developed bloody diarrhea. She's had 6 or 7 small bloody bowel movements not a lot of blood each time. In one here in the emergency room. Hospitalist services were contacted for further evaluation. Of note she recently did have a Z-Pak.  PAST MEDICAL HISTORY:   Past Medical History  Diagnosis Date  . GERD (gastroesophageal reflux disease)   . Palpitations   . Hypothyroidism   . HTN (hypertension)   . Hyperlipidemia   . Chest pain   . Sinusitis   . Recurrent UTI   . Chronic low back pain   . Sarcoidosis ([facility])     [place]---[facility] 15-62yrs ago  . Skin cancer   . Low blood potassium   . Vitamin D deficiency     PAST SURGICAL HISTORY:   Past Surgical History  Procedure Laterality Date  . Breast cyst aspiration Left     neg  . Urinary procedure      SOCIAL HISTORY:   Social History  Substance Use Topics  . Smoking status: Never Smoker   . Smokeless tobacco: Never Used  . Alcohol Use: No    FAMILY HISTORY:   Family History  Problem Relation Age of Onset  . Heart disease Father     MI  . Stroke Father   . Pneumonia Father   . Hypertension Father   . Stroke Mother   . Rheum arthritis Mother     DRUG ALLERGIES:   Allergies  Allergen  Reactions  . Clonidine Derivatives     Swelling, chest tightness  . Floxin [Ofloxacin] Other (See Comments)    Hallucinations   . Hydralazine Hcl     BP dropped, HR increased, weakness, nausea, syncope  . Iohexol   . Lotrel [Amlodipine Besy-Benazepril Hcl] Itching  . Sulfa Antibiotics   . Toprol Xl [Metoprolol Succinate]     fatigue    REVIEW OF SYSTEMS:  CONSTITUTIONAL: No fever, positive chills and sweats. Positive for weight gain. Positive for fatigue.  EYES: No blurred or double vision. Wears reading glasses EARS, NOSE, AND THROAT: No tinnitus or ear pain. No sore throat. Recent runny nose. RESPIRATORY: No cough, shortness of breath, wheezing or hemoptysis.  CARDIOVASCULAR: No chest pain, orthopnea, edema.  GASTROINTESTINAL: Positive for nausea, vomiting, diarrhea and abdominal pain. Positive for blood in bowel movements. GENITOURINARY: No dysuria, hematuria.  ENDOCRINE: No polyuria, nocturia,  HEMATOLOGY: No anemia, easy bruising or bleeding SKIN: No rash or lesion. MUSCULOSKELETAL: Positive for arthritis- the ankle-foot  NEUROLOGIC: No tingling, numbness, weakness.  PSYCHIATRY: Positive for anxiety.   MEDICATIONS AT HOME:   Prior to Admission medications   Medication Sig Start Date End Date Taking? Authorizing Provider  ALPRAZolam (XANAX) 0.25 MG tablet Take 0.25 mg by mouth 3 (  three) times daily.    Yes Historical Provider, MD  amLODipine (NORVASC) 5 MG tablet Take 5 mg by mouth daily.   Yes Historical Provider, MD  benazepril (LOTENSIN) 40 MG tablet Take 40 mg by mouth daily.   Yes Historical Provider, MD  esomeprazole (NEXIUM) 40 MG capsule Take 40 mg by mouth daily at 12 noon.   Yes Historical Provider, MD  fenofibrate micronized (LOFIBRA) 134 MG capsule Take 134 mg by mouth Daily.  11/03/11  Yes Historical Provider, MD  levothyroxine (SYNTHROID) 100 MCG tablet Take 100 mcg by mouth daily.   Yes Historical Provider, MD  olmesartan (BENICAR) 40 MG tablet Take 40 mg by  mouth daily.   Yes Historical Provider, MD  potassium chloride SA (K-DUR,KLOR-CON) 20 MEQ tablet Take 20 mEq by mouth daily. 02/27/15 02/27/16 Yes Historical Provider, MD  predniSONE (DELTASONE) 10 MG tablet Take 5 mg by mouth every other day. At lunchtime   Yes Historical Provider, MD  verapamil (VERELAN PM) 240 MG 24 hr capsule Take 240 mg by mouth at bedtime.   Yes Historical Provider, MD      VITAL SIGNS:  Blood pressure 158/58, pulse 79, temperature 98.1 F (36.7 C), temperature source Oral, resp. rate 18, height 5\' 2"  (1.575 m), weight 72.576 kg (160 lb), SpO2 95 %.  PHYSICAL EXAMINATION:  GENERAL:  75 y.o.-year-old patient lying in the bed with no acute distress.  EYES: Pupils equal, round, reactive to light and accommodation. No scleral icterus. Extraocular muscles intact.  HEENT: Head atraumatic, normocephalic. Oropharynx and nasopharynx clear.  NECK:  Supple, no jugular venous distention. No thyroid enlargement, no tenderness.  LUNGS: Normal breath sounds bilaterally, no wheezing, rales,rhonchi or crepitation. No use of accessory muscles of respiration.  CARDIOVASCULAR: S1, S2 normal. No murmurs, rubs, or gallops.  ABDOMEN: Soft, left lower quadrant tenderness, nondistended. Bowel sounds present. No organomegaly or mass. Positive gross blood and clots in the commode. EXTREMITIES: Trace edema, no cyanosis, or clubbing.  NEUROLOGIC: Cranial nerves II through XII are intact. Muscle strength 5/5 in all extremities. Sensation intact. Gait not checked.  PSYCHIATRIC: The patient is alert and oriented x 3.  SKIN: No rash, lesion, or ulcer.   LABORATORY PANEL:   CBC  Recent Labs Lab 08/02/15 0716 08/02/15 1052  WBC 12.5*  --   HGB 13.3 12.6  HCT 39.7  --   PLT 255  --    ------------------------------------------------------------------------------------------------------------------  Chemistries   Recent Labs Lab 08/02/15 0716  NA 137  K 3.4*  CL 104  CO2 24  GLUCOSE  116*  BUN 15  CREATININE 0.76  CALCIUM 8.8*  AST 19  ALT 20  ALKPHOS 54  BILITOT 0.5   ------------------------------------------------------------------------------------------------------------------   RADIOLOGY:  No results found.  EKG:   Not done in the ER  IMPRESSION AND PLAN:   1. Lower GI bleed with nausea vomiting diarrhea and abdominal pain. Could be infectious versus ischemic colitis. Could also be a diverticular bleed. Send off stool studies. GI consultation. IV fluids with potassium. Clear liquid diet. Serial hemoglobins. 2. Essential hypertension- patient on numerous medications I will continue most of them and monitor blood pressure with the blood loss. 3. Hypothyroidism unspecified continue levothyroxin 4. Vitamin D deficiency- patient states that she takes vitamin D weekly 5. Anxiety on Xanax 6. Hypertriglyceridemia- as per nursing staff she is not taking the fenofibrate.  All the records are reviewed and case discussed with ED provider. Management plans discussed with the patient, family and they are  in agreement.  CODE STATUS: Full code  TOTAL TIME TAKING CARE OF THIS PATIENT: 50 minutes.    Alford Highland M.D on 08/02/2015 at 3:28 PM  Between 7am to 6pm - Pager - [phone]  After 6pm call admission pager [phone]  [facility]  Office  [phone]  CC: Primary care physician; Marguarite Arbour, MD

## 2015-08-03 ENCOUNTER — Encounter: Payer: Self-pay | Admitting: Radiology

## 2015-08-03 ENCOUNTER — Inpatient Hospital Stay: Payer: Medicare Other

## 2015-08-03 LAB — BASIC METABOLIC PANEL
Anion gap: 7 (ref 5–15)
BUN: 8 mg/dL (ref 6–20)
CALCIUM: 8.3 mg/dL — AB (ref 8.9–10.3)
CO2: 22 mmol/L (ref 22–32)
CREATININE: 0.77 mg/dL (ref 0.44–1.00)
Chloride: 112 mmol/L — ABNORMAL HIGH (ref 101–111)
Glucose, Bld: 125 mg/dL — ABNORMAL HIGH (ref 65–99)
Potassium: 3.6 mmol/L (ref 3.5–5.1)
SODIUM: 141 mmol/L (ref 135–145)

## 2015-08-03 LAB — CBC
HCT: 34.8 % — ABNORMAL LOW (ref 35.0–47.0)
HEMOGLOBIN: 11.4 g/dL — AB (ref 12.0–16.0)
MCH: 29.4 pg (ref 26.0–34.0)
MCHC: 32.8 g/dL (ref 32.0–36.0)
MCV: 89.5 fL (ref 80.0–100.0)
Platelets: 208 10*3/uL (ref 150–440)
RBC: 3.88 MIL/uL (ref 3.80–5.20)
RDW: 12.3 % (ref 11.5–14.5)
WBC: 14.5 10*3/uL — AB (ref 3.6–11.0)

## 2015-08-03 LAB — HEMOGLOBIN: HEMOGLOBIN: 11.7 g/dL — AB (ref 12.0–16.0)

## 2015-08-03 LAB — MAGNESIUM: MAGNESIUM: 1.6 mg/dL — AB (ref 1.7–2.4)

## 2015-08-03 MED ORDER — BARIUM SULFATE 2.1 % PO SUSP
450.0000 mL | ORAL | Status: AC
Start: 2015-08-03 — End: 2015-08-03
  Administered 2015-08-03 (×2): 450 mL via ORAL

## 2015-08-03 NOTE — Consult Note (Signed)
GI Inpatient Follow-up Note  Patient Identification: Kristi Barrera is a 75 y.o. female with n/v. Diarrhea with rectal bleeding  Subjective:  She reports she is feeling a little better today, stomach is very sore, hurts to get up and walk around.  She has had two more episodes of BM's today, reports they are not as large as before and seem to be more formed.  Blood is present, but not as much as previous.  Her last BM was approx 30 minutes after clear liquids.  Has not had any further vomiting, has been slightly nauseas, is controlled with zofran.  She has not taken anything for the abdominal soreness.  Scheduled Inpatient Medications:  . ALPRAZolam  0.25 mg Oral TID  . amLODipine  5 mg Oral Daily  . ertapenem  1 g Intravenous Q24H  . levothyroxine  100 mcg Oral QAC breakfast  . metronidazole  500 mg Intravenous Q8H  . pantoprazole  40 mg Oral Daily  . predniSONE  5 mg Oral QODAY  . sodium chloride  3 mL Intravenous Q12H  . verapamil  240 mg Oral QHS    Continuous Inpatient Infusions:   . 0.9 % NaCl with KCl 20 mEq / L 40 mL/hr at 08/03/15 1258    PRN Inpatient Medications:  morphine injection, ondansetron (ZOFRAN) IV  Review of Systems: Constitutional: Weight is stable.  Eyes: No changes in vision. ENT: No oral lesions, sore throat.  GI: see HPI.  Heme/Lymph: No easy bruising.  CV: No chest pain.  GU: No hematuria.  Integumentary: No rashes.  Neuro: No headaches.  Psych: No depression/anxiety.  Endocrine: No heat/cold intolerance.  Allergic/Immunologic: No urticaria.  Resp: No cough, SOB.  Musculoskeletal: No joint swelling.    Physical Examination: BP 149/58 mmHg  Pulse 83  Temp(Src) 97.9 F (36.6 C) (Oral)  Resp 19  Ht 5\' 2"  (1.575 m)  Wt 72.576 kg (160 lb)  BMI 29.26 kg/m2  SpO2 94% Gen: NAD, alert and oriented x 4 HEENT: PEERLA, EOMI, Neck: supple, no JVD or thyromegaly Chest: CTA bilaterally, no wheezes, crackles, or other adventitious sounds CV:  RRR, no m/g/c/r Abd: soft, generalized tenderness, ND, +BS in all four quadrants; no HSM, guarding, ridigity, or rebound tenderness Ext: no edema, well perfused with 2+ pulses, Skin: no rash or lesions noted Lymph: no LAD  Data: Lab Results  Component Value Date   WBC 14.5* 08/03/2015   HGB 11.4* 08/03/2015   HCT 34.8* 08/03/2015   MCV 89.5 08/03/2015   PLT 208 08/03/2015    Recent Labs Lab 08/02/15 1753 08/03/15 0131 08/03/15 0926  HGB 12.7 11.7* 11.4*   Lab Results  Component Value Date   NA 141 08/03/2015   K 3.6 08/03/2015   CL 112* 08/03/2015   CO2 22 08/03/2015   BUN 8 08/03/2015   CREATININE 0.77 08/03/2015   Lab Results  Component Value Date   ALT 20 08/02/2015   AST 19 08/02/2015   ALKPHOS 54 08/02/2015   BILITOT 0.5 08/02/2015   No results for input(s): APTT, INR, PTT in the last 168 hours. Assessment/Plan: [name] is a 75 y.o. female with n/v/diarrhea with rectal bleeding.  WBC on admission 12.5, currently 14.5.  hgb stable at 11.4.  Neg c.diff, neg GI panel by PCR  Recommendations: We recommend a CT abdomen/pelvis be completed today.  May still consider luminal evaluation.  We will continue to follow with you. Please call with questions or concerns.  Salvadore Farber, PA-C  I personally performed these services.

## 2015-08-03 NOTE — Plan of Care (Signed)
Problem: Education: Goal: Knowledge of [facility] Education information/materials will improve Outcome: Progressing Pt continues to have NS with potassium.  Continues to receive Flagyl IV. C/o of nausea, Zofran given with some relief.   Problem: Safety: Goal: Ability to remain free from injury will improve Outcome: Progressing Pt is a moderate fall risk.  Independent in room, encouraged to call for assistance when needed. Pt has had one bloody stool since this time.   Problem: Pain Managment: Goal: General experience of comfort will improve Outcome: Progressing Pt has no c/o pain at this time, continue to assess.

## 2015-08-03 NOTE — Progress Notes (Signed)
Patient ID: Kristi Barrera, female   DOB: 12/06/1940, 75 y.o.   MRN: [id number] [facility] PROGRESS NOTE  Kristi Barrera YQM:[id number] DOB: [birth year] DOA: 08/02/2015 PCP: Marguarite Arbour, MD  HPI/Subjective: Patient still can't control the blood in bowel movements. She still had quite a few of them yesterday and today but less blood each time. Less abdominal pain but still has the cramping severe pain at times. Some nausea but no vomiting.  Objective: Filed Vitals:   08/03/15 0452 08/03/15 0856  BP: 124/46 146/53  Pulse: 73 83  Temp: 99.6 F (37.6 C) 98.2 F (36.8 C)  Resp: 18 17    Filed Weights   08/02/15 0705 08/02/15 1134  Weight: 68.947 kg (152 lb) 72.576 kg (160 lb)    ROS: Review of Systems  Constitutional: Negative for fever and chills.  Eyes: Negative for blurred vision.  Respiratory: Negative for cough and shortness of breath.   Cardiovascular: Negative for chest pain.  Gastrointestinal: Positive for nausea, abdominal pain, diarrhea and blood in stool. Negative for vomiting and constipation.  Genitourinary: Negative for dysuria.  Musculoskeletal: Negative for joint pain.  Neurological: Negative for dizziness and headaches.   Exam: Physical Exam  Constitutional: She is oriented to person, place, and time.  HENT:  Nose: No mucosal edema.  Mouth/Throat: No oropharyngeal exudate or posterior oropharyngeal edema.  Eyes: Conjunctivae, EOM and lids are normal. Pupils are equal, round, and reactive to light.  Neck: No JVD present. Carotid bruit is not present. No edema present. No thyroid mass and no thyromegaly present.  Cardiovascular: S1 normal and S2 normal.  Exam reveals no gallop.   No murmur heard. Pulses:      Dorsalis pedis pulses are 2+ on the right side, and 2+ on the left side.  Respiratory: No respiratory distress. She has no wheezes. She has rhonchi in the right lower field. She has no rales.  GI: Soft. Bowel sounds are normal. There is  tenderness in the left lower quadrant.  Musculoskeletal:       Right ankle: She exhibits swelling.       Left ankle: She exhibits swelling.  Lymphadenopathy:    She has no cervical adenopathy.  Neurological: She is alert and oriented to person, place, and time. No cranial nerve deficit.  Skin: Skin is warm. No rash noted. Nails show no clubbing.  Psychiatric: She has a normal mood and affect.      Data Reviewed: Basic Metabolic Panel:  Recent Labs Lab 08/02/15 0716 08/03/15 0926  NA 137 141  K 3.4* 3.6  CL 104 112*  CO2 24 22  GLUCOSE 116* 125*  BUN 15 8  CREATININE 0.76 0.77  CALCIUM 8.8* 8.3*  MG  --  1.6*   Liver Function Tests:  Recent Labs Lab 08/02/15 0716  AST 19  ALT 20  ALKPHOS 54  BILITOT 0.5  PROT 7.0  ALBUMIN 4.1  CBC:  Recent Labs Lab 08/02/15 0716 08/02/15 1052 08/02/15 1753 08/03/15 0131 08/03/15 0926  WBC 12.5*  --   --   --  14.5*  HGB 13.3 12.6 12.7 11.7* 11.4*  HCT 39.7  --   --   --  34.8*  MCV 88.1  --   --   --  89.5  PLT 255  --   --   --  208   CBG:  Recent Labs Lab 08/02/15 1656  GLUCAP 121*    Recent Results (from the past 240 hour(s))  Gastrointestinal Panel  by PCR , Stool     Status: None   Collection Time: 08/02/15  9:10 AM  Result Value Ref Range Status   Campylobacter species NOT DETECTED NOT DETECTED Final   Plesimonas shigelloides NOT DETECTED NOT DETECTED Final   Salmonella species NOT DETECTED NOT DETECTED Final   Yersinia enterocolitica NOT DETECTED NOT DETECTED Final   Vibrio species NOT DETECTED NOT DETECTED Final   Vibrio cholerae NOT DETECTED NOT DETECTED Final   Enteroaggregative E coli (EAEC) NOT DETECTED NOT DETECTED Final   Enteropathogenic E coli (EPEC) NOT DETECTED NOT DETECTED Final   Enterotoxigenic E coli (ETEC) NOT DETECTED NOT DETECTED Final   Shiga like toxin producing E coli (STEC) NOT DETECTED NOT DETECTED Final   E. coli O157 NOT DETECTED NOT DETECTED Final   Shigella/Enteroinvasive E  coli (EIEC) NOT DETECTED NOT DETECTED Final   Cryptosporidium NOT DETECTED NOT DETECTED Final   Cyclospora cayetanensis NOT DETECTED NOT DETECTED Final   Entamoeba histolytica NOT DETECTED NOT DETECTED Final   Giardia lamblia NOT DETECTED NOT DETECTED Final   Adenovirus F40/41 NOT DETECTED NOT DETECTED Final   Astrovirus NOT DETECTED NOT DETECTED Final   Norovirus GI/GII NOT DETECTED NOT DETECTED Final   Rotavirus A NOT DETECTED NOT DETECTED Final   Sapovirus (I, II, IV, and V) NOT DETECTED NOT DETECTED Final  Gastrointestinal Panel by PCR , Stool     Status: None   Collection Time: 08/02/15 11:32 AM  Result Value Ref Range Status   Campylobacter species NOT DETECTED NOT DETECTED Final   Plesimonas shigelloides NOT DETECTED NOT DETECTED Final   Salmonella species NOT DETECTED NOT DETECTED Final   Yersinia enterocolitica NOT DETECTED NOT DETECTED Final   Vibrio species NOT DETECTED NOT DETECTED Final   Vibrio cholerae NOT DETECTED NOT DETECTED Final   Enteroaggregative E coli (EAEC) NOT DETECTED NOT DETECTED Final   Enteropathogenic E coli (EPEC) NOT DETECTED NOT DETECTED Final   Enterotoxigenic E coli (ETEC) NOT DETECTED NOT DETECTED Final   Shiga like toxin producing E coli (STEC) NOT DETECTED NOT DETECTED Final   E. coli O157 NOT DETECTED NOT DETECTED Final   Shigella/Enteroinvasive E coli (EIEC) NOT DETECTED NOT DETECTED Final   Cryptosporidium NOT DETECTED NOT DETECTED Final   Cyclospora cayetanensis NOT DETECTED NOT DETECTED Final   Entamoeba histolytica NOT DETECTED NOT DETECTED Final   Giardia lamblia NOT DETECTED NOT DETECTED Final   Adenovirus F40/41 NOT DETECTED NOT DETECTED Final   Astrovirus NOT DETECTED NOT DETECTED Final   Norovirus GI/GII NOT DETECTED NOT DETECTED Final   Rotavirus A NOT DETECTED NOT DETECTED Final   Sapovirus (I, II, IV, and V) NOT DETECTED NOT DETECTED Final  C difficile quick scan w PCR reflex     Status: None   Collection Time: 08/02/15 11:32 AM   Result Value Ref Range Status   C Diff antigen NEGATIVE NEGATIVE Final   C Diff toxin NEGATIVE NEGATIVE Final   C Diff interpretation Negative for C. difficile  Final     Scheduled Meds: . ALPRAZolam  0.25 mg Oral TID  . amLODipine  5 mg Oral Daily  . ertapenem  1 g Intravenous Q24H  . levothyroxine  100 mcg Oral QAC breakfast  . metronidazole  500 mg Intravenous Q8H  . pantoprazole  40 mg Oral Daily  . predniSONE  5 mg Oral QODAY  . sodium chloride  3 mL Intravenous Q12H  . verapamil  240 mg Oral QHS   Continuous Infusions: .  0.9 % NaCl with KCl 20 mEq / L 75 mL/hr at 08/03/15 1100    Assessment/Plan:  1. Bloody diarrhea, left lower quadrant abdominal pain and nausea- infectious colitis versus ischemic colitis. GI to consider a flexible sigmoidoscopy. So far cultures are negative. Patient's feeling a little bit better but still has nausea abdominal pain and bloody diarrhea. Empiric antibiotics for now. 2. Essential hypertension- continue verapamil amlodipine. Other medications on hold. 3. Hypothyroidism unspecified continue levothyroxine. 4. Hypokalemia- replace in IV fluid 5. Hypertriglyceridemia- patient not taking fenofibrate anymore 6. Anxiety on Xanax 7. Vitamin D deficiency  Code Status:     Code Status Orders        Start     Ordered   08/02/15 0928  Full code   Continuous     08/02/15 0928    Code Status History    Date Active Date Inactive Code Status Order ID Comments User Context   This patient has a current code status but no historical code status.     Family Communication: Family at bedside Disposition Plan: Need to see bleeding subside first  Consultants:  Gastroenterology  Antibiotics:  Invanz  Flagyl  Time spent: 20 minutes    Alford Highland  [facility]

## 2015-08-04 LAB — BASIC METABOLIC PANEL
ANION GAP: 5 (ref 5–15)
BUN: 8 mg/dL (ref 6–20)
CHLORIDE: 111 mmol/L (ref 101–111)
CO2: 25 mmol/L (ref 22–32)
Calcium: 8.2 mg/dL — ABNORMAL LOW (ref 8.9–10.3)
Creatinine, Ser: 0.81 mg/dL (ref 0.44–1.00)
GFR calc non Af Amer: >60 mL/min (ref >60)
Glucose, Bld: 87 mg/dL (ref 65–99)
Potassium: 3.7 mmol/L (ref 3.5–5.1)
Sodium: 141 mmol/L (ref 135–145)

## 2015-08-04 LAB — HEMOGLOBIN: Hemoglobin: 11.2 g/dL — ABNORMAL LOW (ref 12.0–16.0)

## 2015-08-04 MED ORDER — PEG 3350-KCL-NA BICARB-NACL 420 G PO SOLR
4000.0000 mL | Freq: Once | ORAL | Status: DC
  Filled 2015-08-04 (×2): qty 4000

## 2015-08-04 NOTE — Consult Note (Signed)
PAtient CT findings of inflammation from descending and sigmoid to rectosigmoid most consistent with ischemic colitis.  Will get vascular surgery consult tomorrow.  Plan for colonoscopy Tuesday afternoon.  WBC 14.5 yesterday, hgb 11.2 today.  BP 158/51, P 82, T 98.1.  Discussed with her and family the nature of the problem and possible causes for it.  She has seen Dr. Delana Meyer before when a relative was on dialysis and would like to see him.

## 2015-08-04 NOTE — Plan of Care (Signed)
Problem: Safety: Goal: Ability to remain free from injury will improve Outcome: Progressing Pt is alert and oriented, independent in the room. Calls for assistance when needed.  Problem: Pain Managment: Goal: General experience of comfort will improve Outcome: Progressing No complaints of pain at this time. Continue to assess.  Problem: Fluid Volume: Goal: Ability to maintain a balanced intake and output will improve Outcome: Progressing Pt continues to receive NS with potassium at 40 ml/hr.   Problem: Bowel/Gastric: Goal: Will not experience complications related to bowel motility Outcome: Progressing Pt c/o diarrhea from contrast during the day. Mild cramping at times. No bloody stools on this shift at this time.

## 2015-08-04 NOTE — Progress Notes (Signed)
Patient ID: Kristi Barrera, female   DOB: February 04, 1941, 75 y.o.   MRN: [id number] [facility] PROGRESS NOTE  PCP: Marguarite Arbour, MD  HPI/Subjective: Patient feeling a little bit better. Abdominal pain has settled down a little bit. The bleeding is less.  Objective: Filed Vitals:   08/03/15 2027 08/04/15 0531  BP: 152/61 158/51  Pulse: 82 82  Temp: 98.8 F (37.1 C) 98.1 F (36.7 C)  Resp: 15 18    Filed Weights   08/02/15 0705 08/02/15 1134  Weight: 68.947 kg (152 lb) 72.576 kg (160 lb)    ROS: Review of Systems  Constitutional: Negative for fever and chills.  Eyes: Negative for blurred vision.  Respiratory: Negative for cough and shortness of breath.   Cardiovascular: Negative for chest pain.  Gastrointestinal: Positive for abdominal pain, diarrhea and blood in stool. Negative for nausea, vomiting and constipation.  Genitourinary: Negative for dysuria.  Musculoskeletal: Negative for joint pain.  Neurological: Negative for dizziness and headaches.   Exam: Physical Exam  Constitutional: She is oriented to person, place, and time.  HENT:  Nose: No mucosal edema.  Mouth/Throat: No oropharyngeal exudate or posterior oropharyngeal edema.  Eyes: Conjunctivae, EOM and lids are normal. Pupils are equal, round, and reactive to light.  Neck: No JVD present. Carotid bruit is not present. No edema present. No thyroid mass and no thyromegaly present.  Cardiovascular: S1 normal and S2 normal.  Exam reveals no gallop.   No murmur heard. Pulses:      Dorsalis pedis pulses are 2+ on the right side, and 2+ on the left side.  Respiratory: No respiratory distress. She has no wheezes. She has no rhonchi. She has no rales.  GI: Soft. Bowel sounds are normal. There is tenderness in the left lower quadrant.  Musculoskeletal:       Right ankle: She exhibits swelling.       Left ankle: She exhibits swelling.  Lymphadenopathy:    She has no cervical adenopathy.  Neurological:  She is alert and oriented to person, place, and time. No cranial nerve deficit.  Skin: Skin is warm. No rash noted. Nails show no clubbing.  Psychiatric: She has a normal mood and affect.      Data Reviewed: Basic Metabolic Panel:  Recent Labs Lab 08/02/15 0716 08/03/15 0926 08/04/15 0457  NA 137 141 141  K 3.4* 3.6 3.7  CL 104 112* 111  CO2 24 22 25   GLUCOSE 116* 125* 87  BUN 15 8 8   CREATININE 0.76 0.77 0.81  CALCIUM 8.8* 8.3* 8.2*  MG  --  1.6*  --    Liver Function Tests:  Recent Labs Lab 08/02/15 0716  AST 19  ALT 20  ALKPHOS 54  BILITOT 0.5  PROT 7.0  ALBUMIN 4.1   CBC:  Recent Labs Lab 08/02/15 0716 08/02/15 1052 08/02/15 1753 08/03/15 0131 08/03/15 0926 08/04/15 0457  WBC 12.5*  --   --   --  14.5*  --   HGB 13.3 12.6 12.7 11.7* 11.4* 11.2*  HCT 39.7  --   --   --  34.8*  --   MCV 88.1  --   --   --  89.5  --   PLT 255  --   --   --  208  --    CBG:  Recent Labs Lab 08/02/15 1656  GLUCAP 121*    Recent Results (from the past 240 hour(s))  Gastrointestinal Panel by PCR , Stool  Status: None   Collection Time: 08/02/15  9:10 AM  Result Value Ref Range Status   Campylobacter species NOT DETECTED NOT DETECTED Final   Plesimonas shigelloides NOT DETECTED NOT DETECTED Final   Salmonella species NOT DETECTED NOT DETECTED Final   Yersinia enterocolitica NOT DETECTED NOT DETECTED Final   Vibrio species NOT DETECTED NOT DETECTED Final   Vibrio cholerae NOT DETECTED NOT DETECTED Final   Enteroaggregative E coli (EAEC) NOT DETECTED NOT DETECTED Final   Enteropathogenic E coli (EPEC) NOT DETECTED NOT DETECTED Final   Enterotoxigenic E coli (ETEC) NOT DETECTED NOT DETECTED Final   Shiga like toxin producing E coli (STEC) NOT DETECTED NOT DETECTED Final   E. coli O157 NOT DETECTED NOT DETECTED Final   Shigella/Enteroinvasive E coli (EIEC) NOT DETECTED NOT DETECTED Final   Cryptosporidium NOT DETECTED NOT DETECTED Final   Cyclospora  cayetanensis NOT DETECTED NOT DETECTED Final   Entamoeba histolytica NOT DETECTED NOT DETECTED Final   Giardia lamblia NOT DETECTED NOT DETECTED Final   Adenovirus F40/41 NOT DETECTED NOT DETECTED Final   Astrovirus NOT DETECTED NOT DETECTED Final   Norovirus GI/GII NOT DETECTED NOT DETECTED Final   Rotavirus A NOT DETECTED NOT DETECTED Final   Sapovirus (I, II, IV, and V) NOT DETECTED NOT DETECTED Final  Gastrointestinal Panel by PCR , Stool     Status: None   Collection Time: 08/02/15 11:32 AM  Result Value Ref Range Status   Campylobacter species NOT DETECTED NOT DETECTED Final   Plesimonas shigelloides NOT DETECTED NOT DETECTED Final   Salmonella species NOT DETECTED NOT DETECTED Final   Yersinia enterocolitica NOT DETECTED NOT DETECTED Final   Vibrio species NOT DETECTED NOT DETECTED Final   Vibrio cholerae NOT DETECTED NOT DETECTED Final   Enteroaggregative E coli (EAEC) NOT DETECTED NOT DETECTED Final   Enteropathogenic E coli (EPEC) NOT DETECTED NOT DETECTED Final   Enterotoxigenic E coli (ETEC) NOT DETECTED NOT DETECTED Final   Shiga like toxin producing E coli (STEC) NOT DETECTED NOT DETECTED Final   E. coli O157 NOT DETECTED NOT DETECTED Final   Shigella/Enteroinvasive E coli (EIEC) NOT DETECTED NOT DETECTED Final   Cryptosporidium NOT DETECTED NOT DETECTED Final   Cyclospora cayetanensis NOT DETECTED NOT DETECTED Final   Entamoeba histolytica NOT DETECTED NOT DETECTED Final   Giardia lamblia NOT DETECTED NOT DETECTED Final   Adenovirus F40/41 NOT DETECTED NOT DETECTED Final   Astrovirus NOT DETECTED NOT DETECTED Final   Norovirus GI/GII NOT DETECTED NOT DETECTED Final   Rotavirus A NOT DETECTED NOT DETECTED Final   Sapovirus (I, II, IV, and V) NOT DETECTED NOT DETECTED Final  C difficile quick scan w PCR reflex     Status: None   Collection Time: 08/02/15 11:32 AM  Result Value Ref Range Status   C Diff antigen NEGATIVE NEGATIVE Final   C Diff toxin NEGATIVE NEGATIVE  Final   C Diff interpretation Negative for C. difficile  Final     Studies: Ct Abdomen Pelvis Wo Contrast  08/03/2015  CLINICAL DATA:  Severe abdominal pain beginning 2 days ago. Diarrhea. Bloody stool. EXAM: CT ABDOMEN AND PELVIS WITHOUT CONTRAST TECHNIQUE: Multidetector CT imaging of the abdomen and pelvis was performed following the standard protocol without IV contrast. COMPARISON:  None. FINDINGS: Lower chest: No acute findings. Mild right basilar scarring and bronchiectasis noted. Tiny hiatal hernia noted as well as tiny left pleural effusion. Hepatobiliary: Probable mild hepatic steatosis. No mass visualized on this un-enhanced exam. Gallbladder is  unremarkable. Pancreas: No mass or inflammatory process identified on this un-enhanced exam. Spleen: Within normal limits in size. Adrenals/Urinary Tract: No evidence of urolithiasis or hydronephrosis. No definite mass visualized on this un-enhanced exam. Stomach/Bowel: No evidence of bowel obstruction. Long segment bowel wall thickening is seen involving the splenic flexure, descending, and rectosigmoid colon, consistent with colitis. Diverticulosis is seen mainly involving the sigmoid colon, however there is no evidence of short segment or localized diverticulitis. Small amount of free fluid seen in the pelvis, however no abscess identified. Vascular/Lymphatic: No pathologically enlarged lymph nodes. No evidence of abdominal aortic aneurysm. Reproductive: No mass or other significant abnormality. Other: None. Musculoskeletal:  No suspicious bone lesions identified. IMPRESSION: Moderate long segment colitis involving descending and rectosigmoid colon. Small amount of free fluid in pelvis.  No abscess identified. Mild hepatic steatosis and tiny hiatal hernia. Electronically Signed   By: Myles Rosenthal M.D.   On: 08/03/2015 18:19    Scheduled Meds: . ALPRAZolam  0.25 mg Oral TID  . amLODipine  5 mg Oral Daily  . ertapenem  1 g Intravenous Q24H  .  levothyroxine  100 mcg Oral QAC breakfast  . metronidazole  500 mg Intravenous Q8H  . pantoprazole  40 mg Oral Daily  . [START ON 08/05/2015] polyethylene glycol-electrolytes  4,000 mL Oral Once  . predniSONE  5 mg Oral QODAY  . sodium chloride  3 mL Intravenous Q12H  . verapamil  240 mg Oral QHS   Continuous Infusions: . 0.9 % NaCl with KCl 20 mEq / L 40 mL/hr at 08/04/15 8119    Assessment/Plan:  1. Ischemic the lightest with bloody diarrhea and left lower quadrant abdominal pain. GI put in for vascular surgery consult. He will also set up for colonoscopy on Tuesday. Not sure if she needs any antibiotics at this point. 2. Essential hypertension- stable on meds 3. Hypothyroidism unspecified on levothyroxine 4. Anxiety on Xanax 5. Hypertriglyceridemia 6. Vitamin D deficiency  Code Status:     Code Status Orders        Start     Ordered   08/02/15 0928  Full code   Continuous     08/02/15 0928    Code Status History    Date Active Date Inactive Code Status Order ID Comments User Context   This patient has a current code status but no historical code status.     Disposition Plan: Home once bleeding subsides  Antibiotics:  Ertapenem  Flagyl  Time spent: 20 minutes  Alford Highland  [facility]

## 2015-08-05 MED ORDER — DIPHENHYDRAMINE HCL 25 MG PO CAPS: 50.0000 mg | ORAL_CAPSULE | Freq: Once | ORAL | Status: DC

## 2015-08-05 MED ORDER — PREDNISONE 50 MG PO TABS: 50.0000 mg | ORAL_TABLET | Freq: Four times a day (QID) | ORAL | Status: DC

## 2015-08-05 MED ORDER — FAMOTIDINE IN NACL 20-0.9 MG/50ML-% IV SOLN
20.0000 mg | Freq: Two times a day (BID) | INTRAVENOUS | Status: DC
  Administered 2015-08-05 – 2015-08-06 (×4): 20 mg via INTRAVENOUS
  Filled 2015-08-05 (×7): qty 50

## 2015-08-05 MED ORDER — PEG 3350-KCL-NA BICARB-NACL 420 G PO SOLR
4000.0000 mL | Freq: Once | ORAL | Status: AC
  Administered 2015-08-06: 05:00:00 4000 mL via ORAL
  Filled 2015-08-05: qty 4000

## 2015-08-05 NOTE — Care Management Important Message (Signed)
Important Message  Patient Details  Name: Kristi Barrera MRN: [id number] Date of Birth: 14-Nov-1940   Medicare Important Message Given:  Yes    [name] 08/05/2015, 10:20 AM

## 2015-08-05 NOTE — Progress Notes (Signed)
CT and pre meds cancelled per Dr. Vira Agar due to colostomy tomorrow.  [name], RN

## 2015-08-05 NOTE — Consult Note (Signed)
[facility] Vascular Consult Note  MRN : [id number]  Kristi Barrera is a 75 y.o. (1941-02-07) female who presents with chief complaint of  Chief Complaint  Patient presents with  . Blood In Stools  . Abdominal Pain  .  History of Present Illness: I am asked by Dr. Renae Gloss of the Hospitalist service to see the patient regarding ischemic colitis.   she reported to the hospital 3 days ago with bloody diarrhea and abdominal pain. She reports that the bloody stools have decreased and there is scant blood remaining. Her abdominal pain is improved from admission but still significant. Her pain is located in the left abdomen and left pelvis area. She reports a previous history of reflux, but no episodes such as this. Prior to her admission, she had not been having significant medical illnesses or problems to her knowledge. She denied any fevers or chills or signs of systemic infection. She reports no previous abdominal surgery. She underwent a CT scan without contrast but with oral contrast that I have independently reviewed. Very little information about the visceral vessels can be obtained from the study, but she does have some significant calcification of the aorta and branch vessels. She is scheduled for colonoscopy tomorrow. We're consult for further evaluation of her vascular status.   Current Facility-Administered Medications  Medication Dose Route Frequency Provider Last Rate Last Dose  . 0.9 % NaCl with KCl 20 mEq/ L  infusion   Intravenous Continuous Alford Highland, MD 40 mL/hr at 08/05/15 0522    . ALPRAZolam Prudy Feeler) tablet 0.25 mg  0.25 mg Oral TID Alford Highland, MD   0.25 mg at 08/05/15 1039  . amLODipine (NORVASC) tablet 5 mg  5 mg Oral Daily Alford Highland, MD   5 mg at 08/05/15 1039  . diphenhydrAMINE (BENADRYL) capsule 50 mg  50 mg Oral Once Annice Needy, MD      . ertapenem [facility]) 1 g in sodium chloride 0.9 % 50 mL IVPB  1 g Intravenous Q24H Alford Highland, MD 100 mL/hr at 08/04/15 1058 1 g at 08/04/15 1058  . famotidine (PEPCID) IVPB 20 mg premix  20 mg Intravenous Q12H Annice Needy, MD      . levothyroxine (SYNTHROID, LEVOTHROID) tablet 100 mcg  100 mcg Oral QAC breakfast Alford Highland, MD   100 mcg at 08/05/15 0730  . metroNIDAZOLE (FLAGYL) IVPB 500 mg  500 mg Intravenous Q8H Alford Highland, MD 100 mL/hr at 08/05/15 1039 500 mg at 08/05/15 1039  . morphine 2 MG/ML injection 2 mg  2 mg Intravenous Q3H PRN Alford Highland, MD      . ondansetron [facility]) injection 4 mg  4 mg Intravenous Q6H PRN Alford Highland, MD   4 mg at 08/02/15 2046  . pantoprazole (PROTONIX) EC tablet 40 mg  40 mg Oral Daily Alford Highland, MD   40 mg at 08/05/15 1039  . polyethylene glycol-electrolytes (NuLYTELY/GoLYTELY) solution 4,000 mL  4,000 mL Oral Once Scot Jun, MD      . predniSONE (DELTASONE) tablet 5 mg  5 mg Oral QODAY Alford Highland, MD   5 mg at 08/05/15 1039  . predniSONE (DELTASONE) tablet 50 mg  50 mg Oral Q6H Annice Needy, MD      . sodium chloride 0.9 % injection 3 mL  3 mL Intravenous Q12H Alford Highland, MD   3 mL at 08/04/15 1041  . verapamil (CALAN-SR) CR tablet 240 mg  240 mg Oral QHS Richard  Wieting, MD   240 mg at 08/04/15 2245    Past Medical History  Diagnosis Date  . GERD (gastroesophageal reflux disease)   . Palpitations   . Hypothyroidism   . HTN (hypertension)   . Hyperlipidemia   . Chest pain   . Sinusitis   . Recurrent UTI   . Chronic low back pain   . Sarcoidosis (HCC)     [place]---[facility] 15-39yrs ago  . Skin cancer   . Low blood potassium   . Vitamin D deficiency     Past Surgical History  Procedure Laterality Date  . Breast cyst aspiration Left     neg  . Urinary procedure      Social History Social History  Substance Use Topics  . Smoking status: Never Smoker   . Smokeless tobacco: Never Used  . Alcohol Use: No  No IVDU  Family History Family History  Problem Relation Age of Onset   . Heart disease Father     MI  . Stroke Father   . Pneumonia Father   . Hypertension Father   . Stroke Mother   . Rheum arthritis Mother   no bleeding or clotting disorders  Allergies  Allergen Reactions  . Iohexol Shortness Of Breath  . Clonidine Derivatives     Swelling, chest tightness  . Floxin [Ofloxacin] Other (See Comments)    Hallucinations   . Hydralazine Hcl     BP dropped, HR increased, weakness, nausea, syncope  . Lotrel [Amlodipine Besy-Benazepril Hcl] Itching  . Potassium-Containing Compounds Other (See Comments)    Patient states she is allergic to oral Potassium Chloride only, had chest pains and a slight rash   . Sulfa Antibiotics   . Toprol Xl [Metoprolol Succinate]     fatigue     REVIEW OF SYSTEMS (Negative unless checked)  Constitutional: [] Weight loss  [] Fever  [] Chills Cardiac: [] Chest pain   [] Chest pressure   [] Palpitations   [] Shortness of breath when laying flat   [] Shortness of breath at rest   [] Shortness of breath with exertion. Vascular:  [] Pain in legs with walking   [] Pain in legs at rest   [] Pain in legs when laying flat   [] Claudication   [] Pain in feet when walking  [] Pain in feet at rest  [] Pain in feet when laying flat   [] History of DVT   [] Phlebitis   [] Swelling in legs   [] Varicose veins   [] Non-healing ulcers Pulmonary:   [] Uses home oxygen   [] Productive cough   [] Hemoptysis   [] Wheeze  [] COPD   [] Asthma Neurologic:  [] Dizziness  [] Blackouts   [] Seizures   [] History of stroke   [] History of TIA  [] Aphasia   [] Temporary blindness   [] Dysphagia   [] Weakness or numbness in arms   [] Weakness or numbness in legs Musculoskeletal:  [] Arthritis   [] Joint swelling   [] Joint pain   [] Low back pain Hematologic:  [] Easy bruising  [] Easy bleeding   [] Hypercoagulable state   [] Anemic  [] Hepatitis Gastrointestinal:  [x] Blood in stool   [] Vomiting blood  [x] Gastroesophageal reflux/heartburn   [] Difficulty swallowing. Genitourinary:  [] Chronic kidney  disease   [] Difficult urination  [] Frequent urination  [] Burning with urination   [] Blood in urine Skin:  [] Rashes   [] Ulcers   [] Wounds Psychological:  [] History of anxiety   []  History of major depression.  Physical Examination  Filed Vitals:   08/04/15 1947 08/04/15 1949 08/04/15 1950 08/05/15 0541  BP: 174/51 161/72 157/63 142/44  Pulse: 85 88  88 65  Temp: 98.5 F (36.9 C)   97.7 F (36.5 C)  TempSrc: Oral   Oral  Resp: 18 18 18 20   Height:      Weight:      SpO2: 93% 95% 95% 92%   Body mass index is 29.26 kg/(m^2). Gen:  WD/WN, NAD Head: Gleason/AT, No temporalis wasting. Prominent temp pulse not noted. Ear/Nose/Throat: Hearing grossly intact, nares w/o erythema or drainage, oropharynx w/o Erythema/Exudate Eyes: PERRLA, EOMI.  Neck: Supple, no nuchal rigidity.  No JVD.  Pulmonary:  Good air movement, equal bilaterally.  Cardiac: RRR, normal S1, S2. Vascular:  Vessel Right Left  Radial Palpable Palpable                                   Gastrointestinal: soft, tender with guarding in the left upper and lower quadrants. No masses, surgical incisions, or scars. Musculoskeletal: M/S 5/5 throughout.  Extremities without ischemic changes.  No deformity or atrophy. No edema. Neurologic: CN 2-12 intact. Pain and light touch intact in extremities.  Symmetrical.  Speech is fluent. Motor exam as listed above. Psychiatric: Judgment intact, Mood & affect appropriate for pt's clinical situation. Dermatologic: No rashes or ulcers noted.  No cellulitis or open wounds. Lymph : No Cervical, Axillary, or Inguinal lymphadenopathy.      CBC Lab Results  Component Value Date   WBC 14.5* 08/03/2015   HGB 11.2* 08/04/2015   HCT 34.8* 08/03/2015   MCV 89.5 08/03/2015   PLT 208 08/03/2015    BMET    Component Value Date/Time   NA 141 08/04/2015 0457   NA 143 07/10/2013 1350   K 3.7 08/04/2015 0457   K 3.6 07/10/2013 1350   CL 111 08/04/2015 0457   CL 110* 07/10/2013 1350    CO2 25 08/04/2015 0457   CO2 27 07/10/2013 1350   GLUCOSE 87 08/04/2015 0457   GLUCOSE 85 07/10/2013 1350   BUN 8 08/04/2015 0457   BUN 15 07/10/2013 1350   CREATININE 0.81 08/04/2015 0457   CREATININE 0.83 07/10/2013 1350   CALCIUM 8.2* 08/04/2015 0457   CALCIUM 9.1 07/10/2013 1350   GFRNONAA >60 08/04/2015 0457   GFRNONAA >60 07/10/2013 1350   GFRAA >60 08/04/2015 0457   GFRAA >60 07/10/2013 1350   Estimated Creatinine Clearance: 58.6 mL/min (by C-G formula based on Cr of 0.81).  COAG No results found for: INR, PROTIME  Radiology Ct Abdomen Pelvis Wo Contrast  08/03/2015  CLINICAL DATA:  Severe abdominal pain beginning 2 days ago. Diarrhea. Bloody stool. EXAM: CT ABDOMEN AND PELVIS WITHOUT CONTRAST TECHNIQUE: Multidetector CT imaging of the abdomen and pelvis was performed following the standard protocol without IV contrast. COMPARISON:  None. FINDINGS: Lower chest: No acute findings. Mild right basilar scarring and bronchiectasis noted. Tiny hiatal hernia noted as well as tiny left pleural effusion. Hepatobiliary: Probable mild hepatic steatosis. No mass visualized on this un-enhanced exam. Gallbladder is unremarkable. Pancreas: No mass or inflammatory process identified on this un-enhanced exam. Spleen: Within normal limits in size. Adrenals/Urinary Tract: No evidence of urolithiasis or hydronephrosis. No definite mass visualized on this un-enhanced exam. Stomach/Bowel: No evidence of bowel obstruction. Long segment bowel wall thickening is seen involving the splenic flexure, descending, and rectosigmoid colon, consistent with colitis. Diverticulosis is seen mainly involving the sigmoid colon, however there is no evidence of short segment or localized diverticulitis. Small amount of free fluid seen in the pelvis,  however no abscess identified. Vascular/Lymphatic: No pathologically enlarged lymph nodes. No evidence of abdominal aortic aneurysm. Reproductive: No mass or other significant  abnormality. Other: None. Musculoskeletal:  No suspicious bone lesions identified. IMPRESSION: Moderate long segment colitis involving descending and rectosigmoid colon. Small amount of free fluid in pelvis.  No abscess identified. Mild hepatic steatosis and tiny hiatal hernia. Electronically Signed   By: Myles Rosenthal M.D.   On: 08/03/2015 18:19      Assessment/Plan 1. Colitis, likely ischemic in nature. I have discussed with her the pathophysiology and natural history of ischemic colitis. I discussed with her that the majority have small vessel disease which is not amenable to revascularization. I have discussed that a not insignificant minority can have SMA or IMA disease that would benefit from revascularization. Based off her uninfused scan which I have independently reviewed, very little can be determined from her SMA or IMA but there is atherosclerotic calcification of the aorta and branch vessels. I have discussed that mesenteric duplex is not available at our institution, and given her acute colitis it may not be that reliable in the immediate phase anyway. I would recommend a CT angiogram be performed. Given her dialysis, we will start premedication tonight and try to get that in the morning. I have discussed that based off these findings, angiogram may or may not be of benefit. 2. Diet large. We'll give medication with steroids and Benadryl prior to CT scan with contrast. 3. HTN. Stable. On outpatient meds 4. Hyperlipidemia. Stable. On outpatient meds  Lorna Strother, MD  08/05/2015 1:15 PM

## 2015-08-05 NOTE — Plan of Care (Signed)
Problem: Safety: Goal: Ability to remain free from injury will improve Outcome: Progressing Pt is alert and oriented, no c/o pain. Independent in room. Moderate falls, asks for assistance when needed. Continues to receive NS with potassium at 40 ml/hr.  Problem: Bowel/Gastric: Goal: Will not experience complications related to bowel motility Outcome: Progressing Pt reports no bloody stool at this time. Continues to have loose, brown stools. Had contrast for CT on 1/14

## 2015-08-05 NOTE — Consult Note (Signed)
Patient needs colonoscopy to confirm CT findings.  Will do this tomorrow and can get CT angiogram the following day.  Sorry for any scheduling problems.

## 2015-08-05 NOTE — Progress Notes (Signed)
Patient ID: Kristi Barrera, female   DOB: 1941-01-22, 75 y.o.   MRN: [id number] [facility] PROGRESS NOTE  PCP: Marguarite Arbour, MD  HPI/Subjective: Patient feeling a little bit better. Abdominal pain has settled down a little bit. The bleeding has subsided. Last bowel movement Brown in the consistency of chocolate pudding.  Objective: Filed Vitals:   08/05/15 0541 08/05/15 1356  BP: 142/44 136/57  Pulse: 65 88  Temp: 97.7 F (36.5 C) 98.3 F (36.8 C)  Resp: 20 19    Filed Weights   08/02/15 0705 08/02/15 1134  Weight: 68.947 kg (152 lb) 72.576 kg (160 lb)    ROS: Review of Systems  Constitutional: Negative for fever and chills.  Eyes: Negative for blurred vision.  Respiratory: Negative for cough and shortness of breath.   Cardiovascular: Negative for chest pain.  Gastrointestinal: Positive for abdominal pain. Negative for nausea, vomiting, diarrhea, constipation and blood in stool.  Genitourinary: Negative for dysuria.  Musculoskeletal: Negative for joint pain.  Neurological: Negative for dizziness and headaches.   Exam: Physical Exam  Constitutional: She is oriented to person, place, and time.  HENT:  Nose: No mucosal edema.  Mouth/Throat: No oropharyngeal exudate or posterior oropharyngeal edema.  Eyes: Conjunctivae, EOM and lids are normal. Pupils are equal, round, and reactive to light.  Neck: No JVD present. Carotid bruit is not present. No edema present. No thyroid mass and no thyromegaly present.  Cardiovascular: S1 normal and S2 normal.  Exam reveals no gallop.   No murmur heard. Pulses:      Dorsalis pedis pulses are 2+ on the right side, and 2+ on the left side.  Respiratory: No respiratory distress. She has no wheezes. She has no rhonchi. She has no rales.  GI: Soft. Bowel sounds are normal. There is tenderness in the left lower quadrant.  Musculoskeletal:       Right ankle: She exhibits swelling.       Left ankle: She exhibits swelling.   Lymphadenopathy:    She has no cervical adenopathy.  Neurological: She is alert and oriented to person, place, and time. No cranial nerve deficit.  Skin: Skin is warm. No rash noted. Nails show no clubbing.  Psychiatric: She has a normal mood and affect.    Data Reviewed: Basic Metabolic Panel:  Recent Labs Lab 08/02/15 0716 08/03/15 0926 08/04/15 0457  NA 137 141 141  K 3.4* 3.6 3.7  CL 104 112* 111  CO2 24 22 25   GLUCOSE 116* 125* 87  BUN 15 8 8   CREATININE 0.76 0.77 0.81  CALCIUM 8.8* 8.3* 8.2*  MG  --  1.6*  --    Liver Function Tests:  Recent Labs Lab 08/02/15 0716  AST 19  ALT 20  ALKPHOS 54  BILITOT 0.5  PROT 7.0  ALBUMIN 4.1   CBC:  Recent Labs Lab 08/02/15 0716 08/02/15 1052 08/02/15 1753 08/03/15 0131 08/03/15 0926 08/04/15 0457  WBC 12.5*  --   --   --  14.5*  --   HGB 13.3 12.6 12.7 11.7* 11.4* 11.2*  HCT 39.7  --   --   --  34.8*  --   MCV 88.1  --   --   --  89.5  --   PLT 255  --   --   --  208  --    CBG:  Recent Labs Lab 08/02/15 1656  GLUCAP 121*    Recent Results (from the past 240 hour(s))  Gastrointestinal Panel  by PCR , Stool     Status: None   Collection Time: 08/02/15  9:10 AM  Result Value Ref Range Status   Campylobacter species NOT DETECTED NOT DETECTED Final   Plesimonas shigelloides NOT DETECTED NOT DETECTED Final   Salmonella species NOT DETECTED NOT DETECTED Final   Yersinia enterocolitica NOT DETECTED NOT DETECTED Final   Vibrio species NOT DETECTED NOT DETECTED Final   Vibrio cholerae NOT DETECTED NOT DETECTED Final   Enteroaggregative E coli (EAEC) NOT DETECTED NOT DETECTED Final   Enteropathogenic E coli (EPEC) NOT DETECTED NOT DETECTED Final   Enterotoxigenic E coli (ETEC) NOT DETECTED NOT DETECTED Final   Shiga like toxin producing E coli (STEC) NOT DETECTED NOT DETECTED Final   E. coli O157 NOT DETECTED NOT DETECTED Final   Shigella/Enteroinvasive E coli (EIEC) NOT DETECTED NOT DETECTED Final    Cryptosporidium NOT DETECTED NOT DETECTED Final   Cyclospora cayetanensis NOT DETECTED NOT DETECTED Final   Entamoeba histolytica NOT DETECTED NOT DETECTED Final   Giardia lamblia NOT DETECTED NOT DETECTED Final   Adenovirus F40/41 NOT DETECTED NOT DETECTED Final   Astrovirus NOT DETECTED NOT DETECTED Final   Norovirus GI/GII NOT DETECTED NOT DETECTED Final   Rotavirus A NOT DETECTED NOT DETECTED Final   Sapovirus (I, II, IV, and V) NOT DETECTED NOT DETECTED Final  Gastrointestinal Panel by PCR , Stool     Status: None   Collection Time: 08/02/15 11:32 AM  Result Value Ref Range Status   Campylobacter species NOT DETECTED NOT DETECTED Final   Plesimonas shigelloides NOT DETECTED NOT DETECTED Final   Salmonella species NOT DETECTED NOT DETECTED Final   Yersinia enterocolitica NOT DETECTED NOT DETECTED Final   Vibrio species NOT DETECTED NOT DETECTED Final   Vibrio cholerae NOT DETECTED NOT DETECTED Final   Enteroaggregative E coli (EAEC) NOT DETECTED NOT DETECTED Final   Enteropathogenic E coli (EPEC) NOT DETECTED NOT DETECTED Final   Enterotoxigenic E coli (ETEC) NOT DETECTED NOT DETECTED Final   Shiga like toxin producing E coli (STEC) NOT DETECTED NOT DETECTED Final   E. coli O157 NOT DETECTED NOT DETECTED Final   Shigella/Enteroinvasive E coli (EIEC) NOT DETECTED NOT DETECTED Final   Cryptosporidium NOT DETECTED NOT DETECTED Final   Cyclospora cayetanensis NOT DETECTED NOT DETECTED Final   Entamoeba histolytica NOT DETECTED NOT DETECTED Final   Giardia lamblia NOT DETECTED NOT DETECTED Final   Adenovirus F40/41 NOT DETECTED NOT DETECTED Final   Astrovirus NOT DETECTED NOT DETECTED Final   Norovirus GI/GII NOT DETECTED NOT DETECTED Final   Rotavirus A NOT DETECTED NOT DETECTED Final   Sapovirus (I, II, IV, and V) NOT DETECTED NOT DETECTED Final  C difficile quick scan w PCR reflex     Status: None   Collection Time: 08/02/15 11:32 AM  Result Value Ref Range Status   C Diff  antigen NEGATIVE NEGATIVE Final   C Diff toxin NEGATIVE NEGATIVE Final   C Diff interpretation Negative for C. difficile  Final     Studies: Ct Abdomen Pelvis Wo Contrast  08/03/2015  CLINICAL DATA:  Severe abdominal pain beginning 2 days ago. Diarrhea. Bloody stool. EXAM: CT ABDOMEN AND PELVIS WITHOUT CONTRAST TECHNIQUE: Multidetector CT imaging of the abdomen and pelvis was performed following the standard protocol without IV contrast. COMPARISON:  None. FINDINGS: Lower chest: No acute findings. Mild right basilar scarring and bronchiectasis noted. Tiny hiatal hernia noted as well as tiny left pleural effusion. Hepatobiliary: Probable mild hepatic steatosis. No  mass visualized on this un-enhanced exam. Gallbladder is unremarkable. Pancreas: No mass or inflammatory process identified on this un-enhanced exam. Spleen: Within normal limits in size. Adrenals/Urinary Tract: No evidence of urolithiasis or hydronephrosis. No definite mass visualized on this un-enhanced exam. Stomach/Bowel: No evidence of bowel obstruction. Long segment bowel wall thickening is seen involving the splenic flexure, descending, and rectosigmoid colon, consistent with colitis. Diverticulosis is seen mainly involving the sigmoid colon, however there is no evidence of short segment or localized diverticulitis. Small amount of free fluid seen in the pelvis, however no abscess identified. Vascular/Lymphatic: No pathologically enlarged lymph nodes. No evidence of abdominal aortic aneurysm. Reproductive: No mass or other significant abnormality. Other: None. Musculoskeletal:  No suspicious bone lesions identified. IMPRESSION: Moderate long segment colitis involving descending and rectosigmoid colon. Small amount of free fluid in pelvis.  No abscess identified. Mild hepatic steatosis and tiny hiatal hernia. Electronically Signed   By: Myles Rosenthal M.D.   On: 08/03/2015 18:19    Scheduled Meds: . ALPRAZolam  0.25 mg Oral TID  . amLODipine   5 mg Oral Daily  . [START ON 08/06/2015] diphenhydrAMINE  50 mg Oral Once  . ertapenem  1 g Intravenous Q24H  . famotidine (PEPCID) IV  20 mg Intravenous Q12H  . levothyroxine  100 mcg Oral QAC breakfast  . metronidazole  500 mg Intravenous Q8H  . pantoprazole  40 mg Oral Daily  . polyethylene glycol-electrolytes  4,000 mL Oral Once  . predniSONE  5 mg Oral QODAY  . predniSONE  50 mg Oral Q6H  . sodium chloride  3 mL Intravenous Q12H  . verapamil  240 mg Oral QHS   Continuous Infusions: . 0.9 % NaCl with KCl 20 mEq / L 40 mL/hr at 08/05/15 0522    Assessment/Plan:  1. Ischemic colitis with bloody diarrhea and left lower quadrant abdominal pain. Vascular surgery to order a CT angiogram with premedication. Dr. Mechele Collin to set up for colonoscopy on Tuesday. On empiric antibiotic. Hemoglobin stable. 2. Essential hypertension- stable on meds 3. Hypothyroidism unspecified on levothyroxine 4. Anxiety on Xanax 5. Hypertriglyceridemia 6. Vitamin D deficiency  Code Status:     Code Status Orders        Start     Ordered   08/02/15 0928  Full code   Continuous     08/02/15 0928    Code Status History    Date Active Date Inactive Code Status Order ID Comments User Context   This patient has a current code status but no historical code status.     Disposition Plan: Home soon depending on colonoscopy and CT angiogram results.  Antibiotics:  Ertapenem  Flagyl  Time spent: 20 minutes  Alford Highland  [facility]

## 2015-08-05 NOTE — Care Management (Signed)
Admitted to this facility with the diagnosis of GI Bleed. Lives alone. Daughter is [place] Large [phone]). No home Health. No skilled facility. Takes care of all basic and instrumental activities of daily living herself, drives. Last seen Dr. Doy Hutching 2 weeks ago. No falls. Decreased appetite since being sick, Friend/family will transport. Shelbie Ammons RN MSN CCM Care Management [phone]

## 2015-08-05 NOTE — Consult Note (Signed)
GI Inpatient Follow-up Note  Patient Identification: Kristi Barrera is a 75 y.o. female  with n/v. Diarrhea with rectal bleeding  Subjective: She is still experiencing intermittent bilateral lower and left sided abdominal pain. She reports she is still having episodes of diarrhea, approx a few minutes after eating, denies blood, describes as brown.  She has been eating full liquids, will start colon prep this afternoon along with clear liquids.  Is concerned that she is not getting her thyroid medication early enough in the morning.  Scheduled Inpatient Medications:  . ALPRAZolam  0.25 mg Oral TID  . amLODipine  5 mg Oral Daily  . ertapenem  1 g Intravenous Q24H  . levothyroxine  100 mcg Oral QAC breakfast  . metronidazole  500 mg Intravenous Q8H  . pantoprazole  40 mg Oral Daily  . polyethylene glycol-electrolytes  4,000 mL Oral Once  . predniSONE  5 mg Oral QODAY  . sodium chloride  3 mL Intravenous Q12H  . verapamil  240 mg Oral QHS    Continuous Inpatient Infusions:   . 0.9 % NaCl with KCl 20 mEq / L 40 mL/hr at 08/05/15 0522    PRN Inpatient Medications:  morphine injection, ondansetron (ZOFRAN) IV  Review of Systems: Constitutional: Weight is stable.  Eyes: No changes in vision. ENT: No oral lesions, sore throat.  GI: see HPI.  Heme/Lymph: No easy bruising.  CV: No chest pain.  GU: No hematuria.  Integumentary: No rashes.  Neuro: No headaches.  Psych: No depression/anxiety.  Endocrine: No heat/cold intolerance.  Allergic/Immunologic: No urticaria.  Resp: No cough, SOB.  Musculoskeletal: No joint swelling.    Physical Examination: BP 142/44 mmHg  Pulse 65  Temp(Src) 97.7 F (36.5 C) (Oral)  Resp 20  Ht 5\' 2"  (1.575 m)  Wt 72.576 kg (160 lb)  BMI 29.26 kg/m2  SpO2 92% Gen: NAD, alert and oriented x 4 HEENT: PEERLA, EOMI, Neck: supple, no JVD or thyromegaly Chest: CTA bilaterally, no wheezes, crackles, or other adventitious sounds CV: RRR, no  m/g/c/r Abd: soft, generalized tenderness, ND, +BS in all four quadrants; no HSM, guarding, ridigity, or rebound tenderness Ext: no edema, well perfused with 2+ pulses, Skin: no rash or lesions noted Lymph: no LAD  Data: Lab Results  Component Value Date   WBC 14.5* 08/03/2015   HGB 11.2* 08/04/2015   HCT 34.8* 08/03/2015   MCV 89.5 08/03/2015   PLT 208 08/03/2015    Recent Labs Lab 08/03/15 0131 08/03/15 0926 08/04/15 0457  HGB 11.7* 11.4* 11.2*   Lab Results  Component Value Date   NA 141 08/04/2015   K 3.7 08/04/2015   CL 111 08/04/2015   CO2 25 08/04/2015   BUN 8 08/04/2015   CREATININE 0.81 08/04/2015   Lab Results  Component Value Date   ALT 20 08/02/2015   AST 19 08/02/2015   ALKPHOS 54 08/02/2015   BILITOT 0.5 08/02/2015   No results for input(s): APTT, INR, PTT in the last 168 hours. Assessment/Plan: [name] is a 75 y.o. female with n/v/diarrhea with rectal bleeding. WBC on admission 12.5, currently 11.2, currently 14.5.(no new lab for WBC today) hgb stable at 11.4. Neg c.diff, neg GI panel by PCR  Vascular Surgery recommends CT angiogram with premedication  Colon prep to start this afternoon, colonoscopy tomorrow afternoon  Recommendations: We agree with current treatment plan. We will proceed with colonoscopy tomorrow.  We will continue to follow with you. Please call with questions or concerns.  Ashlyn Cabler  Elna Breslow, PA-C  I personally performed these services.

## 2015-08-06 ENCOUNTER — Encounter
Admission: EM | Disposition: A | Discharge status: Home / Self Care | Payer: Self-pay | Source: Home / Self Care | Attending: Internal Medicine

## 2015-08-06 ENCOUNTER — Inpatient Hospital Stay: Payer: Medicare Other | Admitting: Anesthesiology

## 2015-08-06 HISTORY — PX: COLONOSCOPY WITH PROPOFOL: SHX5780

## 2015-08-06 LAB — HEMOGLOBIN: HEMOGLOBIN: 11.6 g/dL — AB (ref 12.0–16.0)

## 2015-08-06 LAB — BASIC METABOLIC PANEL
ANION GAP: 5 (ref 5–15)
BUN: 8 mg/dL (ref 6–20)
CO2: 26 mmol/L (ref 22–32)
Calcium: 8.4 mg/dL — ABNORMAL LOW (ref 8.9–10.3)
Chloride: 110 mmol/L (ref 101–111)
Creatinine, Ser: 0.82 mg/dL (ref 0.44–1.00)
GFR calc non Af Amer: >60 mL/min (ref >60)
GLUCOSE: 89 mg/dL (ref 65–99)
POTASSIUM: 3.4 mmol/L — AB (ref 3.5–5.1)
Sodium: 141 mmol/L (ref 135–145)

## 2015-08-06 LAB — MAGNESIUM: Magnesium: 1.7 mg/dL (ref 1.7–2.4)

## 2015-08-06 SURGERY — COLONOSCOPY WITH PROPOFOL
Anesthesia: General

## 2015-08-06 MED ORDER — SODIUM CHLORIDE 0.9 % IV SOLN
INTRAVENOUS | Status: DC | PRN
  Administered 2015-08-06: 16:00:00 via INTRAVENOUS

## 2015-08-06 MED ORDER — DIPHENHYDRAMINE HCL 25 MG PO CAPS
50.0000 mg | ORAL_CAPSULE | Freq: Once | ORAL | Status: AC
  Administered 2015-08-07: 50 mg via ORAL
  Filled 2015-08-06: qty 1
  Filled 2015-08-06: qty 2

## 2015-08-06 MED ORDER — MIDAZOLAM HCL 2 MG/2ML IJ SOLN
INTRAMUSCULAR | Status: DC | PRN
  Administered 2015-08-06: 1 mg via INTRAVENOUS

## 2015-08-06 MED ORDER — PROPOFOL 500 MG/50ML IV EMUL
INTRAVENOUS | Status: DC | PRN
  Administered 2015-08-06: 100 ug/kg/min via INTRAVENOUS

## 2015-08-06 MED ORDER — DIPHENHYDRAMINE HCL 50 MG PO CAPS
50.0000 mg | ORAL_CAPSULE | Freq: Once | ORAL | Status: DC
  Filled 2015-08-06: qty 1

## 2015-08-06 MED ORDER — LIDOCAINE HCL (CARDIAC) 20 MG/ML IV SOLN
INTRAVENOUS | Status: DC | PRN
  Administered 2015-08-06: 80 mg via INTRAVENOUS

## 2015-08-06 MED ORDER — PREDNISONE 50 MG PO TABS
50.0000 mg | ORAL_TABLET | Freq: Four times a day (QID) | ORAL | Status: AC
  Administered 2015-08-06 – 2015-08-07 (×3): 50 mg via ORAL
  Filled 2015-08-06 (×6): qty 1

## 2015-08-06 MED ORDER — PROPOFOL 10 MG/ML IV BOLUS
INTRAVENOUS | Status: DC | PRN
  Administered 2015-08-06: 20 mg via INTRAVENOUS

## 2015-08-06 NOTE — Plan of Care (Addendum)
VSS, afebrile. CPAP worn through the night.  Problem: Safety: Goal: Ability to remain free from injury will improve Outcome: Progressing Moderate fall risk. Safely ambulates independently in room. Safe environment provided.   Problem: Health Behavior/Discharge Planning: Goal: Ability to manage health-related needs will improve Outcome: Progressing Pt from home alone. Has family/friends to help with any needs she's unable to do herself  Problem: Pain Managment: Goal: General experience of comfort will improve Outcome: Completed/Met Date Met:  08/06/15 No c/o pain  Problem: Skin Integrity: Goal: Risk for impaired skin integrity will decrease Outcome: Progressing Pt c/o rectum area is sore/reddened. Cleansed and barrier cream provided. Encouraged pt to use soft wipes.  Problem: Fluid Volume: Goal: Ability to maintain a balanced intake and output will improve Outcome: Progressing IVF infusing as order. Good output noted.  Problem: Bowel/Gastric: Goal: Will not experience complications related to bowel motility Outcome: Progressing Bowel prep to begin at 5am. Colonoscopy this afternoon.   Problem: Fluid Volume: Goal: Will show no signs and symptoms of excessive bleeding Outcome: Progressing No sign of bleeding noted

## 2015-08-06 NOTE — Progress Notes (Signed)
Pt c/o pain in left AC where previous IV was.  Area is red with a small knot.  Pt now has an IV in left FA.  Pain declined any pain meds. Offered pt ice pack for arm which pt did not want.  Offered pt new IV and pt also declined that as well.  Current IV is infusing with no difficulty and site looks great.  Will continue to monitor.  [name], RN

## 2015-08-06 NOTE — Progress Notes (Signed)
Initial Nutrition Assessment   INTERVENTION:   Coordination of Care: await diet progression as medically able Medical Food Supplement Therapy: if unable to advanced diet post procedures past liquids, recommend addition of nutritional supplement   NUTRITION DIAGNOSIS:   Inadequate oral intake related to acute illness, altered GI function as evidenced by NPO status.  GOAL:   Patient will meet greater than or equal to 90% of their needs  MONITOR:    (Energy Intake, Anthropometrics, Digestive System, Electrolyte/Renal Profile, )  REASON FOR ASSESSMENT:   NPO/Clear Liquid Diet    ASSESSMENT:    Pt admitted with ischemic colitis with bloody diarrhea and abdominal pain; colonoscopy today followed by CT angiogram  Past Medical History  Diagnosis Date  . GERD (gastroesophageal reflux disease)   . Palpitations   . Hypothyroidism   . HTN (hypertension)   . Hyperlipidemia   . Chest pain   . Sinusitis   . Recurrent UTI   . Chronic low back pain   . 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 Skin cancer   . Low blood potassium   . Vitamin D deficiency      Diet Order:  Diet NPO time specified Except for: Sips with Meds   Energy Intake: tolerated liquid diet, NPO/CL for most of admission, except brief period on FL  Food and nutrition related history: great appetite prior to admission, ate grilled chicken salad at [employer] the day of admission  Skin:  Reviewed, no issues  Last BM:  1/17   Digestive System: abdominal pain improving, +stool, no bleeding at present  Electrolyte and Renal Profile:  Recent Labs Lab 08/03/15 0926 08/04/15 0457 08/06/15 0503  BUN 8 8 8   CREATININE 0.77 0.81 0.82  NA 141 141 141  K 3.6 3.7 3.4*  MG 1.6*  --  1.7   Glucose Profile: No results for input(s): GLUCAP in the last 72 hours. Meds: NS with KCl at 40 ml/hr  Height:   Ht Readings from Last 1 Encounters:  08/02/15 5\' 2"  (1.575 m)    Weight: no  wt loss reported  Wt Readings from Last 1 Encounters:  08/02/15 160 lb (72.576 kg)    Wt Readings from Last 10 Encounters:  08/02/15 160 lb (72.576 kg)  03/03/15 155 lb (70.308 kg)  07/24/14 156 lb (70.761 kg)  10/04/13 151 lb (68.493 kg)  02/14/13 150 lb 12.8 oz (68.402 kg)  08/16/12 150 lb (68.04 kg)  02/29/12 147 lb (66.679 kg)  12/31/11 146 lb (66.225 kg)  11/17/11 147 lb 6.4 oz (66.86 kg)    BMI:  Body mass index is 29.26 kg/(m^2).  LOW Care Level  Kerman Passey MS, [place], LDN [phone] Pager  [phone] Weekend/On-Call Pager

## 2015-08-06 NOTE — Progress Notes (Signed)
Patient ID: Kristi Barrera, female   DOB: [birth year], 75 y.o.   MRN: [id number] [facility] PROGRESS NOTE  PCP: Marguarite Arbour, MD  HPI/Subjective: Patient has no bdominal pain or bloody stool. But complaint of worsening b/l leg edema after taking [place] for colonoscopy. Objective: Filed Vitals:   08/05/15 2123 08/06/15 0508  BP: 143/60 151/60  Pulse: 72 78  Temp: 98 F (36.7 C) 97.7 F (36.5 C)  Resp: 18 16    Filed Weights   08/02/15 0705 08/02/15 1134  Weight: 68.947 kg (152 lb) 72.576 kg (160 lb)    ROS: Review of Systems  Constitutional: Negative for fever and chills.  Eyes: Negative for blurred vision.  Respiratory: Negative for cough and shortness of breath.   Cardiovascular: Negative for chest pain.  Gastrointestinal: Negative for nausea, vomiting, abdominal pain, diarrhea, constipation and blood in stool.  Genitourinary: Negative for dysuria.  Musculoskeletal: Negative for joint pain.  Neurological: Negative for dizziness and headaches.   Exam: Physical Exam  Constitutional: She is oriented to person, place, and time.  HENT:  Nose: No mucosal edema.  Mouth/Throat: No oropharyngeal exudate or posterior oropharyngeal edema.  Eyes: Conjunctivae, EOM and lids are normal. Pupils are equal, round, and reactive to light.  Neck: No JVD present. Carotid bruit is not present. No edema present. No thyroid mass and no thyromegaly present.  Cardiovascular: S1 normal and S2 normal.  Exam reveals no gallop.   No murmur heard. Pulses:      Dorsalis pedis pulses are 2+ on the right side, and 2+ on the left side.  Respiratory: No respiratory distress. She has no wheezes. She has no rhonchi. She has no rales.  GI: Soft. Bowel sounds are normal. There is no tenderness.  Musculoskeletal:       Right ankle: She exhibits swelling.       Left ankle: She exhibits swelling.  Lymphadenopathy:    She has no cervical adenopathy.  Neurological: She is alert and  oriented to person, place, and time. No cranial nerve deficit.  Skin: Skin is warm. No rash noted. Nails show no clubbing.  Psychiatric: She has a normal mood and affect.    Data Reviewed: Basic Metabolic Panel:  Recent Labs Lab 08/02/15 0716 08/03/15 0926 08/04/15 0457 08/06/15 0503  NA 137 141 141 141  K 3.4* 3.6 3.7 3.4*  CL 104 112* 111 110  CO2 24 22 25 26   GLUCOSE 116* 125* 87 89  BUN 15 8 8 8   CREATININE 0.76 0.77 0.81 0.82  CALCIUM 8.8* 8.3* 8.2* 8.4*  MG  --  1.6*  --  1.7   Liver Function Tests:  Recent Labs Lab 08/02/15 0716  AST 19  ALT 20  ALKPHOS 54  BILITOT 0.5  PROT 7.0  ALBUMIN 4.1   CBC:  Recent Labs Lab 08/02/15 0716  08/02/15 1753 08/03/15 0131 08/03/15 0926 08/04/15 0457 08/06/15 0503  WBC 12.5*  --   --   --  14.5*  --   --   HGB 13.3  < > 12.7 11.7* 11.4* 11.2* 11.6*  HCT 39.7  --   --   --  34.8*  --   --   MCV 88.1  --   --   --  89.5  --   --   PLT 255  --   --   --  208  --   --   < > = values in this interval not displayed. CBG:  Recent  Labs Lab 08/02/15 1656  GLUCAP 121*    Recent Results (from the past 240 hour(s))  Gastrointestinal Panel by PCR , Stool     Status: None   Collection Time: 08/02/15  9:10 AM  Result Value Ref Range Status   Campylobacter species NOT DETECTED NOT DETECTED Final   Plesimonas shigelloides NOT DETECTED NOT DETECTED Final   Salmonella species NOT DETECTED NOT DETECTED Final   Yersinia enterocolitica NOT DETECTED NOT DETECTED Final   Vibrio species NOT DETECTED NOT DETECTED Final   Vibrio cholerae NOT DETECTED NOT DETECTED Final   Enteroaggregative E coli (EAEC) NOT DETECTED NOT DETECTED Final   Enteropathogenic E coli (EPEC) NOT DETECTED NOT DETECTED Final   Enterotoxigenic E coli (ETEC) NOT DETECTED NOT DETECTED Final   Shiga like toxin producing E coli (STEC) NOT DETECTED NOT DETECTED Final   E. coli O157 NOT DETECTED NOT DETECTED Final   Shigella/Enteroinvasive E coli (EIEC) NOT  DETECTED NOT DETECTED Final   Cryptosporidium NOT DETECTED NOT DETECTED Final   Cyclospora cayetanensis NOT DETECTED NOT DETECTED Final   Entamoeba histolytica NOT DETECTED NOT DETECTED Final   Giardia lamblia NOT DETECTED NOT DETECTED Final   Adenovirus F40/41 NOT DETECTED NOT DETECTED Final   Astrovirus NOT DETECTED NOT DETECTED Final   Norovirus GI/GII NOT DETECTED NOT DETECTED Final   Rotavirus A NOT DETECTED NOT DETECTED Final   Sapovirus (I, II, IV, and V) NOT DETECTED NOT DETECTED Final  Gastrointestinal Panel by PCR , Stool     Status: None   Collection Time: 08/02/15 11:32 AM  Result Value Ref Range Status   Campylobacter species NOT DETECTED NOT DETECTED Final   Plesimonas shigelloides NOT DETECTED NOT DETECTED Final   Salmonella species NOT DETECTED NOT DETECTED Final   Yersinia enterocolitica NOT DETECTED NOT DETECTED Final   Vibrio species NOT DETECTED NOT DETECTED Final   Vibrio cholerae NOT DETECTED NOT DETECTED Final   Enteroaggregative E coli (EAEC) NOT DETECTED NOT DETECTED Final   Enteropathogenic E coli (EPEC) NOT DETECTED NOT DETECTED Final   Enterotoxigenic E coli (ETEC) NOT DETECTED NOT DETECTED Final   Shiga like toxin producing E coli (STEC) NOT DETECTED NOT DETECTED Final   E. coli O157 NOT DETECTED NOT DETECTED Final   Shigella/Enteroinvasive E coli (EIEC) NOT DETECTED NOT DETECTED Final   Cryptosporidium NOT DETECTED NOT DETECTED Final   Cyclospora cayetanensis NOT DETECTED NOT DETECTED Final   Entamoeba histolytica NOT DETECTED NOT DETECTED Final   Giardia lamblia NOT DETECTED NOT DETECTED Final   Adenovirus F40/41 NOT DETECTED NOT DETECTED Final   Astrovirus NOT DETECTED NOT DETECTED Final   Norovirus GI/GII NOT DETECTED NOT DETECTED Final   Rotavirus A NOT DETECTED NOT DETECTED Final   Sapovirus (I, II, IV, and V) NOT DETECTED NOT DETECTED Final  C difficile quick scan w PCR reflex     Status: None   Collection Time: 08/02/15 11:32 AM  Result Value  Ref Range Status   C Diff antigen NEGATIVE NEGATIVE Final   C Diff toxin NEGATIVE NEGATIVE Final   C Diff interpretation Negative for C. difficile  Final     Studies: No results found.  Scheduled Meds: . [MAR Hold] ALPRAZolam  0.25 mg Oral TID  . [MAR Hold] amLODipine  5 mg Oral Daily  . [MAR Hold] ertapenem  1 g Intravenous Q24H  . [MAR Hold] famotidine (PEPCID) IV  20 mg Intravenous Q12H  . [MAR Hold] levothyroxine  100 mcg Oral QAC breakfast  . [  MAR Hold] metronidazole  500 mg Intravenous Q8H  . [MAR Hold] pantoprazole  40 mg Oral Daily  . [MAR Hold] predniSONE  5 mg Oral QODAY  . [MAR Hold] sodium chloride  3 mL Intravenous Q12H  . [MAR Hold] verapamil  240 mg Oral QHS   Continuous Infusions: . 0.9 % NaCl with KCl 20 mEq / L 40 mL/hr at 08/06/15 1047    Assessment/Plan:  1. Ischemic colitis with bloody diarrhea and left lower quadrant abdominal pain. Vascular surgery to order a CT angiogram with premedication. Dr. Mechele Collin will do colonoscopy today. On empiric antibiotic. Hemoglobin stable. 2. Essential hypertension- stable on meds 3. Hypothyroidism unspecified on levothyroxine 4. Anxiety on Xanax 5. Hypertriglyceridemia 6. Vitamin D deficiency  Code Status:     Code Status Orders        Start     Ordered   08/02/15 0928  Full code   Continuous     08/02/15 0928    Code Status History    Date Active Date Inactive Code Status Order ID Comments User Context   This patient has a current code status but no historical code status.     Disposition Plan: may discharge to home tomorrow after CT angiogram results. Greater than 50% time was spent on coordination of care and face-to-face counseling. Discussed with Dr. Markham Jordan and her husband.  Antibiotics:  Ertapenem  Flagyl  Time spent: 35 minutes  Shaune Pollack  [facility]

## 2015-08-06 NOTE — Anesthesia Postprocedure Evaluation (Signed)
Anesthesia Post Note  Patient: Kristi Barrera  Procedure(s) Performed: Procedure(s) (LRB): COLONOSCOPY WITH PROPOFOL (N/A)  Patient location during evaluation: Endoscopy Anesthesia Type: General Level of consciousness: awake and alert Pain management: pain level controlled Vital Signs Assessment: post-procedure vital signs reviewed and stable Respiratory status: spontaneous breathing and respiratory function stable Cardiovascular status: stable Anesthetic complications: no    Last Vitals:  Filed Vitals:   08/06/15 1744 08/06/15 2103  BP: 152/61 159/72  Pulse: 75 80  Temp: 36.8 C 36.6 C  Resp: 20 20    Last Pain:  Filed Vitals:   08/06/15 2104  PainSc: 0-No pain                 Teyla Skidgel K

## 2015-08-06 NOTE — Anesthesia Postprocedure Evaluation (Deleted)
Anesthesia Post Note  Patient: Kristi Barrera  Procedure(s) Performed: Procedure(s) (LRB): COLONOSCOPY WITH PROPOFOL (N/A)  Patient location during evaluation: Endoscopy Anesthesia Type: General Level of consciousness: awake and alert Pain management: pain level controlled Vital Signs Assessment: post-procedure vital signs reviewed and stable Respiratory status: spontaneous breathing and respiratory function stable Cardiovascular status: stable Anesthetic complications: no    Last Vitals:  Filed Vitals:   08/06/15 1744 08/06/15 2103  BP: 152/61 159/72  Pulse: 75 80  Temp: 36.8 C 36.6 C  Resp: 20 20    Last Pain:  Filed Vitals:   08/06/15 2104  PainSc: 0-No pain                 KEPHART,WILLIAM K

## 2015-08-06 NOTE — Consult Note (Signed)
Patient had ischemic colitis with grannular mucosa and whitish superficial ulcerated plaques beginning in sigmoid/descending colon and running up most of the descending colon.  No blood seen, no black mucosa, appears to not be severe at this time.

## 2015-08-06 NOTE — Progress Notes (Addendum)
Pt refused orthostatic vitals.  Will attempt again later in shift. Pt feeling bad due to prep for procedure. [name], RN

## 2015-08-06 NOTE — Op Note (Signed)
[facility] Patient Name: Kristi Barrera Procedure Date: 08/06/2015 8:37 AM MRN: [id number] Account #: [id number] Date of Birth: May 16, 1941 Admit Type: Outpatient Age: 75 Room: [facility] ENDO ROOM 4 Gender: Female Note Status: Finalized Procedure:         Colonoscopy Indications:       Rectal bleeding, Abnormal CT of the GI tract Providers:         Scot Jun, MD Referring MD:      Jillene Bucks. Arlana Pouch, MD (Referring MD) Medicines:         Propofol per Anesthesia Complications:     No immediate complications. Procedure:         Pre-Anesthesia Assessment:                    - After reviewing the risks and benefits, the patient was                     deemed in satisfactory condition to undergo the procedure.                    After obtaining informed consent, the colonoscope was                     passed under direct vision. Throughout the procedure, the                     patient's blood pressure, pulse, and oxygen saturations                     were monitored continuously. The Colonoscope was                     introduced through the anus and advanced to the the                     splenic flexure for evaluation. This was the intended                     extent. The colonoscopy was somewhat difficult due to                     restricted mobility of the colon. The patient tolerated                     the procedure well. The quality of the bowel preparation                     was good. Findings:      Discontinuous areas of nonbleeding ulcerated mucosa with no stigmata of       recent bleeding were present in the proximal sigmoid colon and in the       descending colon. Biopsies were taken with a cold forceps for histology.       The mucosa showed multiple ulcerations in descending colon without       bleeding, no blood present. No deep ulcerrations seen. Due to findings I       did not atttempt to pass he scope to the cecum. Impression:         - Mucosal ulceration. Biopsied. Recommendation:    - Await pathology results. Scot Jun, MD 08/06/2015 4:02:32 PM This report has been signed electronically. Number of Addenda: 0 Note Initiated On: 08/06/2015 8:37 AM Scope Withdrawal Time: 0 hours 19 minutes 18  seconds  Total Procedure Duration: 0 hours 58 minutes 53 seconds       [facility]

## 2015-08-06 NOTE — Transfer of Care (Signed)
Immediate Anesthesia Transfer of Care Note  Patient: Kristi Barrera  Procedure(s) Performed: Procedure(s): COLONOSCOPY WITH PROPOFOL (N/A)  Patient Location: PACU  Anesthesia Type:General  Level of Consciousness: awake, alert  and oriented  Airway & Oxygen Therapy: Patient Spontanous Breathing and Patient connected to nasal cannula oxygen  Post-op Assessment: Report given to RN and Post -op Vital signs reviewed and stable  Post vital signs: Reviewed and stable  Last Vitals:  Filed Vitals:   08/05/15 2123 08/06/15 0508  BP: 143/60 151/60  Pulse: 72 78  Temp: 36.7 C 36.5 C  Resp: 18 16    Complications: No apparent anesthesia complications

## 2015-08-06 NOTE — Anesthesia Procedure Notes (Signed)
Date/Time: 08/06/2015 3:34 PM Performed by: Johnna Acosta Pre-anesthesia Checklist: Patient identified, Suction available, Emergency Drugs available and Patient being monitored Patient Re-evaluated:Patient Re-evaluated prior to inductionOxygen Delivery Method: Nasal cannula

## 2015-08-06 NOTE — Anesthesia Preprocedure Evaluation (Signed)
Anesthesia Evaluation  Patient identified by MRN, date of birth, ID band Patient awake    Reviewed: Allergy & Precautions, NPO status , Patient's Chart, lab work & pertinent test results  Airway Mallampati: III       Dental  (+) Upper Dentures   Pulmonary neg pulmonary ROS, sleep apnea, Continuous Positive Airway Pressure Ventilation and Oxygen sleep apnea ,           Cardiovascular hypertension, Pt. on medications      Neuro/Psych negative neurological ROS     GI/Hepatic Neg liver ROS, GERD  Medicated,  Endo/Other  Hypothyroidism   Renal/GU negative Renal ROS     Musculoskeletal   Abdominal   Peds  Hematology negative hematology ROS (+)   Anesthesia Other Findings   Reproductive/Obstetrics                             Anesthesia Physical Anesthesia Plan  ASA: III  Anesthesia Plan: General   Post-op Pain Management:    Induction: Intravenous  Airway Management Planned: Nasal Cannula  Additional Equipment:   Intra-op Plan:   Post-operative Plan:   Informed Consent: I have reviewed the patients History and Physical, chart, labs and discussed the procedure including the risks, benefits and alternatives for the proposed anesthesia with the patient or authorized representative who has indicated his/her understanding and acceptance.     Plan Discussed with:   Anesthesia Plan Comments:         Anesthesia Quick Evaluation

## 2015-08-07 ENCOUNTER — Inpatient Hospital Stay: Payer: Medicare Other

## 2015-08-07 MED ORDER — IOHEXOL 350 MG/ML SOLN
100.0000 mL | Freq: Once | INTRAVENOUS | Status: AC | PRN
  Administered 2015-08-07: 09:00:00 100 mL via INTRAVENOUS

## 2015-08-07 MED ORDER — METRONIDAZOLE 500 MG PO TABS: 500.0000 mg | ORAL_TABLET | Freq: Three times a day (TID) | ORAL | Status: DC

## 2015-08-07 NOTE — Care Management Important Message (Signed)
Important Message  Patient Details  Name: [name] MRN: [id number] Date of Birth: Jul 06, 1941   Medicare Important Message Given:  Yes    Juliann Pulse A Aharon Carriere 08/07/2015, 10:05 AM

## 2015-08-07 NOTE — Plan of Care (Signed)
Problem: Safety: Goal: Ability to remain free from injury will improve Outcome: Progressing Patient is a moderate fall risk.  She is up in room and steady on feet.  Bed alarm on throughout shift.  Patient compliant with calling for help.    Problem: Health Behavior/Discharge Planning: Goal: Ability to manage health-related needs will improve Outcome: Progressing VSS, afebrile. CPAP worn through the night.  BP 133/63 mmHg  Pulse 74  Temp(Src) 98.4 F (36.9 C) (Oral)  Resp 16  Ht 5\' 2"  (1.575 m)  Wt 160 lb (72.576 kg)  BMI 29.26 kg/m2  SpO2 94%   Problem: Fluid Volume: Goal: Ability to maintain a balanced intake and output will improve Outcome: Progressing IVF infusing per eMAR.

## 2015-08-07 NOTE — Discharge Instructions (Signed)
Heart healthy diet. °Activity as tolerated. °

## 2015-08-07 NOTE — Consult Note (Signed)
Patient doing well.  CT angiogram did not show major blockages.  I think she can go home and follow up my office one to two weeks.

## 2015-08-07 NOTE — Progress Notes (Signed)
Call Dr. Delana Meyer and made him aware that pt would like to speak with him before discharging.  MD will be by later to see pt.  [name], RN

## 2015-08-07 NOTE — Progress Notes (Signed)
Pt was waiting to speak to Dr. Delana Meyer about CT results.  However, did not want to want any longer and left without speak to MD.  Dr. Bridgett Larsson and Dr. Vira Agar ok with pt discharging.  Discussed discharge instruction and medications with pt and her daughter.  Gave Rx and made aware of follow up appointments. Pt made aware to call Dr. Bunnie Domino office tomorrow and make appointment.  Pt transported home via car by daughter.  [name], RN

## 2015-08-07 NOTE — Discharge Summary (Signed)
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 SECONDARY DIAGNOSIS:   Past Medical History  Diagnosis Date  . GERD (gastroesophageal reflux disease)   . Palpitations   . Hypothyroidism   . HTN (hypertension)   . Hyperlipidemia   . Chest pain   . Sinusitis   . Recurrent UTI   . Chronic low back pain   . Sarcoidosis (HCC)     [place]---[facility] 15-31yrs ago  . Skin cancer   . Low blood potassium   . Vitamin D deficiency     HOSPITAL COURSE:   1. Ischemic colitis with bloody diarrhea and left lower quadrant abdominal pain.  The patient has been treated with Invanz and Flagyl after admission. Her symptoms has much improved. She has no bloody stool for the past the 3 days. Her hemoglobin is stable. CT angiogram didn't show any blockage, but showed Fibromuscular dysplasia involving both renal arteries. 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 This is incidental and highly unlikely to be of clinical significance. Follow up with Dr. Wyn Quaker as outpatient. Dr. Mechele Collin did colonoscopy, ischemic colitis with grannular mucosa and whitish superficial ulcerated plaques beginning in sigmoid/descending colon and running up most of the descending colon. Dr. Markham Jordan suggested Flagyl po 3 times a day and Cipro twice a day for 5 days. But the patient is allergic to ofloxacin, unable to give cipro.   2. Essential hypertension- stable on norvasc and  verapamil. I will hold benicar and benazepril due to fibromuscular dysplasia involving both renal arteries. Follow-up PCP for hypertension medication adjustment. 3. Hypothyroidism unspecified on levothyroxine 4. Anxiety on Xanax 5. Hypertriglyceridemia 6. Vitamin D deficiency  DISCHARGE CONDITIONS:   Stable, discharge to home today.  CONSULTS OBTAINED:  Treatment Team:  Alford Highland, MD Annice Needy, MD  DRUG ALLERGIES:   Allergies  Allergen Reactions  . Iohexol Shortness Of Breath  . Clonidine Derivatives     Swelling, chest tightness  . Floxin [Ofloxacin] Other (See Comments)    Hallucinations   . Hydralazine Hcl     BP dropped, HR increased, weakness, nausea, syncope  . Lotrel [Amlodipine Besy-Benazepril Hcl] Itching  . Potassium-Containing Compounds Other (See Comments)    Patient states she is allergic to oral Potassium Chloride only, had chest pains and a slight rash   . Sulfa Antibiotics   . Toprol Xl [Metoprolol Succinate]     fatigue    DISCHARGE MEDICATIONS:   Current Discharge Medication List    START taking these medications   Details  metroNIDAZOLE (FLAGYL) 500 MG tablet Take 1 tablet (500 mg total) by mouth 3 (three) times daily. Qty: 15 tablet, Refills: 0      CONTINUE these medications which have NOT CHANGED   Details  ALPRAZolam (XANAX) 0.25 MG tablet Take 0.25 mg by mouth 3 (three) times daily.     amLODipine (NORVASC) 5 MG tablet Take 5 mg by mouth daily.    levothyroxine (SYNTHROID) 100 MCG tablet Take 100 mcg by mouth daily.    predniSONE (DELTASONE)  10 MG tablet Take 5 mg by mouth every other day. At lunchtime    verapamil (VERELAN PM) 240 MG 24 hr capsule Take 240 mg by mouth at bedtime.    esomeprazole (NEXIUM) 40 MG capsule Take 40 mg by mouth daily at 12 noon. Reported on 08/06/2015    fenofibrate micronized (LOFIBRA) 134 MG capsule Take 134 mg by mouth Daily. Reported on 08/02/2015    potassium chloride SA (K-DUR,KLOR-CON) 20 MEQ  tablet Take 20 mEq by mouth daily. Reported on 08/02/2015      STOP taking these medications     benazepril (LOTENSIN) 40 MG tablet      olmesartan (BENICAR) 40 MG tablet          DISCHARGE INSTRUCTIONS:   If you experience worsening of your admission symptoms, develop shortness of breath, life threatening emergency, suicidal or homicidal thoughts you must seek medical attention immediately by calling 911 or calling your MD immediately  if symptoms less severe.  You Must read complete instructions/literature along with all the possible adverse reactions/side effects for all the Medicines you take and that have been prescribed to you. Take any new Medicines after you have completely understood and accept all the possible adverse reactions/side effects.   Please note  You were cared for by a hospitalist during your hospital stay. If you have any questions about your discharge medications or the care you received while you were in the hospital after you are discharged, you can call the unit and asked to speak with the hospitalist on call if the hospitalist that took care of you is not available. Once you are discharged, your primary care physician will handle any further medical issues. Please note that NO REFILLS for any discharge medications will be authorized once you are discharged, as it is imperative that you return to your primary care physician (or establish a relationship with a primary care physician if you do not have one) for your aftercare needs so that they can reassess your need for medications and monitor your lab values.    Today   SUBJECTIVE   No complaint.   VITAL SIGNS:  Blood pressure 151/76, pulse 82, temperature 97.7 F (36.5 C), temperature source Oral, resp. rate 18, height 5\' 2"  (1.575 m), weight 72.576 kg (160 lb), SpO2 96 %.  I/O:   Intake/Output Summary (Last 24 hours) at 08/07/15 1707 Last data filed at 08/07/15 1339  Gross per 24 hour  Intake    240  ml  Output      0 ml  Net    240 ml    PHYSICAL EXAMINATION:  GENERAL:  75 y.o.-year-old patient lying in the bed with no acute distress.  EYES: Pupils equal, round, reactive to light and accommodation. No scleral icterus. Extraocular muscles intact.  HEENT: Head atraumatic, normocephalic. Oropharynx and nasopharynx clear.  NECK:  Supple, no jugular venous distention. No thyroid enlargement, no tenderness.  LUNGS: Normal breath sounds bilaterally, no wheezing, rales,rhonchi or crepitation. No use of accessory muscles of respiration.  CARDIOVASCULAR: S1, S2 normal. No murmurs, rubs, or gallops.  ABDOMEN: Soft, non-tender, non-distended. Bowel sounds present. No organomegaly or mass.  EXTREMITIES: No pedal edema, cyanosis, or clubbing.  NEUROLOGIC: Cranial nerves II through XII are intact. Muscle strength 5/5 in all extremities. Sensation intact. Gait not checked.  PSYCHIATRIC: The patient is alert and oriented x 3.  SKIN: No obvious rash, lesion, or ulcer.   DATA REVIEW:   CBC  Recent Labs Lab 08/03/15 [id number]  08/06/15 0503  WBC 14.5*  --   --   HGB 11.4*  < > 11.6*  HCT 34.8*  --   --   PLT 208  --   --   < > = values in this interval not displayed.  Chemistries   Recent Labs Lab 08/02/15 0716  08/06/15 0503  NA 137  < > 141  K 3.4*  < > 3.4*  CL 104  < > 110  CO2 24  < > 26  GLUCOSE 116*  < > 89  BUN 15  < > 8  CREATININE 0.76  < > 0.82  CALCIUM 8.8*  < > 8.4*  MG  --   < > 1.7  AST 19  --   --   ALT 20  --   --   ALKPHOS 54  --   --   BILITOT 0.5  --   --   < > = values in this interval not displayed.  Cardiac Enzymes No results for input(s): TROPONINI in the last 168 hours.  Microbiology Results  Results for orders placed or performed during the hospital encounter of 08/02/15  Gastrointestinal Panel by PCR , Stool     Status: None   Collection Time: 08/02/15  9:10 AM  Result Value Ref Range Status   Campylobacter species NOT DETECTED NOT DETECTED Final    Plesimonas shigelloides NOT DETECTED NOT DETECTED Final   Salmonella species NOT DETECTED NOT DETECTED Final   Yersinia enterocolitica NOT DETECTED NOT DETECTED Final   Vibrio species NOT DETECTED NOT DETECTED Final   Vibrio cholerae NOT DETECTED NOT DETECTED Final   Enteroaggregative E coli (EAEC) NOT DETECTED NOT DETECTED Final   Enteropathogenic E coli (EPEC) NOT DETECTED NOT DETECTED Final   Enterotoxigenic E coli (ETEC) NOT DETECTED NOT DETECTED Final   Shiga like toxin producing E coli (STEC) NOT DETECTED NOT DETECTED Final   E. coli O157 NOT DETECTED NOT DETECTED Final   Shigella/Enteroinvasive E coli (EIEC) NOT DETECTED NOT DETECTED Final   Cryptosporidium NOT DETECTED NOT DETECTED Final   Cyclospora cayetanensis NOT DETECTED NOT DETECTED Final   Entamoeba histolytica NOT DETECTED NOT DETECTED Final   Giardia lamblia NOT DETECTED NOT DETECTED Final   Adenovirus F40/41 NOT DETECTED NOT DETECTED Final   Astrovirus NOT DETECTED NOT DETECTED Final   Norovirus GI/GII NOT DETECTED NOT DETECTED Final   Rotavirus A NOT DETECTED NOT DETECTED Final   Sapovirus (I, II, IV, and V) NOT DETECTED NOT DETECTED Final  Gastrointestinal Panel by PCR , Stool     Status: None   Collection Time: 08/02/15 11:32 AM  Result Value Ref Range Status   Campylobacter species NOT DETECTED NOT DETECTED Final   Plesimonas shigelloides NOT DETECTED NOT DETECTED Final   Salmonella species NOT DETECTED NOT DETECTED Final   Yersinia enterocolitica NOT DETECTED NOT DETECTED Final   Vibrio species NOT DETECTED NOT DETECTED Final   Vibrio cholerae NOT DETECTED NOT DETECTED Final   Enteroaggregative E coli (EAEC) NOT DETECTED NOT DETECTED Final   Enteropathogenic E coli (EPEC) NOT DETECTED NOT DETECTED Final   Enterotoxigenic E coli (ETEC) NOT DETECTED NOT DETECTED Final   Shiga like toxin producing E coli (STEC) NOT DETECTED NOT DETECTED Final   E. coli O157 NOT DETECTED NOT DETECTED Final    Shigella/Enteroinvasive E coli (EIEC) NOT DETECTED NOT DETECTED Final   Cryptosporidium NOT DETECTED NOT DETECTED Final   Cyclospora cayetanensis NOT DETECTED NOT DETECTED Final   Entamoeba  histolytica NOT DETECTED NOT DETECTED Final   Giardia lamblia NOT DETECTED NOT DETECTED Final   Adenovirus F40/41 NOT DETECTED NOT DETECTED Final   Astrovirus NOT DETECTED NOT DETECTED Final   Norovirus GI/GII NOT DETECTED NOT DETECTED Final   Rotavirus A NOT DETECTED NOT DETECTED Final   Sapovirus (I, II, IV, and V) NOT DETECTED NOT DETECTED Final  C difficile quick scan w PCR reflex     Status: None   Collection Time: 08/02/15 11:32 AM  Result Value Ref Range Status   C Diff antigen NEGATIVE NEGATIVE Final   C Diff toxin NEGATIVE NEGATIVE Final   C Diff interpretation Negative for C. difficile  Final    RADIOLOGY:  Ct Angio Abd/pel W/ And/or W/o  08/07/2015  CLINICAL DATA:  75 year old female with ischemic colitis involving the sigmoid and descending colon. Initial encounter. Of note, patient has a contrast allergy and was appropriately pre-medicated for 13 hours. EXAM: CTA ABDOMEN AND PELVIS WITHOUT AND WITH CONTRAST TECHNIQUE: Multidetector CT imaging of the abdomen and pelvis was performed using the standard protocol during bolus administration of intravenous contrast. Multiplanar reconstructed images and MIPs were obtained and reviewed to evaluate the vascular anatomy. CONTRAST:  OMNIPAQUE IOHEXOL 350 MG/ML SOLN COMPARISON:  Prior CT abdomen/ pelvis 08/03/2015 FINDINGS: VASCULAR Aorta: Normal caliber abdominal aorta with scattered atherosclerotic vascular calcifications. No evidence of dissection or penetrating ulcer. No irregular plaque or wall adherent thrombus. Celiac: Moderate focal stenosis of the proximal celiac artery approximately 1.8 cm beyond the origin secondary to either focal fibro fatty plaque or FMD. The distal branches remain widely patent. Conventional hepatic arterial anatomy. No  splenic or hepatic artery aneurysm. SMA: Widely patent. Renals: Bilateral solitary dominant renal arteries. No significant atherosclerotic plaque or evidence of stenosis at the origins. There is a beaded appearance of the mid and distal main renal arteries bilaterally most consistent with fibromuscular dysplasia. No associated renal artery dissection or aneurysm. There is mild ectasia of the left main renal artery just proximal to the main bifurcation. The vessel measures up to 9 mm compared to 4 mm at the origin. IMA: Patent. No evidence of visible stenosis or fibromuscular dysplasia. Inflow: Minimal atherosclerotic plaque without stenosis. Proximal Outflow: The vessels are within normal limits. Veins: No focal venous abnormality. NON-VASCULAR Lower Chest: The visualized cardiac structures are within normal limits for size. No pericardial effusion. Unremarkable visualized distal thoracic esophagus. Linear atelectasis versus scarring in the right lower lobe. Otherwise, the visualized lower lungs are clear. Abdomen: Unremarkable CT appearance of the stomach, duodenum, spleen, adrenal glands and pancreas. Normal hepatic contour and morphology. Geographic hypoattenuation in the left hemi-liver adjacent to the fissure for the falciform ligament is nonspecific but most suggestive of benign focal fatty infiltration. Gallbladder is unremarkable. No intra or extrahepatic biliary ductal dilatation. Unremarkable appearance of the bilateral kidneys. No focal solid lesion, hydronephrosis or nephrolithiasis. Sigmoid and descending colonic diverticulosis without evidence of active diverticulitis. No focal bowel wall thickening. There is been significant interval improvement in the previously noted submucosal edema and inflammatory stranding about the descending and sigmoid colon. No evidence of obstruction. No free fluid or suspicious adenopathy. Pelvis: Mild pelvic floor laxity. Unremarkable bladder, uterus and adnexa. No free  fluid or suspicious adenopathy. Bones/Soft Tissues: No acute fracture or aggressive appearing lytic or blastic osseous lesion. Small fat containing umbilical hernia. Review of the MIP images confirms the above findings. IMPRESSION: VASCULAR 1. No arterial findings to suggest a source for either acute or chronic mesenteric  ischemia. 2. Fibromuscular dysplasia involving both renal arteries. Does the patient have medically refractory (poorly controlled, or controlled on 4 or more medications) hypertension? If so, consider referral to Interventional Radiology or other endovascular specialist for further evaluation and management. 3. 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 This is likely secondary to the underlying FMD. 4. 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 This is incidental and highly unlikely to be of clinical significance. NON VASCULAR 1. Significant interval improvement in left-sided colitis compared to 08/03/2015. There is minimal if any residual submucosal edema and only trace residual pericolonic inflammation. 2. Additional ancillary findings as above without significant interval change. Signed, Sterling Big, MD [facility] Electronically Signed   By: Malachy Moan M.D.   On: 08/07/2015 10:53       Discussed with Dr. Markham Jordan. Management plans discussed with the patient, her husband and daughter and they are in agreement.  CODE STATUS:     Code Status Orders        Start     Ordered   08/02/15 0928  Full code   Continuous     08/02/15 0928    Code Status History    Date Active Date Inactive Code Status Order ID Comments User Context   This patient has a current code status but no historical code status.      TOTAL TIME TAKING CARE OF THIS PATIENT: 37 minutes.     Shaune Pollack M.D on 08/07/2015 at 5:07 PM  Between 7am to 6pm - Pager - [phone]  After 6pm go to [email or web address] - password [email or web address] [facility]  [place] [facility]  Office  [phone]  CC: Primary care physician; Marguarite Arbour, MD

## 2015-08-08 LAB — SURGICAL PATHOLOGY

## 2015-12-18 ENCOUNTER — Other Ambulatory Visit: Payer: Self-pay | Admitting: Internal Medicine

## 2015-12-18 DIAGNOSIS — Z1231 Encounter for screening mammogram for malignant neoplasm of breast: Secondary | ICD-10-CM

## 2016-01-14 ENCOUNTER — Encounter: Payer: Self-pay | Admitting: *Deleted

## 2016-01-15 ENCOUNTER — Encounter
Admission: RE | Disposition: A | Discharge status: Home / Self Care | Payer: Self-pay | Source: Ambulatory Visit | Attending: Unknown Physician Specialty

## 2016-01-15 ENCOUNTER — Ambulatory Visit: Payer: Medicare Other | Admitting: Anesthesiology

## 2016-01-15 ENCOUNTER — Encounter: Payer: Self-pay | Admitting: *Deleted

## 2016-01-15 ENCOUNTER — Ambulatory Visit
Admission: RE | Admit: 2016-01-15 | Discharge: 2016-01-15 | Disposition: A | Discharge status: Home / Self Care | Payer: Medicare Other | Source: Ambulatory Visit | Attending: Unknown Physician Specialty | Admitting: Unknown Physician Specialty

## 2016-01-15 DIAGNOSIS — E039 Hypothyroidism, unspecified: Secondary | ICD-10-CM | POA: Diagnosis not present

## 2016-01-15 DIAGNOSIS — Z79899 Other long term (current) drug therapy: Secondary | ICD-10-CM | POA: Insufficient documentation

## 2016-01-15 DIAGNOSIS — I10 Essential (primary) hypertension: Secondary | ICD-10-CM | POA: Insufficient documentation

## 2016-01-15 DIAGNOSIS — Z9989 Dependence on other enabling machines and devices: Secondary | ICD-10-CM | POA: Diagnosis not present

## 2016-01-15 DIAGNOSIS — Z8719 Personal history of other diseases of the digestive system: Secondary | ICD-10-CM | POA: Diagnosis not present

## 2016-01-15 DIAGNOSIS — G4730 Sleep apnea, unspecified: Secondary | ICD-10-CM | POA: Insufficient documentation

## 2016-01-15 DIAGNOSIS — K219 Gastro-esophageal reflux disease without esophagitis: Secondary | ICD-10-CM | POA: Insufficient documentation

## 2016-01-15 DIAGNOSIS — Z7952 Long term (current) use of systemic steroids: Secondary | ICD-10-CM | POA: Insufficient documentation

## 2016-01-15 DIAGNOSIS — K5730 Diverticulosis of large intestine without perforation or abscess without bleeding: Secondary | ICD-10-CM | POA: Diagnosis not present

## 2016-01-15 DIAGNOSIS — Z09 Encounter for follow-up examination after completed treatment for conditions other than malignant neoplasm: Secondary | ICD-10-CM | POA: Diagnosis present

## 2016-01-15 DIAGNOSIS — K640 First degree hemorrhoids: Secondary | ICD-10-CM | POA: Insufficient documentation

## 2016-01-15 HISTORY — PX: COLONOSCOPY WITH PROPOFOL: SHX5780

## 2016-01-15 HISTORY — DX: Sleep apnea, unspecified: G47.30

## 2016-01-15 HISTORY — DX: Disorder of thyroid, unspecified: E07.9

## 2016-01-15 SURGERY — COLONOSCOPY WITH PROPOFOL
Anesthesia: General

## 2016-01-15 MED ORDER — SODIUM CHLORIDE 0.9 % IV SOLN
INTRAVENOUS | Status: DC
  Administered 2016-01-15: 11:00:00 via INTRAVENOUS
  Administered 2016-01-15: 1000 mL via INTRAVENOUS

## 2016-01-15 MED ORDER — FENTANYL CITRATE (PF) 100 MCG/2ML IJ SOLN
INTRAMUSCULAR | Status: DC | PRN
  Administered 2016-01-15: 50 ug via INTRAVENOUS

## 2016-01-15 MED ORDER — SODIUM CHLORIDE 0.9 % IV SOLN: INTRAVENOUS | Status: DC

## 2016-01-15 MED ORDER — PROPOFOL 10 MG/ML IV BOLUS
INTRAVENOUS | Status: DC | PRN
  Administered 2016-01-15: 100 mg via INTRAVENOUS

## 2016-01-15 MED ORDER — PROPOFOL 500 MG/50ML IV EMUL
INTRAVENOUS | Status: DC | PRN
Start: 2016-01-15 — End: 2016-01-15
  Administered 2016-01-15: 160 ug/kg/min via INTRAVENOUS

## 2016-01-15 MED ORDER — LIDOCAINE 2% (20 MG/ML) 5 ML SYRINGE
INTRAMUSCULAR | Status: DC | PRN
  Administered 2016-01-15: 40 mg via INTRAVENOUS

## 2016-01-15 MED ORDER — MIDAZOLAM HCL 5 MG/5ML IJ SOLN
INTRAMUSCULAR | Status: DC | PRN
  Administered 2016-01-15: 1 mg via INTRAVENOUS

## 2016-01-15 NOTE — Op Note (Signed)
[facility] Gastroenterology Patient Name: Kristi Barrera Procedure Date: 01/15/2016 10:32 AM MRN: [id number] Account #: [id number] Date of Birth: [birth year] Admit Type: Outpatient Age: 75 Room: [facility] ENDO ROOM 3 Gender: Female Note Status: Finalized Procedure:            Colonoscopy Indications:          follow up ischemic colitis Providers:            Scot Jun, MD Referring MD:         Duane Lope. Judithann Sheen, MD (Referring MD) Medicines:            Propofol per Anesthesia Complications:        No immediate complications. Procedure:            Pre-Anesthesia Assessment:                       - After reviewing the risks and benefits, the patient                        was deemed in satisfactory condition to undergo the                        procedure.                       After obtaining informed consent, the colonoscope was                        passed under direct vision. Throughout the procedure,                        the patient's blood pressure, pulse, and oxygen                        saturations were monitored continuously. The                        Colonoscope was introduced through the anus and                        advanced to the the cecum, identified by appendiceal                        orifice and ileocecal valve. The colonoscopy was                        performed without difficulty. The patient tolerated the                        procedure well. The quality of the bowel preparation                        was excellent. Findings:      Multiple small-medium mouthed diverticula were found in the sigmoid       colon, descending colon, transverse colon and ascending colon.      Internal hemorrhoids were found during endoscopy. The hemorrhoids were       small and Grade I (internal hemorrhoids that do not prolapse).      The exam was otherwise without abnormality. No sign of residual colitis. Impression:           -  Diverticulosis in the  sigmoid colon, in the                        descending colon, in the transverse colon and in the                        ascending colon.                       - Internal hemorrhoids.                       - The examination was otherwise normal.                       - No specimens collected. Recommendation:       - The findings and recommendations were discussed with                        the patient's family. Scot Jun, MD 01/15/2016 10:56:23 AM This report has been signed electronically. Number of Addenda: 0 Note Initiated On: 01/15/2016 10:32 AM Scope Withdrawal Time: 0 hours 6 minutes 29 seconds  Total Procedure Duration: 0 hours 13 minutes 9 seconds       [facility]

## 2016-01-15 NOTE — H&P (Signed)
Primary Care Physician:  Idelle Crouch, MD Primary Gastroenterologist:  Dr. Vira Agar  Pre-Procedure History & Physical: HPI:  Kristi Barrera is a 75 y.o. female is here for an colonoscopy.   Past Medical History  Diagnosis Date  . GERD (gastroesophageal reflux disease)   . Palpitations   . Hypothyroidism   . HTN (hypertension)   . Hyperlipidemia   . Chest pain   . Sinusitis   . Recurrent UTI   . Chronic low back pain   . Sarcoidosis ([place])     [place]---[facility] 15-81yrs ago  . Skin cancer   . Low blood potassium   . Vitamin D deficiency   . Thyroid disease   . Sleep apnea     cpap with 2 l o2    Past Surgical History  Procedure Laterality Date  . Breast cyst aspiration Left     neg  . Urinary procedure    . Colonoscopy with propofol N/A 08/06/2015    Procedure: COLONOSCOPY WITH PROPOFOL;  Surgeon: Manya Silvas, MD;  Location: [facility] ENDOSCOPY;  Service: Endoscopy;  Laterality: N/A;    Prior to Admission medications   Medication Sig Start Date End Date Taking? Authorizing Provider  amLODipine (NORVASC) 5 MG tablet Take 5 mg by mouth daily.   Yes Historical Provider, MD  ALPRAZolam (XANAX) 0.25 MG tablet Take 0.25 mg by mouth 3 (three) times daily.     Historical Provider, MD  esomeprazole (NEXIUM) 40 MG capsule Take 40 mg by mouth daily at 12 noon. Reported on 08/06/2015    Historical Provider, MD  fenofibrate micronized (LOFIBRA) 134 MG capsule Take 134 mg by mouth Daily. Reported on 08/02/2015 11/03/11   Historical Provider, MD  levothyroxine (SYNTHROID) 100 MCG tablet Take 100 mcg by mouth daily.    Historical Provider, MD  metroNIDAZOLE (FLAGYL) 500 MG tablet Take 1 tablet (500 mg total) by mouth 3 (three) times daily. 08/07/15   Demetrios Loll, MD  potassium chloride SA (K-DUR,KLOR-CON) 20 MEQ tablet Take 20 mEq by mouth daily. Reported on 08/02/2015 02/27/15 02/27/16  Historical Provider, MD  predniSONE (DELTASONE) 10 MG tablet Take 5 mg by mouth every other day. At  lunchtime    Historical Provider, MD  verapamil (VERELAN PM) 240 MG 24 hr capsule Take 240 mg by mouth at bedtime.    Historical Provider, MD    Allergies as of 01/09/2016 - Review Complete 08/07/2015  Allergen Reaction Noted  . Iohexol Shortness Of Breath 11/17/2011  . Clonidine derivatives  10/04/2013  . Floxin [ofloxacin] Other (See Comments) 03/03/2015  . Hydralazine hcl  11/17/2011  . Lotrel [amlodipine besy-benazepril hcl] Itching 11/16/2011  . Potassium-containing compounds Other (See Comments) 08/02/2015  . Sulfa antibiotics  11/16/2011  . Toprol xl [metoprolol succinate]  11/16/2011    Family History  Problem Relation Age of Onset  . Heart disease Father     MI  . Stroke Father   . Pneumonia Father   . Hypertension Father   . Stroke Mother   . Rheum arthritis Mother     Social History   Social History  . Marital Status: Widowed    Spouse Name: N/A  . Number of Children: N/A  . Years of Education: N/A   Occupational History  . retired    Social History Main Topics  . Smoking status: Never Smoker   . Smokeless tobacco: Never Used  . Alcohol Use: No  . Drug Use: No  . Sexual Activity: Not on file  Other Topics Concern  . Not on file   Social History Narrative    Review of Systems: See HPI, otherwise negative ROS  Physical Exam: BP 168/63 mmHg  Pulse 81  Temp(Src) 97.4 F (36.3 C) (Tympanic)  Resp 20  Ht 5\' 2"  (1.575 m)  Wt 67.586 kg (149 lb)  BMI 27.25 kg/m2  SpO2 100% General:   Alert,  pleasant and cooperative in NAD Head:  Normocephalic and atraumatic. Neck:  Supple; no masses or thyromegaly. Lungs:  Clear throughout to auscultation.    Heart:  Regular rate and rhythm. Abdomen:  Soft, nontender and nondistended. Normal bowel sounds, without guarding, and without rebound.   Neurologic:  Alert and  oriented x4;  grossly normal neurologically.  Impression/Plan: Kristi Barrera is here for an colonoscopy to be performed for follow up  ischemic colitis  Risks, benefits, limitations, and alternatives regarding  colonoscopy have been reviewed with the patient.  Questions have been answered.  All parties agreeable.   Gaylyn Cheers, MD  01/15/2016, 10:30 AM

## 2016-01-15 NOTE — Anesthesia Preprocedure Evaluation (Signed)
Anesthesia Evaluation  Patient identified by MRN, date of birth, ID band Patient awake    Reviewed: Allergy & Precautions, H&P , NPO status , Patient's Chart, lab work & pertinent test results, reviewed documented beta blocker date and time   Airway Mallampati: II   Neck ROM: full    Dental  (+) Poor Dentition, Teeth Intact   Pulmonary neg pulmonary ROS, sleep apnea and Continuous Positive Airway Pressure Ventilation ,  Use supplemental home o2 at night    Pulmonary exam normal        Cardiovascular hypertension, negative cardio ROS Normal cardiovascular exam Rhythm:regular Rate:Normal     Neuro/Psych negative neurological ROS  negative psych ROS   GI/Hepatic negative GI ROS, Neg liver ROS, GERD  Medicated,  Endo/Other  negative endocrine ROSHypothyroidism   Renal/GU negative Renal ROS  negative genitourinary   Musculoskeletal   Abdominal   Peds  Hematology negative hematology ROS (+)   Anesthesia Other Findings Past Medical History:   GERD (gastroesophageal reflux disease)                       Palpitations                                                 Hypothyroidism                                               HTN (hypertension)                                           Hyperlipidemia                                               Chest pain                                                   Sinusitis                                                    Recurrent UTI                                                Chronic low back pain                                        Sarcoidosis ([place])  Comment:[place]---[facility] 15-66yrs ago   Skin cancer                                                  Low blood potassium                                          Vitamin D deficiency                                         Thyroid disease                                               Sleep apnea                                                    Comment:cpap with 2 l o2 Past Surgical History:   BREAST CYST ASPIRATION                          Left                Comment:neg   Urinary procedure                                             COLONOSCOPY WITH PROPOFOL                       N/A 08/06/2015      Comment:Procedure: COLONOSCOPY WITH PROPOFOL;  Surgeon:              Manya Silvas, MD;  Location: [facility]               ENDOSCOPY;  Service: Endoscopy;  Laterality:               N/A; BMI    Body Mass Index   27.24 kg/m 2     Reproductive/Obstetrics                             Anesthesia Physical Anesthesia Plan  ASA: III  Anesthesia Plan: General   Post-op Pain Management:    Induction:   Airway Management Planned:   Additional Equipment:   Intra-op Plan:   Post-operative Plan:   Informed Consent: I have reviewed the patients History and Physical, chart, labs and discussed the procedure including the risks, benefits and alternatives for the proposed anesthesia with the patient or authorized representative who has indicated his/her understanding and acceptance.   Dental Advisory Given  Plan Discussed with: CRNA  Anesthesia Plan Comments:         Anesthesia Quick Evaluation

## 2016-01-15 NOTE — Transfer of Care (Signed)
Immediate Anesthesia Transfer of Care Note  Patient: Kristi Barrera  Procedure(s) Performed: Procedure(s): COLONOSCOPY WITH PROPOFOL (N/A)  Patient Location: PACU and Endoscopy Unit  Anesthesia Type:General  Level of Consciousness: awake, oriented and patient cooperative  Airway & Oxygen Therapy: Patient Spontanous Breathing and Patient connected to nasal cannula oxygen  Post-op Assessment: Report given to RN and Post -op Vital signs reviewed and stable  Post vital signs: Reviewed and stable  Last Vitals:  Filed Vitals:   01/15/16 0941  BP: 168/63  Pulse: 81  Temp: 36.3 C  Resp: 20    Last Pain: There were no vitals filed for this visit.       Complications: No apparent anesthesia complications

## 2016-01-16 NOTE — Anesthesia Postprocedure Evaluation (Signed)
Anesthesia Post Note  Patient: CHELCY LILLA  Procedure(s) Performed: Procedure(s) (LRB): COLONOSCOPY WITH PROPOFOL (N/A)  Patient location during evaluation: PACU Anesthesia Type: General Level of consciousness: awake and alert Pain management: pain level controlled Vital Signs Assessment: post-procedure vital signs reviewed and stable Respiratory status: spontaneous breathing, nonlabored ventilation, respiratory function stable and patient connected to nasal cannula oxygen Cardiovascular status: blood pressure returned to baseline and stable Postop Assessment: no signs of nausea or vomiting Anesthetic complications: no    Last Vitals:  Filed Vitals:   01/15/16 1058 01/15/16 1059  BP: 120/58 120/58  Pulse: 80 80  Temp: 36.3 C 36.8 C  Resp: 18 14    Last Pain:  Filed Vitals:   01/16/16 0746  PainSc: 0-No pain                 Molli Barrows

## 2016-01-17 ENCOUNTER — Encounter: Payer: Self-pay | Admitting: Unknown Physician Specialty

## 2016-02-10 ENCOUNTER — Ambulatory Visit: Payer: Medicare Other

## 2016-02-14 ENCOUNTER — Ambulatory Visit
Admission: RE | Admit: 2016-02-14 | Discharge: 2016-02-14 | Disposition: A | Discharge status: Home / Self Care | Payer: Medicare Other | Source: Ambulatory Visit | Attending: Internal Medicine | Admitting: Internal Medicine

## 2016-02-14 ENCOUNTER — Other Ambulatory Visit: Payer: Self-pay | Admitting: Internal Medicine

## 2016-02-14 DIAGNOSIS — Z1231 Encounter for screening mammogram for malignant neoplasm of breast: Secondary | ICD-10-CM | POA: Insufficient documentation

## 2016-04-09 ENCOUNTER — Telehealth: Payer: Self-pay

## 2016-04-09 DIAGNOSIS — G4733 Obstructive sleep apnea (adult) (pediatric): Secondary | ICD-10-CM

## 2016-04-09 NOTE — Telephone Encounter (Signed)
Per 07/24/14 OV:  Return in about 6 months (around 01/22/2015).  Keep using your CPAP machine with oxygen at night.  We will see you back in 6 months or sooner if needed  --  Called spoke with pt. She is needing CPAP supplies sent to [facility] She reports she has been unable to f/u d/t having colitis. Please advise Dr. Lake Bells thanks

## 2016-04-11 NOTE — Telephone Encounter (Signed)
OK to refill but she needs to schedule an appointment

## 2016-04-13 NOTE — Telephone Encounter (Signed)
Spoke with pt. She is aware that BQ will write order for supplies. ROV has been scheduled for 04/28/16 at 4pm. Nothing further was needed.

## 2016-04-28 ENCOUNTER — Ambulatory Visit (INDEPENDENT_AMBULATORY_CARE_PROVIDER_SITE_OTHER): Payer: Medicare Other | Admitting: Pulmonary Disease

## 2016-04-28 ENCOUNTER — Encounter: Payer: Self-pay | Admitting: Pulmonary Disease

## 2016-04-28 DIAGNOSIS — I2721 Secondary pulmonary arterial hypertension: Secondary | ICD-10-CM

## 2016-04-28 DIAGNOSIS — R0600 Dyspnea, unspecified: Secondary | ICD-10-CM

## 2016-04-28 DIAGNOSIS — G4733 Obstructive sleep apnea (adult) (pediatric): Secondary | ICD-10-CM | POA: Diagnosis not present

## 2016-04-28 DIAGNOSIS — D869 Sarcoidosis, unspecified: Secondary | ICD-10-CM | POA: Diagnosis not present

## 2016-04-28 NOTE — Addendum Note (Signed)
Addended by: Len Blalock on: 04/28/2016 04:37 PM   Modules accepted: Orders

## 2016-04-28 NOTE — Assessment & Plan Note (Signed)
She remains compliant with CPAP therapy. We will request a download for compliance report.

## 2016-04-28 NOTE — Assessment & Plan Note (Signed)
She has pulmonary hypertension seen on echocardiogram without clear evidence of progression and no evidence that it has caused any sort of symptoms.  Her 6 minute walk distance has been stable over several years and I still see no indication for treatment at this time.  Plan: Repeat 6 walk today Follow-up one year

## 2016-04-28 NOTE — Assessment & Plan Note (Signed)
This was diagnosed in the 12s but has not progressed since. I see no indication for further workup at this time

## 2016-04-28 NOTE — Progress Notes (Signed)
Patient ID: Kristi Barrera, female    DOB: Nov 05, 1940   MRN: [id number]  Synopsis: Kristi Barrera was first referred to the [facility] office for pulmonary hypertension April 2013. She has a past medical history significant for sarcoidosis diagnosed by Dr. Guido Sander at [facility] in the 1980s. She has never required treatment for this. In 2013 she was found to have a PA pressure of 51 on a transthoracic echocardiogram in the setting of no symptoms and was referred to [facility] pulmonary for further evaluation. A CT scan performed in may of 2013 showed a small match defect in the right lower lobe. An overnight sleep oximetry study on room air in April 2013 showed oxygen desaturation below 88% for 11 minutes and she was given 2 L of oxygen for nocturnal use.    HPI   Subjective:   Chief Complaint  Patient presents with  . Follow-up    pt scheduled OV per insurance to get cpap supplies.  Pt denies any breathing complaints at this time.     [name] has been doing OK in terms of her breathing.  She was hospitalized in January 2017 for bleeding in her colon and she was told she had multiple polyps and ulcers.  The bleeding has stopped.  She had a follow up colonoscopy in June which looked OK.  During taht time no trouble breathing, no cough.  She is remaining active.  She is getting married next month.  She has been engaged for a year.    She is still using her CPAP machine regularly.  She uses it every night.  She still uses oxygen at night.    Past Medical History:  Diagnosis Date  . Chest pain   . Chronic low back pain   . GERD (gastroesophageal reflux disease)   . HTN (hypertension)   . Hyperlipidemia   . Hypothyroidism   . Low blood potassium   . Palpitations   . Recurrent UTI   . Sarcoidosis ([facility])    [place]---[facility] 15-61yrs ago  . Sinusitis   . Skin cancer   . Sleep apnea    cpap with 2 l o2  . Thyroid disease   . Vitamin D deficiency         Review of Systems   Constitutional: Negative for diaphoresis, fatigue and fever.  HENT: Negative for congestion, nosebleeds, postnasal drip and rhinorrhea.   Respiratory: Negative for cough, chest tightness, shortness of breath and stridor.   Cardiovascular: Negative for chest pain and leg swelling.     Physical Exam  Vitals:   04/28/16 1559  BP: (!) 142/68  BP Location: Left Arm  Cuff Size: Normal  Pulse: 73  SpO2: 97%  Weight: 150 lb (68 kg)  Height: 5\' 2"  (1.575 m)  RA  Gen: well appearing, no acute distress HEENT: NCAT, EOMi, OP clear,  PULM: Again, Few crackles RLL, otherwise clear CV: RRR, systolic murmur RUSB, no JVD AB: BS+, soft, nontender, no hsm Ext: warm, no edema, no clubbing, no cyanosis  -01/2013 6 minute walk distance is 304 m, did not desaturate less than 93% on room air - 6 MW 10/04/2013 > 405 m O2 saturation no lower than 98% on RA -6 MW January 2016> 409 m, O2 saturation no lower than 99% on room air.  Records from June 2017 when she had a colonoscopy by Dr. Mechele Collin reviewed.  Assessment & Plan:   PAH (pulmonary artery hypertension) She has pulmonary hypertension seen on echocardiogram without clear evidence  of progression and no evidence that it has caused any sort of symptoms.  Her 6 minute walk distance has been stable over several years and I still see no indication for treatment at this time.  Plan: Repeat 6 walk today Follow-up one year  Sarcoidosis ([facility]) This was diagnosed in the 1980s but has not progressed since. I see no indication for further workup at this time  OSA (obstructive sleep apnea) She remains compliant with CPAP therapy. We will request a download for compliance report.   Updated Medication List Outpatient Encounter Prescriptions as of 04/28/2016  Medication Sig Dispense Refill  . ALPRAZolam (XANAX) 0.25 MG tablet Take 0.25 mg by mouth 3 (three) times daily.     Marland Kitchen amLODipine (NORVASC) 5 MG tablet Take 5 mg by mouth daily.    Marland Kitchen esomeprazole  (NEXIUM) 40 MG capsule Take 40 mg by mouth daily at 12 noon. Reported on 08/06/2015    . fenofibrate micronized (LOFIBRA) 134 MG capsule Take 134 mg by mouth Daily. Reported on 08/02/2015    . levothyroxine (SYNTHROID) 100 MCG tablet Take 100 mcg by mouth daily.    . verapamil (VERELAN PM) 240 MG 24 hr capsule Take 240 mg by mouth at bedtime.    . [DISCONTINUED] metroNIDAZOLE (FLAGYL) 500 MG tablet Take 1 tablet (500 mg total) by mouth 3 (three) times daily. (Patient not taking: Reported on 04/28/2016) 15 tablet 0  . [DISCONTINUED] potassium chloride SA (K-DUR,KLOR-CON) 20 MEQ tablet Take 20 mEq by mouth daily. Reported on 08/02/2015    . [DISCONTINUED] predniSONE (DELTASONE) 10 MG tablet Take 5 mg by mouth every other day. At lunchtime     No facility-administered encounter medications on file as of 04/28/2016.

## 2016-04-28 NOTE — Patient Instructions (Signed)
We will request a download of compliance report from your CPAP machine We will see you back in one year or sooner if needed Get a flu shot after your wedding

## 2016-05-22 ENCOUNTER — Encounter: Payer: Self-pay | Admitting: Pulmonary Disease

## 2016-08-18 ENCOUNTER — Telehealth: Payer: Self-pay | Admitting: Pulmonary Disease

## 2016-08-18 NOTE — Telephone Encounter (Signed)
BQ  Please Advise-   Pt. Called in today and stated she has been looking into purchasing her own portable o2 tank instead of renting. She says the cost of her renting one if she needs to go some where out of town in $100 and she thinks It would be best she just buy one herself. She says she has been looking at doing it through [employer]. She wanted to know your opinion of this and would you be willing to write this rx.

## 2016-08-18 NOTE — Telephone Encounter (Signed)
I like the inogen device.  I'm OK with her buying her own.

## 2016-08-18 NOTE — Telephone Encounter (Signed)
[email or web address] x1 

## 2016-08-19 NOTE — Telephone Encounter (Signed)
Spoke with the pt  She states she wants the rx for this mailed to her  I have done so after verifying her address and placed copy in BQ's scan  Nothing further needed

## 2016-08-19 NOTE — Telephone Encounter (Signed)
Pt returning call from yesterday she can also be reached @ [phone] pt says she will need a prescript for the device.Kristi Barrera

## 2017-02-08 ENCOUNTER — Other Ambulatory Visit: Payer: Self-pay | Admitting: Internal Medicine

## 2017-02-08 DIAGNOSIS — Z1231 Encounter for screening mammogram for malignant neoplasm of breast: Secondary | ICD-10-CM

## 2017-02-24 ENCOUNTER — Ambulatory Visit
Admission: RE | Admit: 2017-02-24 | Discharge: 2017-02-24 | Disposition: A | Discharge status: Home / Self Care | Payer: Medicare Other | Source: Ambulatory Visit | Attending: Internal Medicine | Admitting: Internal Medicine

## 2017-02-24 DIAGNOSIS — Z1231 Encounter for screening mammogram for malignant neoplasm of breast: Secondary | ICD-10-CM | POA: Diagnosis present

## 2017-03-30 ENCOUNTER — Encounter: Payer: Self-pay | Admitting: *Deleted

## 2017-04-06 ENCOUNTER — Ambulatory Visit
Admission: RE | Admit: 2017-04-06 | Discharge: 2017-04-06 | Disposition: A | Discharge status: Home / Self Care | Payer: Medicare Other | Source: Ambulatory Visit | Attending: Ophthalmology | Admitting: Ophthalmology

## 2017-04-06 ENCOUNTER — Ambulatory Visit: Payer: Medicare Other | Admitting: Certified Registered Nurse Anesthetist

## 2017-04-06 ENCOUNTER — Encounter
Admission: RE | Disposition: A | Discharge status: Home / Self Care | Payer: Self-pay | Source: Ambulatory Visit | Attending: Ophthalmology

## 2017-04-06 DIAGNOSIS — I499 Cardiac arrhythmia, unspecified: Secondary | ICD-10-CM | POA: Diagnosis not present

## 2017-04-06 DIAGNOSIS — I10 Essential (primary) hypertension: Secondary | ICD-10-CM | POA: Insufficient documentation

## 2017-04-06 DIAGNOSIS — M1990 Unspecified osteoarthritis, unspecified site: Secondary | ICD-10-CM | POA: Diagnosis not present

## 2017-04-06 DIAGNOSIS — Z85828 Personal history of other malignant neoplasm of skin: Secondary | ICD-10-CM | POA: Diagnosis not present

## 2017-04-06 DIAGNOSIS — G4730 Sleep apnea, unspecified: Secondary | ICD-10-CM | POA: Diagnosis not present

## 2017-04-06 DIAGNOSIS — F419 Anxiety disorder, unspecified: Secondary | ICD-10-CM | POA: Insufficient documentation

## 2017-04-06 DIAGNOSIS — E079 Disorder of thyroid, unspecified: Secondary | ICD-10-CM | POA: Diagnosis not present

## 2017-04-06 DIAGNOSIS — K219 Gastro-esophageal reflux disease without esophagitis: Secondary | ICD-10-CM | POA: Diagnosis not present

## 2017-04-06 DIAGNOSIS — E559 Vitamin D deficiency, unspecified: Secondary | ICD-10-CM | POA: Insufficient documentation

## 2017-04-06 DIAGNOSIS — H2511 Age-related nuclear cataract, right eye: Secondary | ICD-10-CM | POA: Insufficient documentation

## 2017-04-06 DIAGNOSIS — E7800 Pure hypercholesterolemia, unspecified: Secondary | ICD-10-CM | POA: Insufficient documentation

## 2017-04-06 DIAGNOSIS — K5190 Ulcerative colitis, unspecified, without complications: Secondary | ICD-10-CM | POA: Insufficient documentation

## 2017-04-06 HISTORY — DX: Anxiety disorder, unspecified: F41.9

## 2017-04-06 HISTORY — DX: Ulcerative colitis, unspecified, without complications: K51.90

## 2017-04-06 HISTORY — PX: CATARACT EXTRACTION W/PHACO: SHX586

## 2017-04-06 HISTORY — DX: Cardiac arrhythmia, unspecified: I49.9

## 2017-04-06 HISTORY — DX: Other specified congenital malformations of heart: Q24.8

## 2017-04-06 HISTORY — DX: Unspecified osteoarthritis, unspecified site: M19.90

## 2017-04-06 SURGERY — PHACOEMULSIFICATION, CATARACT, WITH IOL INSERTION
Anesthesia: Monitor Anesthesia Care | Site: Eye | Laterality: Right | Wound class: Clean

## 2017-04-06 MED ORDER — POVIDONE-IODINE 5 % OP SOLN
OPHTHALMIC | Status: DC | PRN
  Administered 2017-04-06: 1 via OPHTHALMIC

## 2017-04-06 MED ORDER — ARMC OPHTHALMIC DILATING DROPS
OPHTHALMIC | Status: AC
  Administered 2017-04-06: 1 via OPHTHALMIC
  Filled 2017-04-06: qty 0.4

## 2017-04-06 MED ORDER — LIDOCAINE HCL (PF) 4 % IJ SOLN
INTRAMUSCULAR | Status: AC
  Filled 2017-04-06: qty 5

## 2017-04-06 MED ORDER — SODIUM CHLORIDE 0.9 % IV SOLN
INTRAVENOUS | Status: DC
  Administered 2017-04-06: 09:00:00 via INTRAVENOUS

## 2017-04-06 MED ORDER — FENTANYL CITRATE (PF) 100 MCG/2ML IJ SOLN
INTRAMUSCULAR | Status: DC | PRN
  Administered 2017-04-06: 25 ug via INTRAVENOUS

## 2017-04-06 MED ORDER — ARMC OPHTHALMIC DILATING DROPS
1.0000 "application " | OPHTHALMIC | Status: AC
  Administered 2017-04-06 (×3): 1 via OPHTHALMIC

## 2017-04-06 MED ORDER — FENTANYL CITRATE (PF) 100 MCG/2ML IJ SOLN
INTRAMUSCULAR | Status: AC
  Filled 2017-04-06: qty 2

## 2017-04-06 MED ORDER — CARBACHOL 0.01 % IO SOLN
INTRAOCULAR | Status: DC | PRN
  Administered 2017-04-06: 0.5 mL via INTRAOCULAR

## 2017-04-06 MED ORDER — NA CHONDROIT SULF-NA HYALURON 40-17 MG/ML IO SOLN
INTRAOCULAR | Status: AC
  Filled 2017-04-06: qty 1

## 2017-04-06 MED ORDER — POLYMYXIN B-TRIMETHOPRIM 10000-0.1 UNIT/ML-% OP SOLN: 1.0000 [drp] | OPHTHALMIC | Status: DC | PRN

## 2017-04-06 MED ORDER — EPINEPHRINE PF 1 MG/ML IJ SOLN
INTRAOCULAR | Status: DC | PRN
  Administered 2017-04-06: 10:00:00 via OPHTHALMIC

## 2017-04-06 MED ORDER — MOXIFLOXACIN HCL 0.5 % OP SOLN
OPHTHALMIC | Status: DC | PRN
  Administered 2017-04-06: 0.2 mL via OPHTHALMIC

## 2017-04-06 MED ORDER — POVIDONE-IODINE 5 % OP SOLN
OPHTHALMIC | Status: AC
  Filled 2017-04-06: qty 30

## 2017-04-06 MED ORDER — EPINEPHRINE PF 1 MG/ML IJ SOLN
INTRAMUSCULAR | Status: AC
  Filled 2017-04-06: qty 1

## 2017-04-06 MED ORDER — POLYMYXIN B-TRIMETHOPRIM 10000-0.1 UNIT/ML-% OP SOLN: OPHTHALMIC | Status: DC | PRN

## 2017-04-06 MED ORDER — MIDAZOLAM HCL 2 MG/2ML IJ SOLN
INTRAMUSCULAR | Status: DC | PRN
  Administered 2017-04-06: 0.5 mg via INTRAVENOUS

## 2017-04-06 MED ORDER — CEFUROXIME OPHTHALMIC INJECTION 1 MG/0.1 ML
INJECTION | OPHTHALMIC | Status: DC | PRN
  Administered 2017-04-06: 1 mg via INTRACAMERAL

## 2017-04-06 MED ORDER — LIDOCAINE HCL (PF) 4 % IJ SOLN
INTRAMUSCULAR | Status: DC | PRN
  Administered 2017-04-06: 4 mL via OPHTHALMIC

## 2017-04-06 MED ORDER — NA CHONDROIT SULF-NA HYALURON 40-17 MG/ML IO SOLN
INTRAOCULAR | Status: DC | PRN
  Administered 2017-04-06: 1 mL via INTRAOCULAR

## 2017-04-06 MED ORDER — MOXIFLOXACIN HCL 0.5 % OP SOLN
OPHTHALMIC | Status: AC
  Filled 2017-04-06: qty 3

## 2017-04-06 MED ORDER — MIDAZOLAM HCL 2 MG/2ML IJ SOLN
INTRAMUSCULAR | Status: AC
  Filled 2017-04-06: qty 2

## 2017-04-06 MED ORDER — POLYMYXIN B-TRIMETHOPRIM 10000-0.1 UNIT/ML-% OP SOLN
OPHTHALMIC | Status: AC
  Filled 2017-04-06: qty 10

## 2017-04-06 SURGICAL SUPPLY — 16 items
GLOVE BIO SURGEON STRL SZ8 (GLOVE) ×3 IMPLANT
GLOVE BIOGEL M 6.5 STRL (GLOVE) ×3 IMPLANT
GLOVE SURG LX 8.0 MICRO (GLOVE) ×2
GLOVE SURG LX STRL 8.0 MICRO (GLOVE) ×1 IMPLANT
GOWN STRL REUS W/ TWL LRG LVL3 (GOWN DISPOSABLE) ×2 IMPLANT
GOWN STRL REUS W/TWL LRG LVL3 (GOWN DISPOSABLE) ×4
LABEL CATARACT MEDS ST (LABEL) ×3 IMPLANT
LENS IOL TECNIS ITEC 23.5 (Intraocular Lens) ×3 IMPLANT
PACK CATARACT (MISCELLANEOUS) ×3 IMPLANT
PACK CATARACT BRASINGTON LX (MISCELLANEOUS) ×3 IMPLANT
PACK EYE AFTER SURG (MISCELLANEOUS) ×3 IMPLANT
SOL BSS BAG (MISCELLANEOUS) ×3
SOLUTION BSS BAG (MISCELLANEOUS) ×1 IMPLANT
SYR 5ML LL (SYRINGE) ×3 IMPLANT
WATER STERILE IRR 250ML POUR (IV SOLUTION) ×3 IMPLANT
WIPE NON LINTING 3.25X3.25 (MISCELLANEOUS) ×3 IMPLANT

## 2017-04-06 NOTE — H&P (Signed)
All labs reviewed. Abnormal studies sent to patients PCP when indicated.  Previous H&P reviewed, patient examined, there are NO CHANGES.  Kristi Barrera LOUIS9/18/20189:47 AM

## 2017-04-06 NOTE — Discharge Instructions (Signed)
FOLLOW DR. PORFILIO'S POSTOP EYE DROP INSTRUCTION SHEET AS REVIEWED.  Eye Surgery Discharge Instructions  Expect mild scratchy sensation or mild soreness. DO NOT RUB YOUR EYE!  The day of surgery:  Minimal physical activity, but bed rest is not required  No reading, computer work, or close hand work  No bending, lifting, or straining.  May watch TV  For 24 hours:  No driving, legal decisions, or alcoholic beverages  Safety precautions  Eat anything you prefer: It is better to start with liquids, then soup then solid foods.  _____ Eye patch should be worn until postoperative exam tomorrow.  ____ Solar shield eyeglasses should be worn for comfort in the sunlight/patch while sleeping  Resume all regular medications including aspirin or Coumadin if these were discontinued prior to surgery. You may shower, bathe, shave, or wash your hair. Tylenol may be taken for mild discomfort.  Call your doctor if you experience significant pain, nausea, or vomiting, fever > 101 or other signs of infection. [phone] or [phone] Specific instructions:  Follow-up Information    Birder Robson, MD Follow up.   Specialty:  Ophthalmology Why:  TODAY 04-06-17 @ 2:15 pm Contact information: [address] [place] [address] [phone]

## 2017-04-06 NOTE — Transfer of Care (Signed)
Immediate Anesthesia Transfer of Care Note  Patient: Kristi Barrera  Procedure(s) Performed: Procedure(s) with comments: CATARACT EXTRACTION PHACO AND INTRAOCULAR LENS PLACEMENT (IOC) (Right) - [place] 00:36.1 AP% 18.5 CDE 6.66 fluid Pack lot # [id number]  Patient Location: PACU  Anesthesia Type:MAC  Level of Consciousness: awake, alert  and oriented  Airway & Oxygen Therapy: Patient Spontanous Breathing  Post-op Assessment: Report given to RN and Post -op Vital signs reviewed and stable  Post vital signs: Reviewed and stable  Last Vitals:  Vitals:   04/06/17 0822 04/06/17 1010  BP: (!) 161/57 (!) 152/55  Pulse: 69 69  Resp: 17 16  Temp: 36.9 C   SpO2: 98% 100%    Last Pain:  Vitals:   04/06/17 1010  TempSrc: Oral         Complications: No apparent anesthesia complications

## 2017-04-06 NOTE — Anesthesia Preprocedure Evaluation (Signed)
Anesthesia Evaluation  Patient identified by MRN, date of birth, ID band Patient awake    Reviewed: Allergy & Precautions, H&P , NPO status , Patient's Chart, lab work & pertinent test results, reviewed documented beta blocker date and time   Airway Mallampati: II  TM Distance: >3 FB Neck ROM: full    Dental no notable dental hx. (+) Teeth Intact   Pulmonary neg pulmonary ROS, neg shortness of breath, sleep apnea, Continuous Positive Airway Pressure Ventilation and Oxygen sleep apnea ,    Pulmonary exam normal breath sounds clear to auscultation       Cardiovascular Exercise Tolerance: Good hypertension, Pt. on medications negative cardio ROS  (-) dysrhythmias + Valvular Problems/Murmurs  Rhythm:regular Rate:Normal     Neuro/Psych negative neurological ROS  negative psych ROS   GI/Hepatic negative GI ROS, Neg liver ROS, PUD, GERD  Medicated,  Endo/Other  negative endocrine ROSdiabetesHypothyroidism   Renal/GU      Musculoskeletal   Abdominal   Peds  Hematology negative hematology ROS (+)   Anesthesia Other Findings   Reproductive/Obstetrics negative OB ROS                             Anesthesia Physical Anesthesia Plan  ASA: III  Anesthesia Plan: MAC   Post-op Pain Management:    Induction:   PONV Risk Score and Plan:   Airway Management Planned:   Additional Equipment:   Intra-op Plan:   Post-operative Plan:   Informed Consent: I have reviewed the patients History and Physical, chart, labs and discussed the procedure including the risks, benefits and alternatives for the proposed anesthesia with the patient or authorized representative who has indicated his/her understanding and acceptance.     Plan Discussed with: CRNA  Anesthesia Plan Comments:         Anesthesia Quick Evaluation

## 2017-04-06 NOTE — Op Note (Signed)
PREOPERATIVE DIAGNOSIS:  Nuclear sclerotic cataract of the left eye.   POSTOPERATIVE DIAGNOSIS:  NUCLEAR SCLEROTIC CATARACT RIGHT EYE   OPERATIVE PROCEDURE:  Procedure(s): CATARACT EXTRACTION PHACO AND INTRAOCULAR LENS PLACEMENT (IOC)   SURGEON:  Birder Robson, MD.   ANESTHESIA:   Anesthesiologist: Molli Barrows, MD CRNA: Darlyne Russian, CRNA  1.      Managed anesthesia care. 2.      Topical tetracaine drops followed by 2% Xylocaine jelly applied in the preoperative holding area.   COMPLICATIONS:  None.   TECHNIQUE:   Stop and chop   DESCRIPTION OF PROCEDURE:  The patient was examined and consented in the preoperative holding area where the aforementioned topical anesthesia was applied to the left eye and then brought back to the Operating Room where the left eye was prepped and draped in the usual sterile ophthalmic fashion and a lid speculum was placed. A paracentesis was created with the side port blade and the anterior chamber was filled with viscoelastic. A near clear corneal incision was performed with the steel keratome. A continuous curvilinear capsulorrhexis was performed with a cystotome followed by the capsulorrhexis forceps. Hydrodissection and hydrodelineation were carried out with BSS on a blunt cannula. The lens was removed in a stop and chop  technique and the remaining cortical material was removed with the irrigation-aspiration handpiece. The capsular bag was inflated with viscoelastic and the Technis [id number] lens was placed in the capsular bag without complication. The remaining viscoelastic was removed from the eye with the irrigation-aspiration handpiece. The wounds were hydrated. The anterior chamber was flushed with Miostat and the eye was inflated to physiologic pressure. 0.1 mL of cefuroxime concentration 10 mg/mL was placed in the anterior chamber. The wounds were found to be water tight. The eye was dressed with Vigamox. The patient was given protective glasses to  wear throughout the day and a shield with which to sleep tonight. The patient was also given drops with which to begin a drop regimen today and will follow-up with me in one day.  Implant Name Type Inv. Item Serial No. Manufacturer Lot No. LRB No. Used  LENS IOL DIOP 23.5 - [id number] Intraocular Lens LENS IOL DIOP 23.5 [id number]   Right 1   Procedure(s) with comments: CATARACT EXTRACTION PHACO AND INTRAOCULAR LENS PLACEMENT (IOC) (Right) - [place] 00:36.1 AP% 18.5 CDE 6.66 fluid Pack lot # [id number]  Electronically signed: [place] 04/06/2017 10:09 AM

## 2017-04-06 NOTE — Anesthesia Procedure Notes (Signed)
Procedure Name: MAC Date/Time: 04/06/2017 9:52 AM Performed by: Darlyne Russian Pre-anesthesia Checklist: Patient identified, Emergency Drugs available, Suction available, Patient being monitored and Timeout performed Oxygen Delivery Method: Nasal cannula Placement Confirmation: positive ETCO2

## 2017-04-06 NOTE — Anesthesia Post-op Follow-up Note (Signed)
Anesthesia QCDR form completed.        

## 2017-04-06 NOTE — Anesthesia Postprocedure Evaluation (Signed)
Anesthesia Post Note  Patient: Kristi Barrera  Procedure(s) Performed: Procedure(s) (LRB): CATARACT EXTRACTION PHACO AND INTRAOCULAR LENS PLACEMENT (IOC) (Right)  Patient location during evaluation: PACU Anesthesia Type: MAC Level of consciousness: awake and alert Pain management: pain level controlled Vital Signs Assessment: post-procedure vital signs reviewed and stable Respiratory status: spontaneous breathing, nonlabored ventilation, respiratory function stable and patient connected to nasal cannula oxygen Cardiovascular status: stable and blood pressure returned to baseline Postop Assessment: no apparent nausea or vomiting Anesthetic complications: no     Last Vitals:  Vitals:   04/06/17 0822 04/06/17 1010  BP: (!) 161/57 (!) 152/55  Pulse: 69 67  Resp: 17 16  Temp: 36.9 C 36.9 C  SpO2: 98% 100%    Last Pain:  Vitals:   04/06/17 1010  TempSrc: Oral                 Darlyne Russian

## 2017-05-06 ENCOUNTER — Ambulatory Visit (INDEPENDENT_AMBULATORY_CARE_PROVIDER_SITE_OTHER): Payer: Medicare Other | Admitting: Pulmonary Disease

## 2017-05-06 ENCOUNTER — Encounter: Payer: Self-pay | Admitting: Pulmonary Disease

## 2017-05-06 ENCOUNTER — Ambulatory Visit (INDEPENDENT_AMBULATORY_CARE_PROVIDER_SITE_OTHER): Payer: Medicare Other | Admitting: *Deleted

## 2017-05-06 VITALS — BP 138/76 | HR 73 | Ht 62.0 in | Wt 152.0 lb

## 2017-05-06 DIAGNOSIS — G4733 Obstructive sleep apnea (adult) (pediatric): Secondary | ICD-10-CM | POA: Diagnosis not present

## 2017-05-06 DIAGNOSIS — D869 Sarcoidosis, unspecified: Secondary | ICD-10-CM | POA: Diagnosis not present

## 2017-05-06 DIAGNOSIS — I2721 Secondary pulmonary arterial hypertension: Secondary | ICD-10-CM

## 2017-05-06 NOTE — Progress Notes (Signed)
SIX MIN WALK 05/06/2017 04/28/2016 07/24/2014 10/04/2013 12/31/2011  Medications Amlodopine 11:00am, Levothyroxine 1102mcg 9:30am, Olmesartan 40mg  2:30pm Amlodipine, Levothyroxine amlodipine 5mg , prednisone 5mg , synthroid 100 mcg taken approx 8:00 a.m.  - none  Supplimental Oxygen during Test? (L/min) No No No No No  Laps 8 8 8 13 7   Partial Lap (in Meters) 6 12 25 30 27   Baseline BP (sitting) 140/64 138/80 126/65 146/72 124/76  Baseline Heartrate 70 74 81 78 67  Baseline Dyspnea (Borg Scale) 0 0 0 - 0  Baseline Fatigue (Borg Scale) 0 0 0 - 3  Baseline SPO2 98 98 98 100 99  BP (sitting) 158/70 160/86 148/89 168/78 142/78  Heartrate 107 100 122 115 87  Dyspnea (Borg Scale) 1 0 3 - 1  Fatigue (Borg Scale) 1 1 3  - 2  SPO2 98 98 96 98 99  BP (sitting) 150/64 142/84 134/76 156/70 126/74  Heartrate 82 84 105 88 71  SPO2 100 98 99 100 98  Stopped or Paused before Six Minutes No No No No No  Interpretation - - Leg pain - -  Distance Completed [phone]  Tech Comments: TA/CMA - Pt tolerated walk well- did slow down from a moderately fast pace to a moderate pace after 3:00 due to her knee pain.  She states she has chronic knee pain and this is normal for her with exertion.  She did not wish to pause or stop the walk at that time.   - Test performed by June Leap. Pt completed 6 minute walk without any pauses or complaints.

## 2017-05-06 NOTE — Progress Notes (Signed)
Patient ID: Kristi Barrera, female    DOB: [birth year]   MRN: [id number]  Synopsis: Kristi Barrera was first referred to the [facility] office for pulmonary hypertension April 2013. She has a past medical history significant for sarcoidosis diagnosed by Dr. Guido Sander at [facility] in the 1980s. She has never required treatment for this. In 2013 she was found to have a PA pressure of 51 on a transthoracic echocardiogram in the setting of no symptoms and was referred to [facility] pulmonary for further evaluation. A CT scan performed in may of 2013 showed a small match defect in the right lower lobe. An overnight sleep oximetry study on room air in April 2013 showed oxygen desaturation below 88% for 11 minutes and she was given 2 L of oxygen for nocturnal use.    HPI   Subjective:   Chief Complaint  Patient presents with  . Follow-up    pt states she is doing well, denies any current breathing complaints.     Kristi Barrera says that her knees have been a little better.  She has been exercising more lately which has helped.  She says that she walks regularly.  She reports no difficulty with her   CPAP with 2L O2 nightly.  She is still using it regularly.  She feels well rested when she wakes up in the morning.    Past Medical History:  Diagnosis Date  . Abnormal cardiac valve    LEAKING HEART VALVE  . Anxiety   . Arthritis   . Chest pain   . Chronic low back pain   . Colitis, chronic, ulcerative (HCC)   . Dysrhythmia   . GERD (gastroesophageal reflux disease)   . HTN (hypertension)   . Hyperlipidemia   . Hypothyroidism   . Low blood potassium   . Palpitations   . Recurrent UTI   . Sarcoidosis    [place]---[facility] 15-53yrs ago  . Sinusitis   . Skin cancer   . Sleep apnea    cpap with 2 l o2  . Thyroid disease   . Vitamin D deficiency         Review of Systems  Constitutional: Negative for diaphoresis, fatigue and fever.  HENT: Negative for congestion, nosebleeds, postnasal drip  and rhinorrhea.   Respiratory: Negative for cough, chest tightness, shortness of breath and stridor.   Cardiovascular: Negative for chest pain and leg swelling.     Physical Exam  Vitals:   05/06/17 1448  BP: 138/76  Pulse: 73  SpO2: 98%  Weight: 152 lb (68.9 kg)  Height: 5\' 2"  (1.575 m)  RA  Gen: well appearing HENT: OP clear, TM's clear, neck supple PULM: Crackles on R B, normal percussion CV: RRR, JVD noted today, no mgr, trace edema GI: BS+, soft, nontender Derm: no cyanosis or rash Psyche: normal mood and affect   Echo: 02/2012 TTE> RVSP 65 2018 [facility] TTE> , normal RV function, LVEF normal  -01/2013 6 minute walk distance is 304 m, did not desaturate less than 93% on room air - 6 MW 10/04/2013 > 405 m O2 saturation no lower than 98% on RA -6 MW January 2016> 409 m, O2 saturation no lower than 99% on room air.  Records from June 2017 when she had a colonoscopy by Dr. Mechele Collin reviewed.  Assessment & Plan:   PAH (pulmonary artery hypertension) (HCC)  Sarcoidosis  OSA (obstructive sleep apnea)  Discussion: This has been a stable interval for her without worsening since the last visit.  We will get a 6 minute walk to evaluate whether or not the pulmonary hypertension has worsened. I reviewed the echocardiogram from [facility] from just a few weeks ago which actually showed improved RV estimated pressures compared to the one performed by [place] in 2013. She does have sarcoidosis but this has never progressed.  Plan: Pulmonary hypertension: We will check a 6 minute walk I'm glad that your recent echocardiogram was actually improved compared to last one we have performed in 2013  Obstructive sleep apnea: Keep using CPAP with 2 L of oxygen every night  Sarcoidosis: There is no reason for residue anything different right now I'm glad you got a flu shot this year  I will see you back in one year or sooner if needed    Current Outpatient Prescriptions:  .  ALPRAZolam  (XANAX) 0.25 MG tablet, Take 0.25 mg by mouth daily. , Disp: , Rfl:  .  amLODipine (NORVASC) 5 MG tablet, Take 5 mg by mouth daily., Disp: , Rfl:  .  benazepril (LOTENSIN) 40 MG tablet, Take 40 mg by mouth daily., Disp: , Rfl:  .  fenofibrate micronized (LOFIBRA) 134 MG capsule, Take 134 mg by mouth Daily. Reported on 08/02/2015, Disp: , Rfl:  .  levothyroxine (SYNTHROID) 100 MCG tablet, Take 100 mcg by mouth daily., Disp: , Rfl:  .  olmesartan (BENICAR) 40 MG tablet, Take 40 mg by mouth daily., Disp: , Rfl:  .  verapamil (VERELAN PM) 240 MG 24 hr capsule, Take 240 mg by mouth at bedtime., Disp: , Rfl:

## 2017-05-06 NOTE — Patient Instructions (Addendum)
Pulmonary hypertension: We will check a 6 minute walk I'm glad that your recent echocardiogram was actually improved compared to last one we have performed in 2013  Obstructive sleep apnea: Keep using CPAP with 2 L of oxygen every night  Sarcoidosis: There is no reason for residue anything different right now I'm glad you got a flu shot this year  I will see you back in one year or sooner if needed

## 2017-12-08 ENCOUNTER — Other Ambulatory Visit: Payer: Self-pay | Admitting: Internal Medicine

## 2017-12-08 DIAGNOSIS — M7989 Other specified soft tissue disorders: Secondary | ICD-10-CM

## 2017-12-14 ENCOUNTER — Ambulatory Visit
Admission: RE | Admit: 2017-12-14 | Discharge: 2017-12-14 | Disposition: A | Discharge status: Home / Self Care | Payer: Medicare Other | Source: Ambulatory Visit | Attending: Internal Medicine | Admitting: Internal Medicine

## 2017-12-14 DIAGNOSIS — M7989 Other specified soft tissue disorders: Secondary | ICD-10-CM | POA: Diagnosis not present

## 2018-02-25 ENCOUNTER — Other Ambulatory Visit: Payer: Self-pay | Admitting: Internal Medicine

## 2018-02-25 DIAGNOSIS — Z1231 Encounter for screening mammogram for malignant neoplasm of breast: Secondary | ICD-10-CM

## 2018-03-18 ENCOUNTER — Ambulatory Visit
Admission: RE | Admit: 2018-03-18 | Discharge: 2018-03-18 | Disposition: A | Discharge status: Home / Self Care | Payer: Medicare Other | Source: Ambulatory Visit | Attending: Internal Medicine | Admitting: Internal Medicine

## 2018-03-18 DIAGNOSIS — Z1231 Encounter for screening mammogram for malignant neoplasm of breast: Secondary | ICD-10-CM | POA: Insufficient documentation

## 2018-04-06 ENCOUNTER — Emergency Department: Payer: Medicare Other

## 2018-04-06 ENCOUNTER — Encounter: Payer: Self-pay | Admitting: Emergency Medicine

## 2018-04-06 ENCOUNTER — Emergency Department
Admission: EM | Admit: 2018-04-06 | Discharge: 2018-04-07 | Disposition: A | Discharge status: Home / Self Care | Payer: Medicare Other | Attending: Emergency Medicine | Admitting: Emergency Medicine

## 2018-04-06 DIAGNOSIS — Z79899 Other long term (current) drug therapy: Secondary | ICD-10-CM | POA: Insufficient documentation

## 2018-04-06 DIAGNOSIS — E039 Hypothyroidism, unspecified: Secondary | ICD-10-CM | POA: Insufficient documentation

## 2018-04-06 DIAGNOSIS — Z85828 Personal history of other malignant neoplasm of skin: Secondary | ICD-10-CM | POA: Diagnosis not present

## 2018-04-06 DIAGNOSIS — I447 Left bundle-branch block, unspecified: Secondary | ICD-10-CM | POA: Diagnosis not present

## 2018-04-06 DIAGNOSIS — I10 Essential (primary) hypertension: Secondary | ICD-10-CM | POA: Diagnosis not present

## 2018-04-06 DIAGNOSIS — R531 Weakness: Secondary | ICD-10-CM

## 2018-04-06 LAB — CBC WITH DIFFERENTIAL/PLATELET
BASOS PCT: 1 %
Basophils Absolute: 0.1 10*3/uL (ref 0–0.1)
Eosinophils Absolute: 0.2 10*3/uL (ref 0–0.7)
Eosinophils Relative: 4 %
HCT: 40 % (ref 35.0–47.0)
HEMOGLOBIN: 14.2 g/dL (ref 12.0–16.0)
Lymphocytes Relative: 28 %
Lymphs Abs: 1.5 10*3/uL (ref 1.0–3.6)
MCH: 32.1 pg (ref 26.0–34.0)
MCHC: 35.6 g/dL (ref 32.0–36.0)
MCV: 90.2 fL (ref 80.0–100.0)
MONOS PCT: 11 %
Monocytes Absolute: 0.6 10*3/uL (ref 0.2–0.9)
NEUTROS PCT: 56 %
Neutro Abs: 2.9 10*3/uL (ref 1.4–6.5)
Platelets: 282 10*3/uL (ref 150–440)
RBC: 4.44 MIL/uL (ref 3.80–5.20)
RDW: 12.4 % (ref 11.5–14.5)
WBC: 5.2 10*3/uL (ref 3.6–11.0)

## 2018-04-06 LAB — URINALYSIS, COMPLETE (UACMP) WITH MICROSCOPIC
BILIRUBIN URINE: NEGATIVE
Glucose, UA: NEGATIVE mg/dL
Hgb urine dipstick: NEGATIVE
KETONES UR: NEGATIVE mg/dL
LEUKOCYTES UA: NEGATIVE
NITRITE: NEGATIVE
PROTEIN: NEGATIVE mg/dL
Specific Gravity, Urine: 1.002 — ABNORMAL LOW (ref 1.005–1.030)
pH: 8 (ref 5.0–8.0)

## 2018-04-06 LAB — BRAIN NATRIURETIC PEPTIDE: B NATRIURETIC PEPTIDE 5: 80 pg/mL (ref 0.0–100.0)

## 2018-04-06 LAB — COMPREHENSIVE METABOLIC PANEL
ALK PHOS: 44 U/L (ref 38–126)
ALT: 21 U/L (ref 0–44)
ANION GAP: 8 (ref 5–15)
AST: 29 U/L (ref 15–41)
Albumin: 5.1 g/dL — ABNORMAL HIGH (ref 3.5–5.0)
BUN: 17 mg/dL (ref 8–23)
CALCIUM: 10.1 mg/dL (ref 8.9–10.3)
CHLORIDE: 105 mmol/L (ref 98–111)
CO2: 28 mmol/L (ref 22–32)
Creatinine, Ser: 0.93 mg/dL (ref 0.44–1.00)
GFR calc non Af Amer: 59 mL/min — ABNORMAL LOW (ref >60)
Glucose, Bld: 138 mg/dL — ABNORMAL HIGH (ref 70–99)
Potassium: 3.8 mmol/L (ref 3.5–5.1)
SODIUM: 141 mmol/L (ref 135–145)
Total Bilirubin: 0.6 mg/dL (ref 0.3–1.2)
Total Protein: 8 g/dL (ref 6.5–8.1)

## 2018-04-06 LAB — TROPONIN I: Troponin I: <0.03 ng/mL (ref <0.03)

## 2018-04-06 NOTE — ED Provider Notes (Signed)
[facility] Emergency Department Provider Note   ____________________________________________   First MD Initiated Contact with Patient 04/06/18 1946     (approximate)  I have reviewed the triage vital signs and the nursing notes.   HISTORY  Chief Complaint Hypertension and Weakness    HPI Kristi Barrera is a 77 y.o. female who comes in the emergency room complaining of hypertension not feeling right and weakness.  Patient reports she noted her heart palpitating earlier today.  She says this usually happens when her blood pressures up.  She took it and it was up somewhat in the [place].  Over the course of the day continued to go up higher ~got to the 200s.  At that point patient called EMS.  She was feeling diffusely weak and ill.  Did not have any shortness of breath chest pain or nausea though.  Most took her blood pressure when they got there and found it was 182/64.  Patient does have a leaky heart valve history and a history of hypertension.  She saw Dr. Doy Hutching very recently and he did a renal function panel and a TSH.  These results are pending.  She is blood pressure here in the ER is 505 systolic.  She reports she is feeling a lot better now.  Past Medical History:  Diagnosis Date  . Abnormal cardiac valve    LEAKING HEART VALVE  . Anxiety   . Arthritis   . Chest pain   . Chronic low back pain   . Colitis, chronic, ulcerative ([place])   . Dysrhythmia   . GERD (gastroesophageal reflux disease)   . HTN (hypertension)   . Hyperlipidemia   . Hypothyroidism   . Low blood potassium   . Palpitations   . Recurrent UTI   . Sarcoidosis    [place]---[facility] 15-11yrs ago  . Sinusitis   . Skin cancer   . Sleep apnea    cpap with 2 l o2  . Thyroid disease   . Vitamin D deficiency     Patient Active Problem List   Diagnosis Date Noted  . GI bleed 08/02/2015  . Nocturnal hypoxemia 02/14/2013  . OSA (obstructive sleep apnea) 02/29/2012  .  PAH (pulmonary artery hypertension) ([place]) 11/17/2011  . Sarcoidosis ([place]) 11/17/2011    Past Surgical History:  Procedure Laterality Date  . BREAST CYST ASPIRATION Left    neg  . CATARACT EXTRACTION W/PHACO Right 04/06/2017   Procedure: CATARACT EXTRACTION PHACO AND INTRAOCULAR LENS PLACEMENT (IOC);  Surgeon: Birder Robson, MD;  Location: [facility] ORS;  Service: Ophthalmology;  Laterality: Right;  [place] 00:36.1 AP% 18.5 CDE 6.66 fluid Pack lot # [id number]  . COLONOSCOPY WITH PROPOFOL N/A 08/06/2015   Procedure: COLONOSCOPY WITH PROPOFOL;  Surgeon: Manya Silvas, MD;  Location: [facility] ENDOSCOPY;  Service: Endoscopy;  Laterality: N/A;  . COLONOSCOPY WITH PROPOFOL N/A 01/15/2016   Procedure: COLONOSCOPY WITH PROPOFOL;  Surgeon: Manya Silvas, MD;  Location: [facility] ENDOSCOPY;  Service: Endoscopy;  Laterality: N/A;  . Urinary procedure      Prior to Admission medications   Medication Sig Start Date End Date Taking? Authorizing Provider  ALPRAZolam (XANAX) 0.25 MG tablet Take 0.25 mg by mouth 3 (three) times daily.    Yes [provider]  amLODipine (NORVASC) 5 MG tablet Take 5 mg by mouth daily.   Yes [provider]  benazepril (LOTENSIN) 40 MG tablet Take 40 mg by mouth daily.   Yes [provider]  fenofibrate  micronized (LOFIBRA) 134 MG capsule Take 134 mg by mouth Daily.  11/03/11  Yes [provider]  levothyroxine (SYNTHROID, LEVOTHROID) 75 MCG tablet Take 75 mcg by mouth daily before breakfast.   Yes [provider]  olmesartan (BENICAR) 40 MG tablet Take 40 mg by mouth daily.   Yes [provider]  ranitidine (ZANTAC) 150 MG tablet Take 150 mg by mouth 2 (two) times daily.   Yes [provider]  verapamil (CALAN-SR) 240 MG CR tablet Take 240 mg by mouth at bedtime.    Yes [provider]    Allergies Iohexol; Clonidine derivatives; Floxin [ofloxacin]; Hydralazine hcl; Lotrel [amlodipine besy-benazepril hcl];  Potassium-containing compounds; Protonix [pantoprazole sodium]; Sulfa antibiotics; and Toprol xl [metoprolol succinate]  Family History  Problem Relation Age of Onset  . Heart disease Father        MI  . Stroke Father   . Pneumonia Father   . Hypertension Father   . Stroke Mother   . Rheum arthritis Mother   . Breast cancer Neg Hx     Social History Social History   Tobacco Use  . Smoking status: Never Smoker  . Smokeless tobacco: Never Used  Substance Use Topics  . Alcohol use: No  . Drug use: No    Review of Systems  Constitutional: No fever/chills Eyes: No visual changes. ENT: No sore throat. Cardiovascular: Denies chest pain. Respiratory: Denies shortness of breath. Gastrointestinal: No abdominal pain.  No nausea, no vomiting.  No diarrhea.  No constipation. Genitourinary: Negative for dysuria. Musculoskeletal: Negative for back pain. Skin: Negative for rash. Neurological: Negative for headaches, focal weakness   ____________________________________________   PHYSICAL EXAM:  VITAL SIGNS: ED Triage Vitals  Enc Vitals Group     BP 04/06/18 1949 (!) 174/57     Pulse Rate 04/06/18 1949 88     Resp 04/06/18 1949 15     Temp 04/06/18 1949 97.6 F (36.4 C)     Temp Source 04/06/18 1949 Oral     SpO2 04/06/18 1943 97 %     Weight 04/06/18 1949 154 lb (69.9 kg)     Height 04/06/18 1949 5\' 2"  (1.575 m)     Head Circumference --      Peak Flow --      Pain Score 04/06/18 1949 0     Pain Loc --      Pain Edu? --      Excl. in [place]? --     Constitutional: Alert and oriented. Well appearing and in no acute distress. Eyes: Conjunctivae are normal. Head: Atraumatic. Nose: No congestion/rhinnorhea. Mouth/Throat: Mucous membranes are moist.  Oropharynx non-erythematous. Neck: No stridor.  Cardiovascular: Normal rate, regular rhythm. Grossly normal heart sounds I cannot hear a murmur at present.Kermit Balo peripheral circulation. Respiratory: Normal respiratory  effort.  No retractions. Lungs CTAB. Gastrointestinal: Soft and nontender. No distention. No abdominal bruits.  Musculoskeletal: No lower extremity tenderness slight trace bilateral edema.  No joint effusions. Neurologic:  Normal speech and language. No gross focal neurologic deficits are appreciated. Skin:  Skin is warm, dry and intact. No rash noted. Psychiatric: Mood and affect are normal. Speech and behavior are normal.  ____________________________________________   LABS (all labs ordered are listed, but only abnormal results are displayed)  Labs Reviewed  COMPREHENSIVE METABOLIC PANEL  CBC WITH DIFFERENTIAL/PLATELET  TROPONIN I  BRAIN NATRIURETIC PEPTIDE  URINALYSIS, COMPLETE (UACMP) WITH MICROSCOPIC   ____________________________________________  EKG  KG shows normal sinus rhythm rate of  90 normal axis the patient has left bundle branch block.  I reviewed the old records this was not present last year.  I do not have an EKG newer than last years. ____________________________________________  RADIOLOGY  ED MD interpretation: Chest x-ray reviewed by me shows no acute disease  Official radiology report(s): No results found.  ____________________________________________   PROCEDURES  Procedure(s) performed:   Procedures  Critical Care performed: ____________________________________________   INITIAL IMPRESSION / ASSESSMENT AND PLAN / ED COURSE  Discussed with patient we will get some lab work and urine get the radiologist report on the chest x-ray and then repeat a troponin in 2 hours.  She might be admitted if we find something else.  Otherwise we will have her follow-up with Dr. Doy Hutching and/or cardiology tomorrow.  I will have her follow-up with Dr. Cherylann Banas today.         ____________________________________________   FINAL CLINICAL IMPRESSION(S) / ED DIAGNOSES  Final diagnoses:  Weakness     ED Discharge Orders    None       Note:  This  document was prepared using Dragon voice recognition software and may include unintentional dictation errors.    Nena Polio, MD 04/06/18 2020

## 2018-04-06 NOTE — ED Triage Notes (Signed)
Pt BIB [facility] from home for hypertension - took her BP at home and it was 280/87. Pt aslo co weakness and "not feeling right". Pt co  Hx HTN & "leaking valve". 182/64 manually for EMS. Pt denies CP/SOB/N/V/D. Pt denies recent changes to medications. Pt alert & oreinted x4. ABCs intact. NAD

## 2018-04-06 NOTE — ED Notes (Signed)
Pt ambulatory to and from bathroom with steady gait. Urine specimen obtained and sent to lab

## 2018-04-07 LAB — TROPONIN I: Troponin I: <0.03 ng/mL (ref <0.03)

## 2018-04-07 NOTE — ED Provider Notes (Signed)
-----------------------------------------   12:08 AM on 04/07/2018 -----------------------------------------  I took over care of this patient from Dr. Rip Harbour.  The plan was to repeat the troponin and discharge if negative.  Repeat troponin is negative.  The patient has remained asymptomatic and her blood pressure has improved.  She is stable for discharge at this time.  Return precautions given, and she expresses understanding.   Arta Silence, MD 04/07/18 [phone]

## 2018-04-07 NOTE — Discharge Instructions (Addendum)
Make an appointment to follow with Dr. Doy Hutching and with Dr. Ubaldo Glassing.  Return to the ER for new, worsening, persistent severe weakness, severely elevated blood pressures, or any other new or worsening symptoms that concern you.

## 2018-11-01 ENCOUNTER — Ambulatory Visit: Payer: Medicare Other | Admitting: Pulmonary Disease

## 2019-02-13 ENCOUNTER — Other Ambulatory Visit: Payer: Self-pay | Admitting: Internal Medicine

## 2019-02-13 DIAGNOSIS — Z1231 Encounter for screening mammogram for malignant neoplasm of breast: Secondary | ICD-10-CM

## 2019-02-28 ENCOUNTER — Other Ambulatory Visit: Payer: Self-pay | Admitting: Internal Medicine

## 2019-02-28 DIAGNOSIS — N6325 Unspecified lump in the left breast, overlapping quadrants: Secondary | ICD-10-CM

## 2019-03-06 ENCOUNTER — Ambulatory Visit
Admission: RE | Admit: 2019-03-06 | Discharge: 2019-03-06 | Disposition: A | Discharge status: Home / Self Care | Payer: Medicare Other | Source: Ambulatory Visit | Attending: Internal Medicine | Admitting: Internal Medicine

## 2019-03-06 DIAGNOSIS — N6002 Solitary cyst of left breast: Secondary | ICD-10-CM | POA: Insufficient documentation

## 2019-03-06 DIAGNOSIS — N6320 Unspecified lump in the left breast, unspecified quadrant: Secondary | ICD-10-CM | POA: Insufficient documentation

## 2019-03-06 DIAGNOSIS — N6325 Unspecified lump in the left breast, overlapping quadrants: Secondary | ICD-10-CM

## 2019-03-07 ENCOUNTER — Other Ambulatory Visit: Payer: Self-pay | Admitting: Internal Medicine

## 2019-03-07 DIAGNOSIS — N6310 Unspecified lump in the right breast, unspecified quadrant: Secondary | ICD-10-CM

## 2019-03-10 ENCOUNTER — Other Ambulatory Visit: Payer: Medicare Other

## 2019-04-14 ENCOUNTER — Telehealth: Payer: Self-pay | Admitting: Pulmonary Disease

## 2019-04-14 NOTE — Telephone Encounter (Signed)
Call returned to patient, she reports she needs a new cpap machine but has not been seen since 2018. I made her aware I would get her set up with NP for her initial visit but she would later need to be set up to get established with another provider. Voiced understanding. Appt made for NP in office. Aware to bring cpap machine as compliance reports is not available on air view. Nothing further needed at this time.

## 2019-04-17 ENCOUNTER — Ambulatory Visit (INDEPENDENT_AMBULATORY_CARE_PROVIDER_SITE_OTHER): Payer: Medicare Other | Admitting: Primary Care

## 2019-04-17 ENCOUNTER — Other Ambulatory Visit: Payer: Self-pay

## 2019-04-17 ENCOUNTER — Ambulatory Visit (INDEPENDENT_AMBULATORY_CARE_PROVIDER_SITE_OTHER): Payer: Medicare Other

## 2019-04-17 ENCOUNTER — Encounter: Payer: Self-pay | Admitting: Primary Care

## 2019-04-17 VITALS — BP 128/80 | HR 69 | Temp 97.5°F | Ht 62.0 in | Wt 150.2 lb

## 2019-04-17 DIAGNOSIS — D869 Sarcoidosis, unspecified: Secondary | ICD-10-CM

## 2019-04-17 DIAGNOSIS — I447 Left bundle-branch block, unspecified: Secondary | ICD-10-CM | POA: Diagnosis not present

## 2019-04-17 DIAGNOSIS — G4733 Obstructive sleep apnea (adult) (pediatric): Secondary | ICD-10-CM | POA: Diagnosis not present

## 2019-04-17 DIAGNOSIS — I2721 Secondary pulmonary arterial hypertension: Secondary | ICD-10-CM | POA: Diagnosis not present

## 2019-04-17 DIAGNOSIS — G4734 Idiopathic sleep related nonobstructive alveolar hypoventilation: Secondary | ICD-10-CM

## 2019-04-17 NOTE — Patient Instructions (Addendum)
Sleep apnea: Great work wearing your CPAP mask every night  Continue CPAP 4-6 hours or more  Do not drive if experiencing excessive daytime fatigue or somnolence No changes to pressure today  Orders: XRAY today  Recommendations: Make sure to get flu shot with PCP at your physical   Follow-up 1 year with sleep doctor (previous [facility] patient) Dr. Halford Chessman or Elsworth Soho

## 2019-04-17 NOTE — Assessment & Plan Note (Signed)
-   Stable; not symptomatic - LS with rales right base - Checking CXR

## 2019-04-17 NOTE — Addendum Note (Signed)
Addended by: Hildred Alamin I on: 04/17/2019 03:19 PM   Modules accepted: Orders

## 2019-04-17 NOTE — Assessment & Plan Note (Addendum)
-   100% compliant with CPAP and reports benefit from use - Pressure 6cm h20; AHI 0.6 - No changes today - Continue to wear every night for 4-6 hours or more - Advised not to drive if experiencing excessive daytime fatigue or somnolence  - DME company is [employer]  - FU in 1 year with new sleep medicine doctor (former [facility] patient)

## 2019-04-17 NOTE — Assessment & Plan Note (Addendum)
-   2L oxygen at night with CPAP

## 2019-04-17 NOTE — Assessment & Plan Note (Addendum)
-   Continue CPAP for treatment of OSA  - O2 97% RA simple walk test, needs 6MWT at next visit

## 2019-04-17 NOTE — Progress Notes (Signed)
@Patient  ID: Kristi Barrera, female    DOB: 1941-07-06, 78 y.o.   MRN: [id number]  Chief Complaint  Patient presents with  . Follow-up    Patient reports that she is doing well. She reports that her CPAP machine is stating that the motor is going out.     Referring provider: Marguarite Arbour, MD   Synopsis: [name] was first referred to the [facility] office for pulmonary hypertension April 2013. She has a past medical history significant for sarcoidosis diagnosed by Dr. Guido Barrera at [facility] in the 1980s. She has never required treatment for this. In 2013 she was found to have a PA pressure of 51 on a transthoracic echocardiogram in the setting of no symptoms and was referred to [facility] pulmonary for further evaluation. A CT scan performed in may of 2013 showed a small match defect in the right lower lobe. An overnight sleep oximetry study on room air in April 2013 showed oxygen desaturation below 88% for 11 minutes and she was given 2 L of oxygen for nocturnal use.  HPI: 78 year old female, never smoked. PMH significant OSA, nocturnal hypoxemia, pulmonary artery hypertension, sarcoidosis. Patient of Dr. Kendrick Barrera, last seen on 05/06/17. Stable interval. Maintained on CPAP with 2L oxygen at night.   04/17/2019 Patient presents today for annual follow-up. She has not been seen in almost 2 years.  Feeling well. Reports some post nasal drip symptoms. Staying active by walking 1 mile four times a week. She is following with cardiologist for new LBBB. Compliant with CPAP. Some troubles with mask at times, uses nasal mask and moves when she is sleeping. [employer] is her DME company. Reports benefit from CPAP use, states that she does not wake up feeling panicked and feels more rested in the morning. Denies cough, chest pain or shortness of breath.    Airview download 06/30-9/27/20: 90/90 days used; 100% >4 hours  Average usage 9 hours 11 mins Pressure 6cm h20 Airleaks 23.7L/min (95th  percentile) AHI 0.6  Testing reviewed: Echo: 02/2012 TTE> RVSP 65 2018 [facility] TTE> , normal RV function, LVEF normal  -01/2013 6 minute walk distance is 304 m, did not desaturate less than 93% on room air - 6 MW 10/04/2013 > 405 m O2 saturation no lower than 98% on RA -6 MW January 2016> 409 m, O2 saturation no lower than 99% on room air.  Allergies  Allergen Reactions  . Iohexol Shortness Of Breath  . Clonidine Derivatives     Swelling, chest tightness  . Floxin [Ofloxacin] Other (See Comments)    Hallucinations   . Hydralazine Hcl     BP dropped, HR increased, weakness, nausea, syncope  . Lotrel [Amlodipine Besy-Benazepril Hcl] Itching  . Potassium-Containing Compounds Other (See Comments)    Patient states she is allergic to oral Potassium Chloride only, had chest pains and a slight rash   . Protonix [Pantoprazole Sodium] Swelling  . Sulfa Antibiotics   . Toprol Xl [Metoprolol Succinate]     fatigue    Immunization History  Administered Date(s) Administered  . Influenza Split 05/06/2013, 04/30/2014, 05/20/2015  . Influenza Whole 05/19/2011, 05/16/2012  . Influenza,inj,Quad PF,6+ Mos 05/05/2016, 05/04/2017, 05/03/2018  . Influenza-Unspecified 04/30/2013, 04/23/2014, 05/04/2017  . Pneumococcal Polysaccharide-23 08/02/2014    Past Medical History:  Diagnosis Date  . Abnormal cardiac valve    LEAKING HEART VALVE  . Anxiety   . Arthritis   . Chest pain   . Chronic low back pain   . Colitis,  chronic, ulcerative (HCC)   . Dysrhythmia   . GERD (gastroesophageal reflux disease)   . HTN (hypertension)   . Hyperlipidemia   . Hypothyroidism   . Low blood potassium   . Palpitations   . Recurrent UTI   . Sarcoidosis    [place] 15-53yrs ago  . Sinusitis   . Skin cancer   . Sleep apnea    cpap with 2 l o2  . Thyroid disease   . Vitamin D deficiency     Tobacco History: Social History   Tobacco Use  Smoking Status Never Smoker  Smokeless  Tobacco Never Used   Counseling given: Not Answered   Outpatient Medications Prior to Visit  Medication Sig Dispense Refill  . ALPRAZolam (XANAX) 0.25 MG tablet Take 0.25 mg by mouth 3 (three) times daily.     Marland Kitchen amLODipine (NORVASC) 5 MG tablet Take 5 mg by mouth daily.    . benazepril (LOTENSIN) 40 MG tablet Take 40 mg by mouth daily.    . fenofibrate micronized (LOFIBRA) 134 MG capsule Take 134 mg by mouth Daily.     Marland Kitchen levothyroxine (SYNTHROID, LEVOTHROID) 75 MCG tablet Take 75 mcg by mouth daily before breakfast.    . olmesartan (BENICAR) 40 MG tablet Take 40 mg by mouth daily.    . ranitidine (ZANTAC) 150 MG tablet Take 150 mg by mouth 2 (two) times daily.    . verapamil (CALAN-SR) 240 MG CR tablet Take 240 mg by mouth at bedtime.      No facility-administered medications prior to visit.    Review of Systems  Review of Systems  Constitutional: Negative.   HENT: Positive for postnasal drip.   Respiratory: Negative for cough, shortness of breath and wheezing.   Psychiatric/Behavioral: Negative.    Physical Exam  BP 128/80 (BP Location: Left Arm, Cuff Size: Normal)   Pulse 69   Temp (!) 97.5 F (36.4 C) (Temporal)   Ht 5\' 2"  (1.575 m)   Wt 150 lb 3.2 oz (68.1 kg)   SpO2 97%   BMI 27.47 kg/m  Physical Exam Constitutional:      General: She is not in acute distress.    Appearance: Normal appearance. She is not ill-appearing or toxic-appearing.  HENT:     Head: Normocephalic and atraumatic.     Nose: Nose normal.     Mouth/Throat:     Mouth: Mucous membranes are moist.     Comments: Posterior pharynx red Cardiovascular:     Rate and Rhythm: Normal rate and regular rhythm.     Heart sounds: No murmur.     Comments: RRR Pulmonary:     Effort: Pulmonary effort is normal. No respiratory distress.     Breath sounds: Normal breath sounds. No wheezing.     Comments: Rales right base, otherwise normal Musculoskeletal: Normal range of motion.  Skin:    General: Skin is  warm and dry.  Neurological:     General: No focal deficit present.     Mental Status: She is alert and oriented to person, place, and time. Mental status is at baseline.  Psychiatric:        Mood and Affect: Mood normal.        Behavior: Behavior normal.        Thought Content: Thought content normal.        Judgment: Judgment normal.      Lab Results:  CBC    Component Value Date/Time   WBC 5.2 04/06/2018 2018  RBC 4.44 04/06/2018 2018   HGB 14.2 04/06/2018 2018   HGB 11.9 (L) 07/10/2013 1350   HCT 40.0 04/06/2018 2018   HCT 36.7 07/10/2013 1350   PLT 282 04/06/2018 2018   PLT 241 07/10/2013 1350   MCV 90.2 04/06/2018 2018   MCV 90 07/10/2013 1350   MCH 32.1 04/06/2018 2018   MCHC 35.6 04/06/2018 2018   RDW 12.4 04/06/2018 2018   RDW 12.1 07/10/2013 1350   LYMPHSABS 1.5 04/06/2018 2018   MONOABS 0.6 04/06/2018 2018   EOSABS 0.2 04/06/2018 2018   BASOSABS 0.1 04/06/2018 2018    BMET    Component Value Date/Time   NA 141 04/06/2018 2018   NA 143 07/10/2013 1350   K 3.8 04/06/2018 2018   K 3.6 07/10/2013 1350   CL 105 04/06/2018 2018   CL 110 (H) 07/10/2013 1350   CO2 28 04/06/2018 2018   CO2 27 07/10/2013 1350   GLUCOSE 138 (H) 04/06/2018 2018   GLUCOSE 85 07/10/2013 1350   BUN 17 04/06/2018 2018   BUN 15 07/10/2013 1350   CREATININE 0.93 04/06/2018 2018   CREATININE 0.83 07/10/2013 1350   CALCIUM 10.1 04/06/2018 2018   CALCIUM 9.1 07/10/2013 1350   GFRNONAA 59 (L) 04/06/2018 2018   GFRNONAA >60 07/10/2013 1350   GFRAA >60 04/06/2018 2018   GFRAA >60 07/10/2013 1350    BNP    Component Value Date/Time   BNP 80.0 04/06/2018 2018    ProBNP No results found for: PROBNP  Imaging: No results found.   Assessment & Plan:   OSA (obstructive sleep apnea) - 100% compliant with CPAP and reports benefit from use - Pressure 6cm h20; AHI 0.6 - No changes today - Continue to wear every night for 4-6 hours or more - Advised not to drive if  experiencing excessive daytime fatigue or somnolence  - DME company is [employer]  - FU in 1 year with new sleep medicine doctor (former [facility] patient)  PAH (pulmonary artery hypertension) - Continue CPAP for treatment of OSA  - O2 97% RA simple walk test, needs at next visit   Left bundle branch block - Intermittent left bundle branch block (EKG in 2019) - Showed no high-grade ischemia. Continue to follow for further symptoms - Following with [facility]ology, most recently seen in May 2020  Nocturnal hypoxemia - 2L oxygen at night with CPAP  Sarcoidosis (HCC) - Stable; not symptomatic - LS with rales right base - Checking CXR     Glenford Bayley, NP 04/17/2019

## 2019-04-17 NOTE — Assessment & Plan Note (Addendum)
-   Intermittent left bundle branch block (EKG in 2019) - Showed no high-grade ischemia. Continue to follow for further symptoms - Following with [facility]ology, most recently seen in May 2020

## 2019-04-20 NOTE — Progress Notes (Signed)
Reviewed, agree 

## 2019-09-07 ENCOUNTER — Other Ambulatory Visit: Payer: Medicare Other

## 2019-09-15 ENCOUNTER — Ambulatory Visit
Admission: RE | Admit: 2019-09-15 | Discharge: 2019-09-15 | Disposition: A | Discharge status: Home / Self Care | Payer: Medicare Other | Source: Ambulatory Visit | Attending: Internal Medicine | Admitting: Internal Medicine

## 2019-09-15 DIAGNOSIS — N6310 Unspecified lump in the right breast, unspecified quadrant: Secondary | ICD-10-CM

## 2019-09-15 DIAGNOSIS — N6312 Unspecified lump in the right breast, upper inner quadrant: Secondary | ICD-10-CM | POA: Diagnosis not present

## 2019-09-15 DIAGNOSIS — N6001 Solitary cyst of right breast: Secondary | ICD-10-CM | POA: Diagnosis not present

## 2019-09-20 ENCOUNTER — Other Ambulatory Visit: Payer: Self-pay | Admitting: Internal Medicine

## 2019-09-20 DIAGNOSIS — N6001 Solitary cyst of right breast: Secondary | ICD-10-CM

## 2019-09-20 DIAGNOSIS — R928 Other abnormal and inconclusive findings on diagnostic imaging of breast: Secondary | ICD-10-CM

## 2019-09-25 ENCOUNTER — Ambulatory Visit
Admission: RE | Admit: 2019-09-25 | Discharge: 2019-09-25 | Disposition: A | Discharge status: Home / Self Care | Payer: Medicare Other | Source: Ambulatory Visit | Attending: Internal Medicine | Admitting: Internal Medicine

## 2019-09-25 DIAGNOSIS — R928 Other abnormal and inconclusive findings on diagnostic imaging of breast: Secondary | ICD-10-CM

## 2019-09-25 DIAGNOSIS — N6001 Solitary cyst of right breast: Secondary | ICD-10-CM | POA: Diagnosis present

## 2019-09-25 HISTORY — PX: BREAST CYST ASPIRATION: SHX578

## 2019-11-02 ENCOUNTER — Other Ambulatory Visit: Payer: Self-pay | Admitting: Internal Medicine

## 2019-11-02 DIAGNOSIS — R1084 Generalized abdominal pain: Secondary | ICD-10-CM

## 2019-11-08 ENCOUNTER — Ambulatory Visit
Admission: RE | Admit: 2019-11-08 | Discharge: 2019-11-08 | Disposition: A | Discharge status: Home / Self Care | Payer: Medicare Other | Source: Ambulatory Visit | Attending: Internal Medicine | Admitting: Internal Medicine

## 2019-11-08 ENCOUNTER — Other Ambulatory Visit: Payer: Self-pay

## 2019-11-08 DIAGNOSIS — R1084 Generalized abdominal pain: Secondary | ICD-10-CM | POA: Diagnosis not present

## 2020-03-20 ENCOUNTER — Other Ambulatory Visit: Payer: Self-pay | Admitting: Internal Medicine

## 2020-03-20 DIAGNOSIS — Z1231 Encounter for screening mammogram for malignant neoplasm of breast: Secondary | ICD-10-CM

## 2020-04-03 ENCOUNTER — Ambulatory Visit
Admission: RE | Admit: 2020-04-03 | Discharge: 2020-04-03 | Disposition: A | Discharge status: Home / Self Care | Payer: Medicare Other | Source: Ambulatory Visit | Attending: Internal Medicine | Admitting: Internal Medicine

## 2020-04-03 ENCOUNTER — Other Ambulatory Visit: Payer: Self-pay

## 2020-04-03 DIAGNOSIS — Z1231 Encounter for screening mammogram for malignant neoplasm of breast: Secondary | ICD-10-CM | POA: Diagnosis present

## 2021-03-31 ENCOUNTER — Other Ambulatory Visit: Payer: Self-pay | Admitting: Internal Medicine

## 2021-03-31 DIAGNOSIS — Z1231 Encounter for screening mammogram for malignant neoplasm of breast: Secondary | ICD-10-CM

## 2021-04-14 ENCOUNTER — Ambulatory Visit: Admission: RE | Admit: 2021-04-14 | Payer: Medicare Other | Source: Home / Self Care | Admitting: Ophthalmology

## 2021-04-14 ENCOUNTER — Encounter: Admission: RE | Payer: Self-pay | Source: Home / Self Care

## 2021-04-14 SURGERY — PHACOEMULSIFICATION, CATARACT, WITH IOL INSERTION
Anesthesia: Topical | Laterality: Left

## 2021-04-21 ENCOUNTER — Other Ambulatory Visit: Payer: Self-pay

## 2021-04-21 ENCOUNTER — Ambulatory Visit
Admission: RE | Admit: 2021-04-21 | Discharge: 2021-04-21 | Disposition: A | Discharge status: Home / Self Care | Payer: Medicare Other | Source: Ambulatory Visit | Attending: Internal Medicine | Admitting: Internal Medicine

## 2021-04-21 DIAGNOSIS — Z1231 Encounter for screening mammogram for malignant neoplasm of breast: Secondary | ICD-10-CM | POA: Diagnosis present

## 2021-04-28 ENCOUNTER — Other Ambulatory Visit: Payer: Self-pay | Admitting: Internal Medicine

## 2021-04-28 DIAGNOSIS — N6310 Unspecified lump in the right breast, unspecified quadrant: Secondary | ICD-10-CM

## 2021-04-28 DIAGNOSIS — N6320 Unspecified lump in the left breast, unspecified quadrant: Secondary | ICD-10-CM

## 2021-04-28 DIAGNOSIS — R928 Other abnormal and inconclusive findings on diagnostic imaging of breast: Secondary | ICD-10-CM

## 2021-05-05 ENCOUNTER — Ambulatory Visit
Admission: RE | Admit: 2021-05-05 | Discharge: 2021-05-05 | Disposition: A | Discharge status: Home / Self Care | Payer: Medicare Other | Source: Ambulatory Visit | Attending: Internal Medicine | Admitting: Internal Medicine

## 2021-05-05 ENCOUNTER — Other Ambulatory Visit: Payer: Self-pay

## 2021-05-05 DIAGNOSIS — R928 Other abnormal and inconclusive findings on diagnostic imaging of breast: Secondary | ICD-10-CM | POA: Diagnosis not present

## 2021-05-05 DIAGNOSIS — N6310 Unspecified lump in the right breast, unspecified quadrant: Secondary | ICD-10-CM

## 2021-05-05 DIAGNOSIS — N6320 Unspecified lump in the left breast, unspecified quadrant: Secondary | ICD-10-CM | POA: Diagnosis present

## 2021-07-20 ENCOUNTER — Emergency Department
Admission: EM | Admit: 2021-07-20 | Discharge: 2021-07-20 | Disposition: A | Discharge status: Home / Self Care | Payer: Medicare Other | Attending: Emergency Medicine | Admitting: Emergency Medicine

## 2021-07-20 ENCOUNTER — Encounter: Payer: Self-pay | Admitting: Emergency Medicine

## 2021-07-20 ENCOUNTER — Other Ambulatory Visit: Payer: Self-pay

## 2021-07-20 ENCOUNTER — Emergency Department: Payer: Medicare Other

## 2021-07-20 DIAGNOSIS — R0602 Shortness of breath: Secondary | ICD-10-CM | POA: Diagnosis not present

## 2021-07-20 DIAGNOSIS — R0789 Other chest pain: Secondary | ICD-10-CM | POA: Diagnosis not present

## 2021-07-20 DIAGNOSIS — R079 Chest pain, unspecified: Secondary | ICD-10-CM

## 2021-07-20 LAB — BASIC METABOLIC PANEL
Anion gap: 7 (ref 5–15)
BUN: 14 mg/dL (ref 8–23)
CO2: 27 mmol/L (ref 22–32)
Calcium: 9.7 mg/dL (ref 8.9–10.3)
Chloride: 100 mmol/L (ref 98–111)
Creatinine, Ser: 0.84 mg/dL (ref 0.44–1.00)
GFR, Estimated: >60 mL/min (ref >60)
Glucose, Bld: 99 mg/dL (ref 70–99)
Potassium: 3.8 mmol/L (ref 3.5–5.1)
Sodium: 134 mmol/L — ABNORMAL LOW (ref 135–145)

## 2021-07-20 LAB — CBC
HCT: 43.3 % (ref 36.0–46.0)
Hemoglobin: 14.4 g/dL (ref 12.0–15.0)
MCH: 30.2 pg (ref 26.0–34.0)
MCHC: 33.3 g/dL (ref 30.0–36.0)
MCV: 90.8 fL (ref 80.0–100.0)
Platelets: 275 10*3/uL (ref 150–400)
RBC: 4.77 MIL/uL (ref 3.87–5.11)
RDW: 11.5 % (ref 11.5–15.5)
WBC: 6.1 10*3/uL (ref 4.0–10.5)
nRBC: 0 % (ref 0.0–0.2)

## 2021-07-20 LAB — BRAIN NATRIURETIC PEPTIDE: B Natriuretic Peptide: 74.7 pg/mL (ref 0.0–100.0)

## 2021-07-20 LAB — D-DIMER, QUANTITATIVE: D-Dimer, Quant: <0.27 ug/mL-FEU (ref 0.00–0.50)

## 2021-07-20 LAB — HEPATIC FUNCTION PANEL
ALT: 17 U/L (ref 0–44)
AST: 22 U/L (ref 15–41)
Albumin: 4.2 g/dL (ref 3.5–5.0)
Alkaline Phosphatase: 63 U/L (ref 38–126)
Bilirubin, Direct: <0.1 mg/dL (ref 0.0–0.2)
Total Bilirubin: 0.8 mg/dL (ref 0.3–1.2)
Total Protein: 7.6 g/dL (ref 6.5–8.1)

## 2021-07-20 LAB — TROPONIN I (HIGH SENSITIVITY)
Troponin I (High Sensitivity): 5 ng/L (ref <18)
Troponin I (High Sensitivity): 7 ng/L (ref <18)

## 2021-07-20 MED ORDER — ESOMEPRAZOLE MAGNESIUM 40 MG PO CPDR: 40.0000 mg | DELAYED_RELEASE_CAPSULE | Freq: Every day | ORAL | 0 refills | Status: DC

## 2021-07-20 MED ORDER — ACETAMINOPHEN 500 MG PO TABS: 1000.0000 mg | ORAL_TABLET | Freq: Four times a day (QID) | ORAL | 2 refills | Status: AC | PRN

## 2021-07-20 MED ORDER — SUCRALFATE 1 G PO TABS: 1.0000 g | ORAL_TABLET | Freq: Three times a day (TID) | ORAL | 0 refills | Status: DC

## 2021-07-20 NOTE — Discharge Instructions (Signed)
As we discussed, we are going to start an antacid regimen.  You have taken Nexium before based on your records, though Protonix was listed as causing swelling. I've prescribed Nexium 40 mg daily. You can also buy this over-the-counter now.  Take the carafate with meals for 1 week to help coat your stomach/esophagus.  Take tylenol as needed for pain.  Avoid foods high in fat or acid.

## 2021-07-20 NOTE — ED Provider Notes (Signed)
[facility] Provider Note    Event Date/Time   First MD Initiated Contact with Patient 07/20/21 1351     (approximate)   History   Chest Pain and Shortness of Breath   HPI  Kristi Barrera is a 81 y.o. female   with past medical history of sleep apnea, sarcoidosis, gastritis/upper GI issues, here with sharp, intermittent left upper chest pain.  The patient states that for the last several days, she has had intermittent but progressively worsening sharp, also pressure-like left upper chest pain.  This seems to come and go randomly but is also often associated with eating.  She has occasional nausea with it.  It did improve slightly when she stood up and began walking around.  She has a history of recurrent upper GI issues though she has never had an endoscopy before.  Denies known coronary history but has had atrial fibrillation and is on an anticoagulant.  Denies known history of coronary disease.  Denies history of blood clots.  No recent immobilization or leg swelling.    Physical Exam   Triage Vital Signs: ED Triage Vitals  Enc Vitals Group     BP 07/20/21 1346 (!) 200/61     Pulse Rate 07/20/21 1346 64     Resp 07/20/21 1346 20     Temp 07/20/21 1346 98 F (36.7 C)     Temp Source 07/20/21 1346 Oral     SpO2 07/20/21 1346 96 %     Weight 07/20/21 1334 150 lb (68 kg)     Height 07/20/21 1334 5\' 2"  (1.575 m)     Head Circumference --      Peak Flow --      Pain Score 07/20/21 1334 8     Pain Loc --      Pain Edu? --      Excl. in [place]? --     Most recent vital signs: Vitals:   07/20/21 1515 07/20/21 1530  BP:    Pulse: (!) 58 63  Resp: 17 18  Temp:    SpO2: 99% 100%     General: Awake, no distress.  CV:  Good peripheral perfusion.  Pulses 2+ and symmetric.  Slight crescendo-decrescendo murmur in the right upper sternal border. Resp:  Normal effort.  Abd:  No distention.  Mild left upper quadrant tenderness.  No rebound or  guarding. Other:  Testing for chest wall tenderness or bruising.  No lesions.     ED Results / Procedures / Treatments   Labs (all labs ordered are listed, but only abnormal results are displayed) Labs Reviewed  BASIC METABOLIC PANEL - Abnormal; Notable for the following components:      Result Value   Sodium 134 (*)    All other components within normal limits  CBC  BRAIN NATRIURETIC PEPTIDE  HEPATIC FUNCTION PANEL  D-DIMER, QUANTITATIVE  TROPONIN I (HIGH SENSITIVITY)  TROPONIN I (HIGH SENSITIVITY)     EKG  Normal sinus rhythm, ventricular rate 65.  PR 170, QRS 146, QTc 43.  No acute ST elevations or depressions.  Left bundle branch block, unchanged from prior.   RADIOLOGY Chest x-ray reviewed by me, shows no active disease.  Agree with radiologist report   PROCEDURES:  Critical Care performed: No  Procedures   MEDICATIONS ORDERED IN ED: Medications - No data to display   IMPRESSION / MDM / [place] / ED COURSE  I reviewed the triage vital signs and the nursing  notes.                              Differential diagnosis includes, but is not limited to, GERD/gastritis, esophageal spasm, achalasia, chest wall pain, PE, less likely ACS.  Pain is not consistent with dissection.  The patient is on the cardiac monitor to evaluate for evidence of arrhythmia and/or significant heart rate changes.  81 year old female with past medical history as above including sarcoidosis and recurrent upper GI issues here with fairly atypical left upper chest pain.  Clinically, suspect likely GI pain with possible component of esophageal spasm.  It does seems partially related to eating.  Lab work is overall very reassuring.  D-dimer negative, do not suspect ACS.  No significant leukocytosis or anemia.  BNP normal.  Troponins negative x2, do not suspect ACS clinically.  EKG nonischemic.  Chest x-ray reviewed and is clear.  LFTs normal.  No significant abdominal tenderness  suggest cholecystitis or gallbladder issue.  Patient reassured.  She has no ongoing pain here.  I reviewed her previous records which showed no evidence of significant Coronary disease.  She seems to have good follow-up with Dr. Doy Hutching based on my review of her records.  She has also seen GI previously.  Will encourage her to start antacid regimen, with prescription of esomeprazole as well as Carafate.  She has tolerated this previously based on review, though she has Protonix listed as causing swelling.  Encouraged follow-up with GI.       FINAL CLINICAL IMPRESSION(S) / ED DIAGNOSES   Final diagnoses:  Atypical chest pain     Rx / DC Orders   ED Discharge Orders          Ordered    esomeprazole (NEXIUM) 40 MG capsule  Daily        07/20/21 1602    sucralfate (CARAFATE) 1 g tablet  3 times daily with meals & bedtime        07/20/21 1602    acetaminophen (TYLENOL) 500 MG tablet  Every 6 hours PRN        07/20/21 1602             Note:  This document was prepared using Dragon voice recognition software and may include unintentional dictation errors.   Duffy Bruce, MD 07/20/21 [phone]

## 2021-07-20 NOTE — ED Notes (Signed)
Per Dr. Quentin Cornwall, repeat EKG. Pt placed in room 16, Kim, NT at bedside to repeat EKG in the room.

## 2021-07-20 NOTE — ED Triage Notes (Signed)
Pt reports last night started with CP under her left breast that is sharp in nature and intermittent. Pt reports pain is non radiating and she feels SOB and nauseated at times.

## 2021-07-20 NOTE — ED Notes (Signed)
Dr. Cinda Quest ok'd pt. For discharge.

## 2021-07-30 ENCOUNTER — Emergency Department: Payer: Medicare Other

## 2021-07-30 ENCOUNTER — Inpatient Hospital Stay
Admit: 2021-07-30 | Discharge: 2021-07-30 | Disposition: A | Discharge status: Home / Self Care | Payer: Medicare Other | Attending: Cardiology | Admitting: Cardiology

## 2021-07-30 ENCOUNTER — Other Ambulatory Visit: Payer: Self-pay

## 2021-07-30 ENCOUNTER — Encounter: Payer: Self-pay | Admitting: Emergency Medicine

## 2021-07-30 ENCOUNTER — Inpatient Hospital Stay
Admission: EM | Admit: 2021-07-30 | Discharge: 2021-08-01 | DRG: 309 | Disposition: A | Discharge status: Home / Self Care | Payer: Medicare Other | Attending: Internal Medicine | Admitting: Internal Medicine

## 2021-07-30 DIAGNOSIS — I10 Essential (primary) hypertension: Secondary | ICD-10-CM | POA: Diagnosis not present

## 2021-07-30 DIAGNOSIS — Z7901 Long term (current) use of anticoagulants: Secondary | ICD-10-CM

## 2021-07-30 DIAGNOSIS — Z8249 Family history of ischemic heart disease and other diseases of the circulatory system: Secondary | ICD-10-CM | POA: Diagnosis not present

## 2021-07-30 DIAGNOSIS — I160 Hypertensive urgency: Secondary | ICD-10-CM | POA: Diagnosis present

## 2021-07-30 DIAGNOSIS — I4891 Unspecified atrial fibrillation: Secondary | ICD-10-CM | POA: Insufficient documentation

## 2021-07-30 DIAGNOSIS — Z888 Allergy status to other drugs, medicaments and biological substances status: Secondary | ICD-10-CM | POA: Diagnosis not present

## 2021-07-30 DIAGNOSIS — R5383 Other fatigue: Secondary | ICD-10-CM | POA: Diagnosis present

## 2021-07-30 DIAGNOSIS — Z9841 Cataract extraction status, right eye: Secondary | ICD-10-CM | POA: Diagnosis not present

## 2021-07-30 DIAGNOSIS — Z823 Family history of stroke: Secondary | ICD-10-CM | POA: Diagnosis not present

## 2021-07-30 DIAGNOSIS — Z8261 Family history of arthritis: Secondary | ICD-10-CM | POA: Diagnosis not present

## 2021-07-30 DIAGNOSIS — Z961 Presence of intraocular lens: Secondary | ICD-10-CM | POA: Diagnosis present

## 2021-07-30 DIAGNOSIS — E785 Hyperlipidemia, unspecified: Secondary | ICD-10-CM | POA: Diagnosis present

## 2021-07-30 DIAGNOSIS — Z79899 Other long term (current) drug therapy: Secondary | ICD-10-CM

## 2021-07-30 DIAGNOSIS — Z85828 Personal history of other malignant neoplasm of skin: Secondary | ICD-10-CM | POA: Diagnosis not present

## 2021-07-30 DIAGNOSIS — Z87440 Personal history of urinary (tract) infections: Secondary | ICD-10-CM

## 2021-07-30 DIAGNOSIS — Z20822 Contact with and (suspected) exposure to covid-19: Secondary | ICD-10-CM | POA: Diagnosis present

## 2021-07-30 DIAGNOSIS — K219 Gastro-esophageal reflux disease without esophagitis: Secondary | ICD-10-CM | POA: Diagnosis present

## 2021-07-30 DIAGNOSIS — E039 Hypothyroidism, unspecified: Secondary | ICD-10-CM | POA: Diagnosis present

## 2021-07-30 DIAGNOSIS — I480 Paroxysmal atrial fibrillation: Secondary | ICD-10-CM | POA: Diagnosis present

## 2021-07-30 DIAGNOSIS — I5032 Chronic diastolic (congestive) heart failure: Secondary | ICD-10-CM | POA: Diagnosis present

## 2021-07-30 DIAGNOSIS — D869 Sarcoidosis, unspecified: Secondary | ICD-10-CM | POA: Diagnosis present

## 2021-07-30 DIAGNOSIS — Z91041 Radiographic dye allergy status: Secondary | ICD-10-CM | POA: Diagnosis not present

## 2021-07-30 DIAGNOSIS — R079 Chest pain, unspecified: Secondary | ICD-10-CM

## 2021-07-30 DIAGNOSIS — Z79890 Hormone replacement therapy (postmenopausal): Secondary | ICD-10-CM

## 2021-07-30 DIAGNOSIS — Z881 Allergy status to other antibiotic agents status: Secondary | ICD-10-CM | POA: Diagnosis not present

## 2021-07-30 DIAGNOSIS — R0602 Shortness of breath: Secondary | ICD-10-CM | POA: Diagnosis present

## 2021-07-30 DIAGNOSIS — I447 Left bundle-branch block, unspecified: Secondary | ICD-10-CM | POA: Diagnosis present

## 2021-07-30 DIAGNOSIS — F419 Anxiety disorder, unspecified: Secondary | ICD-10-CM | POA: Diagnosis present

## 2021-07-30 DIAGNOSIS — Z9981 Dependence on supplemental oxygen: Secondary | ICD-10-CM | POA: Diagnosis not present

## 2021-07-30 DIAGNOSIS — I110 Hypertensive heart disease with heart failure: Secondary | ICD-10-CM | POA: Diagnosis present

## 2021-07-30 DIAGNOSIS — G4733 Obstructive sleep apnea (adult) (pediatric): Secondary | ICD-10-CM | POA: Diagnosis present

## 2021-07-30 LAB — CBC WITH DIFFERENTIAL/PLATELET
Abs Immature Granulocytes: 0.03 10*3/uL (ref 0.00–0.07)
Basophils Absolute: 0.1 10*3/uL (ref 0.0–0.1)
Basophils Relative: 1 %
Eosinophils Absolute: 0.3 10*3/uL (ref 0.0–0.5)
Eosinophils Relative: 4 %
HCT: 43.4 % (ref 36.0–46.0)
Hemoglobin: 14.2 g/dL (ref 12.0–15.0)
Immature Granulocytes: 0 %
Lymphocytes Relative: 27 %
Lymphs Abs: 2 10*3/uL (ref 0.7–4.0)
MCH: 29.9 pg (ref 26.0–34.0)
MCHC: 32.7 g/dL (ref 30.0–36.0)
MCV: 91.4 fL (ref 80.0–100.0)
Monocytes Absolute: 0.9 10*3/uL (ref 0.1–1.0)
Monocytes Relative: 12 %
Neutro Abs: 4.1 10*3/uL (ref 1.7–7.7)
Neutrophils Relative %: 56 %
Platelets: 296 10*3/uL (ref 150–400)
RBC: 4.75 MIL/uL (ref 3.87–5.11)
RDW: 11.8 % (ref 11.5–15.5)
WBC: 7.4 10*3/uL (ref 4.0–10.5)
nRBC: 0 % (ref 0.0–0.2)

## 2021-07-30 LAB — URINALYSIS, ROUTINE W REFLEX MICROSCOPIC
Bacteria, UA: NONE SEEN
Bilirubin Urine: NEGATIVE
Glucose, UA: NEGATIVE mg/dL
Ketones, ur: NEGATIVE mg/dL
Leukocytes,Ua: NEGATIVE
Nitrite: NEGATIVE
Protein, ur: NEGATIVE mg/dL
Specific Gravity, Urine: 1.002 — ABNORMAL LOW (ref 1.005–1.030)
Squamous Epithelial / HPF: NONE SEEN (ref 0–5)
pH: 8 (ref 5.0–8.0)

## 2021-07-30 LAB — COMPREHENSIVE METABOLIC PANEL
ALT: 14 U/L (ref 0–44)
AST: 19 U/L (ref 15–41)
Albumin: 4.3 g/dL (ref 3.5–5.0)
Alkaline Phosphatase: 67 U/L (ref 38–126)
Anion gap: 5 (ref 5–15)
BUN: 13 mg/dL (ref 8–23)
CO2: 28 mmol/L (ref 22–32)
Calcium: 9.5 mg/dL (ref 8.9–10.3)
Chloride: 105 mmol/L (ref 98–111)
Creatinine, Ser: 0.82 mg/dL (ref 0.44–1.00)
GFR, Estimated: >60 mL/min (ref >60)
Glucose, Bld: 120 mg/dL — ABNORMAL HIGH (ref 70–99)
Potassium: 3.8 mmol/L (ref 3.5–5.1)
Sodium: 138 mmol/L (ref 135–145)
Total Bilirubin: 0.5 mg/dL (ref 0.3–1.2)
Total Protein: 7.3 g/dL (ref 6.5–8.1)

## 2021-07-30 LAB — RESP PANEL BY RT-PCR (FLU A&B, COVID) ARPGX2
Influenza A by PCR: NEGATIVE
Influenza B by PCR: NEGATIVE
SARS Coronavirus 2 by RT PCR: NEGATIVE

## 2021-07-30 LAB — TSH: TSH: 9.589 u[IU]/mL — ABNORMAL HIGH (ref 0.350–4.500)

## 2021-07-30 LAB — MAGNESIUM: Magnesium: 2.3 mg/dL (ref 1.7–2.4)

## 2021-07-30 LAB — TROPONIN I (HIGH SENSITIVITY)
Troponin I (High Sensitivity): 8 ng/L (ref <18)
Troponin I (High Sensitivity): 13 ng/L (ref <18)

## 2021-07-30 LAB — T4, FREE: Free T4: 1.14 ng/dL — ABNORMAL HIGH (ref 0.61–1.12)

## 2021-07-30 MED ORDER — VERAPAMIL HCL ER 240 MG PO TBCR
240.0000 mg | EXTENDED_RELEASE_TABLET | Freq: Every day | ORAL | Status: DC
  Administered 2021-07-30 – 2021-07-31 (×2): 240 mg via ORAL
  Filled 2021-07-30 (×4): qty 1

## 2021-07-30 MED ORDER — ENALAPRILAT 1.25 MG/ML IV SOLN
0.6250 mg | INTRAVENOUS | Status: DC | PRN
  Administered 2021-07-30: 0.625 mg via INTRAVENOUS
  Filled 2021-07-30: qty 2
  Filled 2021-07-30: qty 0.5

## 2021-07-30 MED ORDER — APIXABAN 5 MG PO TABS
5.0000 mg | ORAL_TABLET | Freq: Two times a day (BID) | ORAL | Status: DC
  Administered 2021-07-30 – 2021-08-01 (×5): 5 mg via ORAL
  Filled 2021-07-30 (×5): qty 1

## 2021-07-30 MED ORDER — DILTIAZEM HCL-DEXTROSE 125-5 MG/125ML-% IV SOLN (PREMIX)
5.0000 mg/h | INTRAVENOUS | Status: DC
  Administered 2021-07-30: 5 mg/h via INTRAVENOUS
  Filled 2021-07-30: qty 125

## 2021-07-30 MED ORDER — DICYCLOMINE HCL 20 MG PO TABS
10.0000 mg | ORAL_TABLET | Freq: Two times a day (BID) | ORAL | Status: DC
  Administered 2021-07-30 – 2021-08-01 (×5): 10 mg via ORAL
  Filled 2021-07-30 (×7): qty 1

## 2021-07-30 MED ORDER — LEVOTHYROXINE SODIUM 100 MCG PO TABS
100.0000 ug | ORAL_TABLET | Freq: Every day | ORAL | Status: DC
  Administered 2021-07-30 – 2021-08-01 (×3): 100 ug via ORAL
  Filled 2021-07-30: qty 1
  Filled 2021-07-30 (×2): qty 2

## 2021-07-30 MED ORDER — ONDANSETRON HCL 4 MG/2ML IJ SOLN: 4.0000 mg | Freq: Three times a day (TID) | INTRAMUSCULAR | Status: DC | PRN

## 2021-07-30 MED ORDER — ISOSORBIDE MONONITRATE ER 60 MG PO TB24
60.0000 mg | ORAL_TABLET | Freq: Two times a day (BID) | ORAL | Status: DC
  Administered 2021-07-30 – 2021-08-01 (×5): 60 mg via ORAL
  Filled 2021-07-30 (×5): qty 1

## 2021-07-30 MED ORDER — IRBESARTAN 150 MG PO TABS
150.0000 mg | ORAL_TABLET | Freq: Every day | ORAL | Status: DC
  Administered 2021-07-30 – 2021-08-01 (×3): 150 mg via ORAL
  Filled 2021-07-30 (×3): qty 1

## 2021-07-30 MED ORDER — ALPRAZOLAM 0.25 MG PO TABS
0.2500 mg | ORAL_TABLET | Freq: Three times a day (TID) | ORAL | Status: DC
  Administered 2021-07-30 – 2021-08-01 (×4): 0.25 mg via ORAL
  Filled 2021-07-30 (×6): qty 1

## 2021-07-30 MED ORDER — ALBUTEROL SULFATE (2.5 MG/3ML) 0.083% IN NEBU: 2.5000 mg | INHALATION_SOLUTION | RESPIRATORY_TRACT | Status: DC | PRN

## 2021-07-30 MED ORDER — ACETAMINOPHEN 325 MG PO TABS: 650.0000 mg | ORAL_TABLET | Freq: Four times a day (QID) | ORAL | Status: DC | PRN

## 2021-07-30 MED ORDER — SUCRALFATE 1 G PO TABS: 1.0000 g | ORAL_TABLET | Freq: Three times a day (TID) | ORAL | Status: DC

## 2021-07-30 NOTE — Consult Note (Addendum)
CARDIOLOGY CONSULT NOTE               Patient ID: Kristi Barrera MRN: [id number] DOB/AGE: [birth year] 81 y.o.  Admit date: 07/30/2021 Referring Physician Dr. Clyde Lundborg Primary Physician Dr. Judithann Sheen Primary Cardiologist Dr. Lady Gary Reason for Consultation AF with RVR  HPI: Pt is a 81yoF with a PMH significant for paroxysmal atrial fibrillation on eliquis, severe mitral regurgitation by TTE 10/02/20, hypertension, hyperlipidemia, hypothyroidism, GERD, anxiety who presented to [facility] ED from home with palpitations and was found to be in AF with RVR. She converted to NSR around 0630 after 1 hour of diltiazem gtt.   The patient is examined in the emergency department sitting upright, her son and daughter-in-law at bedside.  The patient states she was in her usual state of health before she woke up at 1 AM and had a weird choking feeling and took a Tums and went back to sleep.  She woke up again around 3 AM with the same choking feeling and felt her heart racing out of control and she had her husband call EMS.   EMS reports her HR was in the 180s in AF, was given 5mg  IV metop by EMS decreased to the 120s by the time she was seen in the ED. She was on a diltiazem drip for just over an hour this morning and converted to NSR.  Notably, her blood pressure has been significantly elevated since her presentation, during interview it is measured at 217/80 however her home blood pressure meds had not been administered yet.  During interview she states she just feels tired, denies chest pain, and states she feels an occasional flutter in her chest here and there but is overall improved from earlier this morning.  She reports generalized fatigue x 2 months and some SOB which is unchanged that she attributes to a sinus infection in December.  The patient states her blood pressure is "always high" and she tends to worry about it. Her PCP Dr. Judithann Sheen have recently made some changes to her blood pressure regimen  without much success.  Her home regimen includes valsartan 160 mg by mouth twice daily, verapamil 180 mg once daily (was increased from 120mg  once daily on 12/29, but patient was apparently did not make this change until after her more recent office visit on 1/5 where it was reiterated).  She denies history of imaging of her kidneys to assess for causes of secondary hypertension.  Of note, she reports various intolerances to multiple antihypertensives including clonidine, hydralazine, metoprolol, and chlorthalidone.  Labs at admission are significant for a creatinine of 0.82, EGFR greater than 60.  Troponins flat 8-13.  TSH is 9.5, T4 1.14.   Review of systems complete and found to be negative unless listed above   Past Medical History:  Diagnosis Date   Abnormal cardiac valve    LEAKING HEART VALVE   Anxiety    Arthritis    Chest pain    Chronic low back pain    Colitis, chronic, ulcerative (HCC)    Dysrhythmia    GERD (gastroesophageal reflux disease)    HTN (hypertension)    Hyperlipidemia    Hypothyroidism    Low blood potassium    Palpitations    Recurrent UTI    Sarcoidosis    [facility]---[facility] 15-110yrs ago   Sinusitis    Skin cancer    Sleep apnea    cpap with 2 l o2   Thyroid disease    Vitamin D  deficiency     Past Surgical History:  Procedure Laterality Date   BREAST CYST ASPIRATION Left    neg   BREAST CYST ASPIRATION Right 09/25/2019   CATARACT EXTRACTION W/PHACO Right 04/06/2017   Procedure: CATARACT EXTRACTION PHACO AND INTRAOCULAR LENS PLACEMENT (IOC);  Surgeon: Galen Manila, MD;  Location: [facility] ORS;  Service: Ophthalmology;  Laterality: Right;  [place] 00:36.1 AP% 18.5 CDE 6.66 fluid Pack lot # [id number]   COLONOSCOPY WITH PROPOFOL N/A 08/06/2015   Procedure: COLONOSCOPY WITH PROPOFOL;  Surgeon: Scot Jun, MD;  Location: [facility] ENDOSCOPY;  Service: Endoscopy;  Laterality: N/A;   COLONOSCOPY WITH PROPOFOL N/A 01/15/2016   Procedure: COLONOSCOPY WITH  PROPOFOL;  Surgeon: Scot Jun, MD;  Location: [facility] ENDOSCOPY;  Service: Endoscopy;  Laterality: N/A;   Urinary procedure      (Not in a hospital admission)  Social History   Socioeconomic History   Marital status: Married    Spouse name: Not on file   Number of children: Not on file   Years of education: Not on file   Highest education level: Not on file  Occupational History   Occupation: retired  Tobacco Use   Smoking status: Never   Smokeless tobacco: Never  Substance and Sexual Activity   Alcohol use: No   Drug use: No   Sexual activity: Not on file  Other Topics Concern   Not on file  Social History Narrative   Not on file   Social Determinants of Health   Financial Resource Strain: Not on file  Food Insecurity: Not on file  Transportation Needs: Not on file  Physical Activity: Not on file  Stress: Not on file  Social Connections: Not on file  Intimate Partner Violence: Not on file    Family History  Problem Relation Age of Onset   Heart disease Father        MI   Stroke Father    Pneumonia Father    Hypertension Father    Stroke Mother    Rheum arthritis Mother    Breast cancer Neg Hx       Review of systems complete and found to be negative unless listed above    PHYSICAL EXAM General: Pleasant elderly Caucasian female, well nourished, in no acute distress.  Sitting upright in ED bed with son and daughter-in-law at bedside HEENT:  Normocephalic and atraumatic. Neck:  No JVD.  Lungs: Normal respiratory effort on room air. Clear bilaterally to auscultation. No wheezes, crackles, rhonchi.  Heart: HRRR . Normal S1 and S2 without gallops or murmurs. Radial & DP pulses 2+ bilaterally. Abdomen: Non-distended appearing.  Msk: Normal strength and tone for age. Extremities: No clubbing, cyanosis or edema.   Neuro: Alert and oriented X 3. Psych:  Mood appropriate, affect congruent.   Labs:   Lab Results  Component Value Date   WBC 7.4 07/30/2021    HGB 14.2 07/30/2021   HCT 43.4 07/30/2021   MCV 91.4 07/30/2021   PLT 296 07/30/2021    Recent Labs  Lab 07/30/21 0430  NA 138  K 3.8  CL 105  CO2 28  BUN 13  CREATININE 0.82  CALCIUM 9.5  PROT 7.3  BILITOT 0.5  ALKPHOS 67  ALT 14  AST 19  GLUCOSE 120*   Lab Results  Component Value Date   CKTOTAL 84 07/10/2013   CKMB < 0.5 (L) 07/10/2013   TROPONINI <0.03 04/06/2018   No results found for: CHOL No results found for: HDL No  results found for: LDLCALC No results found for: TRIG No results found for: CHOLHDL No results found for: LDLDIRECT    Radiology: DG Chest Port 1 View  Result Date: 07/30/2021 CLINICAL DATA:  Heart palpitations and a lump in the throat, with shortness of breath. Tachycardia. EXAM: PORTABLE CHEST 1 VIEW COMPARISON:  Portable chest 07/20/2021 FINDINGS: There is mild cardiomegaly, normal caliber central vessels. The lungs demonstrate fibrosis within upper zonal predominance. No focal pneumonia is seen. The sulci are sharp. Calcification again noted transverse aorta and slight aortic tortuosity. Stable mediastinum. Osteopenia. IMPRESSION: No active disease.  Stable chest with cardiomegaly and fibrosis. Electronically Signed   By: Almira Bar M.D.   On: 07/30/2021 05:04   DG Chest Port 1 View  Result Date: 07/20/2021 CLINICAL DATA:  Chest pain. EXAM: PORTABLE CHEST 1 VIEW COMPARISON:  April 17, 2019. FINDINGS: Stable cardiomediastinal silhouette. Both lungs are clear. The visualized skeletal structures are unremarkable. IMPRESSION: No active disease. Electronically Signed   By: Lupita Raider M.D.   On: 07/20/2021 14:06    ECHO 10/02/20 DOPPLER ECHO and OTHER SPECIAL PROCEDURES                 Aortic: MILD AR                    No AS                         176.5 cm/sec peak vel      12.5 mmHg peak grad                 Mitral: SEVERE MR                  No MS                         MV Inflow E Vel = 135.0 cm/sec      MV Annulus E'Vel = 6.5  cm/sec                         E/E'Ratio = 20.8              Tricuspid: MODERATE TR                No TS                         352.9 cm/sec peak TR vel   59.8 mmHg peak RV pressure              Pulmonary: No PR                      No PS                         85.2 cm/sec peak vel       2.9 mmHg peak grad  __________________________________________________________________________ INTERPRETATION  NORMAL LEFT VENTRICULAR SYSTOLIC FUNCTION  NORMAL RIGHT VENTRICULAR SYSTOLIC FUNCTION  SEVERE VALVULAR REGURGITATION (See above)  NO VALVULAR STENOSIS  MODERATE TR  SEVERE MR  MILD AR  EF 50%   TELEMETRY reviewed by me: Normal sinus rhythm with rate of 66 bpm  EKG reviewed by me: NSR rate 69, LBBB (unchanged form previous 07/20/21 and 04/06/18  ASSESSMENT AND PLAN:  80yoF with a PMH significant for paroxysmal  atrial fibrillation on eliquis, severe mitral regurgitation by TTE 10/02/20, hypertension, hyperlipidemia, hypothyroidism, GERD, anxiety who presented to [facility] ED from home with palpitations and was found to be in AF with RVR. She converted to NSR around 0630 after 1 hour of diltiazem gtt.   #Atrial fibrillation with RVR #Severe MR by TTE 10/02/2020 Presented with palpitations and a feeling of fullness in her throat.  -converted to NSR s/p metoprolol bolus and 1 hour of diltiazem gtt around 0630. -CHA2DS2-VASc 4.  Reports compliance with Eliquis since her initial atrial fibrillation diagnosis 11/2020 -Continue verapamil at increased dose 240 mg for now for rate control. Home dose was 180mg  per PCP on 1/5.  -Continue Eliquis for stroke risk reduction -Ordered repeat echocardiogram for assessment of heart function and monitoring of her known severe MR  #Hypertension Long standing h/o severe HTN per patient report with multiple drug allergies. States that at home her systolic BP is often >190. Never had secondary workuu.  -Ordered bilateral renal ultrasound to assess for potential causes of  secondary hypertension.  Creatinine and GFR are at 0.82 and greater than 60 respectively. -Continue irbesartan 150 once daily. Home med is valsartan 160mg  PO BID.  - Continue verapamil -consider initiation of spironolactone pending her blood pressure trends after she receives her home medications  #Hyperlipidemia -LDL was 144, total cholesterol 220 at recent outpatient visit 12/29, recommend high intensity statin therapy.  It appears she is currently only on diet therapy per her last PCP note.  #Hypothyroidism #GERD Management per primary team  This patient's plan of care was discussed and created with Dr. Sena Slate and he is in agreement.  Signed: Rebeca Allegra , PA-C 07/30/2021, 11:22 AM   The patient was seen and examined with Jodie Echevaria, PA-C, and we formulated the plan together. I agree with the assessment and plan as documented above.   Armando Reichert, MD

## 2021-07-30 NOTE — ED Notes (Signed)
Purwick removed.

## 2021-07-30 NOTE — ED Notes (Signed)
Pt converted to sinus rhythm, HR 70, EDP notified, new EKG obtained and dilt drip titrated down to 5mg /hr.

## 2021-07-30 NOTE — ED Notes (Signed)
Pt is reporting episodes of feeling like "her heart is skipping a beat" and "a choking feeling" like when her heart rate was elevated.  Sts the episodes only last a few seconds.

## 2021-07-30 NOTE — ED Notes (Signed)
Purewick placed at request of the patient.

## 2021-07-30 NOTE — ED Notes (Signed)
Dietary notified of diet order.  Tray is being made.

## 2021-07-30 NOTE — ED Provider Notes (Addendum)
[facility] Provider Note    Event Date/Time   First MD Initiated Contact with Patient 07/30/21 [phone]     (approximate)   History   Atrial Fibrillation   HPI  Kristi Barrera is a 81 y.o. female brought to the ED via EMS from home with a chief complaint of palpitations, chest tightness and shortness of breath.  Patient has a history of atrial fibrillation, on Eliquis.  Takes verapamil.  EMS found patient to be in A. fib with RVR rate 180s.  EMS gave 5 mg IV metoprolol in total prior to arrival.  Patient arrives with heart rate in the 120s.  Denies fever, cough, abdominal pain, nausea, vomiting or dizziness.     Past Medical History   Past Medical History:  Diagnosis Date   Abnormal cardiac valve    LEAKING HEART VALVE   Anxiety    Arthritis    Chest pain    Chronic low back pain    Colitis, chronic, ulcerative (HCC)    Dysrhythmia    GERD (gastroesophageal reflux disease)    HTN (hypertension)    Hyperlipidemia    Hypothyroidism    Low blood potassium    Palpitations    Recurrent UTI    Sarcoidosis    [facility]---[facility] 15-21yrs ago   Sinusitis    Skin cancer    Sleep apnea    cpap with 2 l o2   Thyroid disease    Vitamin D deficiency      Active Problem List   Patient Active Problem List   Diagnosis Date Noted   Rapid atrial fibrillation ([place]) 07/30/2021   Left bundle branch block 04/17/2019   GI bleed 08/02/2015   Nocturnal hypoxemia 02/14/2013   OSA (obstructive sleep apnea) 02/29/2012   PAH (pulmonary artery hypertension) ([place]) 11/17/2011   Sarcoidosis ([place]) 11/17/2011     Past Surgical History   Past Surgical History:  Procedure Laterality Date   BREAST CYST ASPIRATION Left    neg   BREAST CYST ASPIRATION Right 09/25/2019   CATARACT EXTRACTION W/PHACO Right 04/06/2017   Procedure: CATARACT EXTRACTION PHACO AND INTRAOCULAR LENS PLACEMENT ([place]);  Surgeon: Birder Robson, MD;  Location: [facility] ORS;  Service:  Ophthalmology;  Laterality: Right;  [place] 00:36.1 AP% 18.5 CDE 6.66 fluid Pack lot # [id number]   COLONOSCOPY WITH PROPOFOL N/A 08/06/2015   Procedure: COLONOSCOPY WITH PROPOFOL;  Surgeon: Manya Silvas, MD;  Location: [facility] ENDOSCOPY;  Service: Endoscopy;  Laterality: N/A;   COLONOSCOPY WITH PROPOFOL N/A 01/15/2016   Procedure: COLONOSCOPY WITH PROPOFOL;  Surgeon: Manya Silvas, MD;  Location: [facility] ENDOSCOPY;  Service: Endoscopy;  Laterality: N/A;   Urinary procedure       Home Medications   Prior to Admission medications   Medication Sig Start Date End Date Taking? Authorizing Provider  acetaminophen (TYLENOL) 500 MG tablet Take 2 tablets (1,000 mg total) by mouth every 6 (six) hours as needed for moderate pain. 07/20/21 07/20/22  Duffy Bruce, MD  ALPRAZolam Duanne Moron) 0.25 MG tablet Take 0.25 mg by mouth 3 (three) times daily.     [provider]  amLODipine (NORVASC) 5 MG tablet Take 5 mg by mouth daily.    [provider]  benazepril (LOTENSIN) 40 MG tablet Take 40 mg by mouth daily.    [provider]  esomeprazole (NEXIUM) 40 MG capsule Take 1 capsule (40 mg total) by mouth daily at 12 noon for 14 days. 07/20/21 08/03/21  Duffy Bruce, MD  fenofibrate micronized (LOFIBRA) 134 MG capsule Take 134 mg by mouth Daily.  11/03/11   [provider]  levothyroxine (SYNTHROID, LEVOTHROID) 75 MCG tablet Take 75 mcg by mouth daily before breakfast.    [provider]  olmesartan (BENICAR) 40 MG tablet Take 40 mg by mouth daily.    [provider]  ranitidine (ZANTAC) 150 MG tablet Take 150 mg by mouth 2 (two) times daily.    [provider]  sucralfate (CARAFATE) 1 g tablet Take 1 tablet (1 g total) by mouth 4 (four) times daily -  with meals and at bedtime for 7 days. 07/20/21 07/27/21  Duffy Bruce, MD  verapamil (CALAN-SR) 240 MG CR tablet Take 240 mg by mouth at bedtime.     [provider]     Allergies  Iohexol,  Clonidine derivatives, Floxin [ofloxacin], Hydralazine hcl, Lotrel [amlodipine besy-benazepril hcl], Potassium-containing compounds, Protonix [pantoprazole sodium], Sulfa antibiotics, and Toprol xl [metoprolol succinate]   Family History   Family History  Problem Relation Age of Onset   Heart disease Father        MI   Stroke Father    Pneumonia Father    Hypertension Father    Stroke Mother    Rheum arthritis Mother    Breast cancer Neg Hx      Physical Exam  Triage Vital Signs: ED Triage Vitals  Enc Vitals Group     BP --      Pulse --      Resp --      Temp --      Temp src --      SpO2 --      Weight 07/30/21 0428 149 lb (67.6 kg)     Height 07/30/21 0428 5\' 2"  (1.575 m)     Head Circumference --      Peak Flow --      Pain Score 07/30/21 0426 0     Pain Loc --      Pain Edu? --      Excl. in [place]? --     Updated Vital Signs: BP (!) 156/97    Pulse (!) 139    Temp (!) 97.4 F (36.3 C) (Oral)    Resp 15    Ht 5\' 2"  (1.575 m)    Wt 67.6 kg    SpO2 97%    BMI 27.25 kg/m    General: Awake, elderly in mild distress.  CV:  Irregularly irregular tachycardic.  Good peripheral perfusion.  Resp:  Increased effort.  CTA B. Abd:  No distention.  Other:     ED Results / Procedures / Treatments  Labs (all labs ordered are listed, but only abnormal results are displayed) Labs Reviewed  COMPREHENSIVE METABOLIC PANEL - Abnormal; Notable for the following components:      Result Value   Glucose, Bld 120 (*)    All other components within normal limits  TSH - Abnormal; Notable for the following components:   TSH 9.589 (*)    All other components within normal limits  T4, FREE - Abnormal; Notable for the following components:   Free T4 1.14 (*)    All other components within normal limits  URINALYSIS, ROUTINE W REFLEX MICROSCOPIC - Abnormal; Notable for the following components:   Color, Urine COLORLESS (*)    APPearance CLEAR (*)    Specific Gravity, Urine 1.002 (*)     Hgb urine dipstick SMALL (*)    All other components within normal  limits  RESP PANEL BY RT-PCR (FLU A&B, COVID) [id number]  CBC WITH DIFFERENTIAL/PLATELET  TROPONIN I (HIGH SENSITIVITY)  TROPONIN I (HIGH SENSITIVITY)     EKG  ED ECG REPORT I, Sophia Sperry J, the attending physician, reviewed and interpreted this ECG.   Date: 07/30/2021  EKG Time: 0428  Rate: 113  Rhythm: atrial fibrillation, rate 113  Axis: LAD  Intervals:left bundle branch block  ST&T Change: Nonspecific    RADIOLOGY I have personally reviewed patient's portable chest x-ray as well as the radiology interpretation:  Chest x-ray: No acute cardio pulmonary disease  Official radiology report(s): DG Chest Port 1 View  Result Date: 07/30/2021 CLINICAL DATA:  Heart palpitations and a lump in the throat, with shortness of breath. Tachycardia. EXAM: PORTABLE CHEST 1 VIEW COMPARISON:  Portable chest 07/20/2021 FINDINGS: There is mild cardiomegaly, normal caliber central vessels. The lungs demonstrate fibrosis within upper zonal predominance. No focal pneumonia is seen. The sulci are sharp. Calcification again noted transverse aorta and slight aortic tortuosity. Stable mediastinum. Osteopenia. IMPRESSION: No active disease.  Stable chest with cardiomegaly and fibrosis. Electronically Signed   By: Telford Nab M.D.   On: 07/30/2021 05:04     PROCEDURES:  Critical Care performed: Yes, see critical care procedure note(s)  .1-3 Lead EKG Interpretation Performed by: Paulette Blanch, MD Authorized by: Paulette Blanch, MD     Interpretation: abnormal     ECG rate:  119   ECG rate assessment: tachycardic     Rhythm: atrial fibrillation     Ectopy: none     Conduction: normal   Comments:     Patient placed on cardiac monitor to evaluate for arrhythmias  CRITICAL CARE Performed by: Paulette Blanch   Total critical care time: 30 minutes  Critical care time was exclusive of separately billable procedures and treating other  patients.  Critical care was necessary to treat or prevent imminent or life-threatening deterioration.  Critical care was time spent personally by me on the following activities: development of treatment plan with patient and/or surrogate as well as nursing, discussions with consultants, evaluation of patient's response to treatment, examination of patient, obtaining history from patient or surrogate, ordering and performing treatments and interventions, ordering and review of laboratory studies, ordering and review of radiographic studies, pulse oximetry and re-evaluation of patient's condition.    MEDICATIONS ORDERED IN ED: Medications  diltiazem (CARDIZEM) 125 mg in dextrose 5% 125 mL (1 mg/mL) infusion (10 mg/hr Intravenous Rate/Dose Change 07/30/21 0634)     IMPRESSION / MDM / ASSESSMENT AND PLAN / ED COURSE  I reviewed the triage vital signs and the nursing notes.                             81 year old female presenting with chest tightness, shortness of breath; found to be in atrial fibrillation with RVR. Differential includes, but is not limited to, viral syndrome, bronchitis including COPD exacerbation, pneumonia, reactive airway disease including asthma, CHF including exacerbation with or without pulmonary/interstitial edema, pneumothorax, ACS, thoracic trauma, and pulmonary embolism.   Will obtain cardiac panel, chest x-ray, respiratory panel.  I have personally reviewed patient's PCP office visit from 07/24/2021; note verapamil dosage increased for hypertension.  The patient is on the cardiac monitor to evaluate for evidence of arrhythmia and/or significant heart rate changes.   Clinical Course as of 07/30/21 0700  Wed Jul 30, 2021  0533 Rate 125-130; Cardizem drip started.  Rate currently  140s, will titrate.  Updated patient and spouse on all test results.  Will consult and discussed with hospitalist services for admission. [JS]  [id number] Patient now normal sinus rhythm rate of 69.   Will titrate diltiazem drip down to 5 mg. [JS]    Clinical Course User Index [JS] Paulette Blanch, MD     FINAL CLINICAL IMPRESSION(S) / ED DIAGNOSES   Final diagnoses:  Atrial fibrillation with rapid ventricular response ([place])  Chest pain, unspecified type  Shortness of breath     Rx / DC Orders   ED Discharge Orders     None        Note:  This document was prepared using Dragon voice recognition software and may include unintentional dictation errors.   Paulette Blanch, MD 07/30/21 0518    Paulette Blanch, MD 07/30/21 0700

## 2021-07-30 NOTE — ED Notes (Signed)
Pt's HR dropped to 55 x 1 hr.  Cardizem stopped.

## 2021-07-30 NOTE — H&P (Signed)
History and Physical    Kristi Barrera VWU:[id number] DOB: 11-04-1940 DOA: 07/30/2021  Referring MD/NP/PA:   PCP: Marguarite Arbour, MD   Patient coming from:  The patient is coming from home.  At baseline, pt is independent for most of ADL.        Chief Complaint: Palpitation  HPI: Kristi Barrera is a 81 y.o. female with medical history significant of Eliquis on Xarelto, hypertension, hyperlipidemia, OSA on CPAP with 2 L oxygen at night, GERD, hypothyroidism, anxiety, sarcoidosis, chronic ulcerative colitis, PAH, GI bleeding, who presents with palpitation.  Pt states that her symptoms started in the early morning at about 2:45 PM. She reports palpitation and heart racing, associated with mild shortness breath, no chest pain, fever or chills. She took a tums without relief at home.  Per EMS report, patient was found to have A fib with RVR with heart rate up to 180s. Pt  was given total 5mg  metoprolol. HR improved to 130s. Cardizem gtt was started in ED, HR improved to 50-60s.  Patient has nausea, no vomiting, diarrhea or abdominal pain.  No symptoms of UTI.  She states that she uses CPAP with 2 L oxygen at night.   ED Course: pt was found to have WBC 7.4, negative COVID PCR, troponin level 8, 13, TSH 9.589 and free T4 1.14, negative urinalysis, electrolytes renal function okay, temperature normal, blood pressure 169/68, RR 25, oxygen sat 99% on room air.  Chest x-ray negative.  Patient is admitted to progressive bed by accepting MD.  Dr. Beatrix Fetters of cardiology is consulted  Review of Systems:   General: no fevers, chills, no body weight gain, has fatigue HEENT: no blurry vision, hearing changes or sore throat Respiratory: has dyspnea, no coughing, wheezing CV: no chest pain, has palpitations GI: has nausea, no vomiting, abdominal pain, diarrhea, constipation GU: no dysuria, burning on urination, increased urinary frequency, hematuria  Ext: no leg edema Neuro: no unilateral  weakness, numbness, or tingling, no vision change or hearing loss Skin: no rash, no skin tear. MSK: No muscle spasm, no deformity, no limitation of range of movement in spin Heme: No easy bruising.  Travel history: No recent long distant travel.  Allergy:  Allergies  Allergen Reactions   Iohexol Shortness Of Breath   Clonidine Derivatives     Swelling, chest tightness   Floxin [Ofloxacin] Other (See Comments)    Hallucinations    Hydralazine Hcl     BP dropped, HR increased, weakness, nausea, syncope   Lotrel [Amlodipine Besy-Benazepril Hcl] Itching   Potassium-Containing Compounds Other (See Comments)    Patient states she is allergic to oral Potassium Chloride only, had chest pains and a slight rash    Protonix [Pantoprazole Sodium] Swelling   Sulfa Antibiotics    Toprol Xl [Metoprolol Succinate]     fatigue    Past Medical History:  Diagnosis Date   Abnormal cardiac valve    LEAKING HEART VALVE   Anxiety    Arthritis    Chest pain    Chronic low back pain    Colitis, chronic, ulcerative (HCC)    Dysrhythmia    GERD (gastroesophageal reflux disease)    HTN (hypertension)    Hyperlipidemia    Hypothyroidism    Low blood potassium    Palpitations    Recurrent UTI    Sarcoidosis    [place] 15-75yrs ago   Sinusitis    Skin cancer    Sleep apnea    cpap  with 2 l o2   Thyroid disease    Vitamin D deficiency     Past Surgical History:  Procedure Laterality Date   BREAST CYST ASPIRATION Left    neg   BREAST CYST ASPIRATION Right 09/25/2019   CATARACT EXTRACTION W/PHACO Right 04/06/2017   Procedure: CATARACT EXTRACTION PHACO AND INTRAOCULAR LENS PLACEMENT (IOC);  Surgeon: Galen Manila, MD;  Location: [facility] ORS;  Service: Ophthalmology;  Laterality: Right;  [place] 00:36.1 AP% 18.5 CDE 6.66 fluid Pack lot # [id number]   COLONOSCOPY WITH PROPOFOL N/A 08/06/2015   Procedure: COLONOSCOPY WITH PROPOFOL;  Surgeon: Scot Jun, MD;  Location: [facility]  ENDOSCOPY;  Service: Endoscopy;  Laterality: N/A;   COLONOSCOPY WITH PROPOFOL N/A 01/15/2016   Procedure: COLONOSCOPY WITH PROPOFOL;  Surgeon: Scot Jun, MD;  Location: [facility] ENDOSCOPY;  Service: Endoscopy;  Laterality: N/A;   Urinary procedure      Social History:  reports that she has never smoked. She has never used smokeless tobacco. She reports that she does not drink alcohol and does not use drugs.  Family History:  Family History  Problem Relation Age of Onset   Heart disease Father        MI   Stroke Father    Pneumonia Father    Hypertension Father    Stroke Mother    Rheum arthritis Mother    Breast cancer Neg Hx      Prior to Admission medications   Medication Sig Start Date End Date Taking? Authorizing Provider  acetaminophen (TYLENOL) 500 MG tablet Take 2 tablets (1,000 mg total) by mouth every 6 (six) hours as needed for moderate pain. 07/20/21 07/20/22  Shaune Pollack, MD  ALPRAZolam Prudy Feeler) 0.25 MG tablet Take 0.25 mg by mouth 3 (three) times daily.     [provider]  amLODipine (NORVASC) 5 MG tablet Take 5 mg by mouth daily.    [provider]  benazepril (LOTENSIN) 40 MG tablet Take 40 mg by mouth daily.    [provider]  esomeprazole (NEXIUM) 40 MG capsule Take 1 capsule (40 mg total) by mouth daily at 12 noon for 14 days. 07/20/21 08/03/21  Shaune Pollack, MD  fenofibrate micronized (LOFIBRA) 134 MG capsule Take 134 mg by mouth Daily.  11/03/11   [provider]  levothyroxine (SYNTHROID, LEVOTHROID) 75 MCG tablet Take 75 mcg by mouth daily before breakfast.    [provider]  olmesartan (BENICAR) 40 MG tablet Take 40 mg by mouth daily.    [provider]  ranitidine (ZANTAC) 150 MG tablet Take 150 mg by mouth 2 (two) times daily.    [provider]  sucralfate (CARAFATE) 1 g tablet Take 1 tablet (1 g total) by mouth 4 (four) times daily -  with meals and at bedtime for 7 days. 07/20/21 07/27/21   Shaune Pollack, MD  verapamil (CALAN-SR) 240 MG CR tablet Take 240 mg by mouth at bedtime.     [provider]    Physical Exam: Vitals:   07/30/21 1130 07/30/21 1145 07/30/21 1200 07/30/21 1215  BP: (!) 178/64 (!) 197/72 (!) 217/80 (!) 215/73  Pulse: (!) 49 61 62 63  Resp: 19 11 10    Temp:      TempSrc:      SpO2: 99% 99% 99% 100%  Weight:      Height:       General: Not in acute distress HEENT:       Eyes: PERRL, EOMI, no scleral icterus.  ENT: No discharge from the ears and nose, no pharynx injection, no tonsillar enlargement.        Neck: No JVD, no bruit, no mass felt. Heme: No neck lymph node enlargement. Cardiac: S1/S2, RRR, No murmurs, No gallops or rubs. Respiratory: No rales, wheezing, rhonchi or rubs. GI: Soft, nondistended, nontender, no rebound pain, no organomegaly, BS present. GU: No hematuria Ext: No pitting leg edema bilaterally. 1+DP/PT pulse bilaterally. Musculoskeletal: No joint deformities, No joint redness or warmth, no limitation of ROM in spin. Skin: No rashes. Has bruise in right foot. Neuro: Alert, oriented X3, cranial nerves II-XII grossly intact, moves all extremities normally. Psych: Patient is not psychotic, no suicidal or hemocidal ideation.  Labs on Admission: I have personally reviewed following labs and imaging studies  CBC: Recent Labs  Lab 07/30/21 0430  WBC 7.4  NEUTROABS 4.1  HGB 14.2  HCT 43.4  MCV 91.4  PLT 296   Basic Metabolic Panel: Recent Labs  Lab 07/30/21 0430  NA 138  K 3.8  CL 105  CO2 28  GLUCOSE 120*  BUN 13  CREATININE 0.82  CALCIUM 9.5  MG 2.3   GFR: Estimated Creatinine Clearance: 51 mL/min (by C-G formula based on SCr of 0.82 mg/dL). Liver Function Tests: Recent Labs  Lab 07/30/21 0430  AST 19  ALT 14  ALKPHOS 67  BILITOT 0.5  PROT 7.3  ALBUMIN 4.3   No results for input(s): LIPASE, AMYLASE in the last 168 hours. No results for input(s): AMMONIA in the last 168  hours. Coagulation Profile: No results for input(s): INR, PROTIME in the last 168 hours. Cardiac Enzymes: No results for input(s): CKTOTAL, CKMB, CKMBINDEX, TROPONINI in the last 168 hours. BNP (last 3 results) No results for input(s): PROBNP in the last 8760 hours. HbA1C: No results for input(s): HGBA1C in the last 72 hours. CBG: No results for input(s): GLUCAP in the last 168 hours. Lipid Profile: No results for input(s): CHOL, HDL, LDLCALC, TRIG, CHOLHDL, LDLDIRECT in the last 72 hours. Thyroid Function Tests: Recent Labs    07/30/21 0430  TSH 9.589*  FREET4 1.14*   Anemia Panel: No results for input(s): VITAMINB12, FOLATE, FERRITIN, TIBC, IRON, RETICCTPCT in the last 72 hours. Urine analysis:    Component Value Date/Time   COLORURINE COLORLESS (A) 07/30/2021 0529   APPEARANCEUR CLEAR (A) 07/30/2021 0529   LABSPEC 1.002 (L) 07/30/2021 0529   PHURINE 8.0 07/30/2021 0529   GLUCOSEU NEGATIVE 07/30/2021 0529   HGBUR SMALL (A) 07/30/2021 0529   BILIRUBINUR NEGATIVE 07/30/2021 0529   KETONESUR NEGATIVE 07/30/2021 0529   PROTEINUR NEGATIVE 07/30/2021 0529   NITRITE NEGATIVE 07/30/2021 0529   LEUKOCYTESUR NEGATIVE 07/30/2021 0529   Sepsis Labs: @LABRCNTIP (procalcitonin:4,lacticidven:4) ) Recent Results (from the past 240 hour(s))  Resp Panel by RT-PCR (Flu A&B, Covid) Nasopharyngeal Swab     Status: None   Collection Time: 07/30/21  4:30 AM   Specimen: Nasopharyngeal Swab; Nasopharyngeal(NP) swabs in vial transport medium  Result Value Ref Range Status   SARS Coronavirus 2 by RT PCR NEGATIVE NEGATIVE Final    Comment: (NOTE) SARS-CoV-2 target nucleic acids are NOT DETECTED.  The SARS-CoV-2 RNA is generally detectable in upper respiratory specimens during the acute phase of infection. The lowest concentration of SARS-CoV-2 viral copies this assay can detect is 138 copies/mL. A negative result does not preclude SARS-Cov-2 infection and should not be used as the sole  basis for treatment or other patient management decisions. A negative result may occur with  improper  specimen collection/handling, submission of specimen other than nasopharyngeal swab, presence of viral mutation(s) within the areas targeted by this assay, and inadequate number of viral copies(<138 copies/mL). A negative result must be combined with clinical observations, patient history, and epidemiological information. The expected result is Negative.  Fact Sheet for Patients:  BloggerCourse.com  Fact Sheet for Healthcare Providers:  SeriousBroker.it  This test is no t yet approved or cleared by the [place] FDA and  has been authorized for detection and/or diagnosis of SARS-CoV-2 by FDA under an Emergency Use Authorization (EUA). This EUA will remain  in effect (meaning this test can be used) for the duration of the COVID-19 declaration under Section 564(b)(1) of the Act, 21 U.S.C.section 360bbb-3(b)(1), unless the authorization is terminated  or revoked sooner.       Influenza A by PCR NEGATIVE NEGATIVE Final   Influenza B by PCR NEGATIVE NEGATIVE Final    Comment: (NOTE) The Xpert Xpress SARS-CoV-2/FLU/RSV plus assay is intended as an aid in the diagnosis of influenza from Nasopharyngeal swab specimens and should not be used as a sole basis for treatment. Nasal washings and aspirates are unacceptable for Xpert Xpress SARS-CoV-2/FLU/RSV testing.  Fact Sheet for Patients: BloggerCourse.com  Fact Sheet for Healthcare Providers: SeriousBroker.it  This test is not yet approved or cleared by the [place] FDA and has been authorized for detection and/or diagnosis of SARS-CoV-2 by FDA under an Emergency Use Authorization (EUA). This EUA will remain in effect (meaning this test can be used) for the duration of the COVID-19 declaration under Section 564(b)(1) of the Act,  21 U.S.C. section 360bbb-3(b)(1), unless the authorization is terminated or revoked.  Performed at [facility], [address] [place], [place] [address]      Radiological Exams on Admission: DG Chest [facility] 1 View  Result Date: 07/30/2021 CLINICAL DATA:  Heart palpitations and a lump in the throat, with shortness of breath. Tachycardia. EXAM: PORTABLE CHEST 1 VIEW COMPARISON:  Portable chest 07/20/2021 FINDINGS: There is mild cardiomegaly, normal caliber central vessels. The lungs demonstrate fibrosis within upper zonal predominance. No focal pneumonia is seen. The sulci are sharp. Calcification again noted transverse aorta and slight aortic tortuosity. Stable mediastinum. Osteopenia. IMPRESSION: No active disease.  Stable chest with cardiomegaly and fibrosis. Electronically Signed   By: Almira Bar M.D.   On: 07/30/2021 05:04     EKG: I have personally reviewed.  Sinus rhythm, QTC 462, old left bundle blockage, poor R wave progression  Assessment/Plan Principal Problem:   Atrial fibrillation with RVR (HCC) Active Problems:   Sarcoidosis (HCC)   Hyperlipidemia   Hypothyroidism   Anxiety   Atrial fibrillation with RVR (HCC): CHA2DS2-VASc 4. Converted to NSR after treated with metoprolol bolus and 1 hour of diltiazem gtt.  -pt is admitted to progressive bed by accepting MD -continue Eliquis, home verpamil 240 mg daily -2d echo per card  HTN and hypertensive urgency: SBP is up to 210 mmHg in ED. -As needed IV enalaprilate  --Continue irbesartan 150 once daily ( Home med is valsartan 160mg  PO BID) - imdur 60 mg bid -Verapamil -f/u US-renal artery duplex per card  Sarcoidosis (HCC): stable. Calcium 9.5 -no acute issues  Hyperlipidemia: not taking meds now -f/u with PCP  Hypothyroidism: TSH 9.589 , but free T4 is also high at 1.14.  -Continue home Synthroid 100 mcg daily -will check free T3  Anxiety -Continue home Xanax       DVT ppx: on  Eliquuis Code Status: Full  code Family Communication: Yes, patient's husband   at bed side.    Disposition Plan:  Anticipate discharge back to previous environment Consults called:  Dr. Beatrix Fetters of card Admission status and Level of care: Progressive:   as inpt        Status is: Inpatient  Remains inpatient appropriate because: Patient has multiple comorbidities, now presents with atrial fibrillation with RVR, with heart rate up to 180s.  Her presentation is highly complicated.  Patient at high risk of deteriorating.  Need to be treated in hospital for at least 2 days.          Date of Service 07/30/2021    Lorretta Harp [facility]   If 7PM-7AM, please contact night-coverage [email or web address] 07/30/2021, 12:21 PM

## 2021-07-30 NOTE — ED Notes (Signed)
Patient is awake and resting comfortably. NSR. HR 67. No longer feeling SOB. RR even and non-labored. Stomach discomfort started after taking medication on empty stomach. Pt has eaten since. BP 167/72 (99)

## 2021-07-30 NOTE — ED Triage Notes (Signed)
Pt to ED via EMS from home c/o being woken up tonight around 0245 with palpitations and lump in her throat and SOB.  Took a tums without relief at home.  Found by EMS to be afib rvr with rate 180 and given total 5mg  metoprolol and rebound rate 130, BP 181/97, CBG 143.  Pt A&Ox4, chest rise even and unlabored, skin WNL, Dr. Beather Arbour at bedside.

## 2021-07-31 ENCOUNTER — Inpatient Hospital Stay: Payer: Medicare Other

## 2021-07-31 ENCOUNTER — Encounter: Payer: Self-pay | Admitting: Internal Medicine

## 2021-07-31 DIAGNOSIS — I10 Essential (primary) hypertension: Secondary | ICD-10-CM

## 2021-07-31 DIAGNOSIS — D869 Sarcoidosis, unspecified: Secondary | ICD-10-CM

## 2021-07-31 DIAGNOSIS — I4891 Unspecified atrial fibrillation: Secondary | ICD-10-CM

## 2021-07-31 LAB — ECHOCARDIOGRAM COMPLETE
Area-P 1/2: 3.53 cm2
Height: 62 in
S' Lateral: 2.6 cm
Weight: 2384 oz

## 2021-07-31 LAB — CBC
HCT: 40 % (ref 36.0–46.0)
Hemoglobin: 13.1 g/dL (ref 12.0–15.0)
MCH: 29.7 pg (ref 26.0–34.0)
MCHC: 32.8 g/dL (ref 30.0–36.0)
MCV: 90.7 fL (ref 80.0–100.0)
Platelets: 249 10*3/uL (ref 150–400)
RBC: 4.41 MIL/uL (ref 3.87–5.11)
RDW: 11.9 % (ref 11.5–15.5)
WBC: 6.5 10*3/uL (ref 4.0–10.5)
nRBC: 0 % (ref 0.0–0.2)

## 2021-07-31 LAB — BASIC METABOLIC PANEL
Anion gap: 8 (ref 5–15)
BUN: 16 mg/dL (ref 8–23)
CO2: 26 mmol/L (ref 22–32)
Calcium: 8.9 mg/dL (ref 8.9–10.3)
Chloride: 103 mmol/L (ref 98–111)
Creatinine, Ser: 0.79 mg/dL (ref 0.44–1.00)
GFR, Estimated: >60 mL/min (ref >60)
Glucose, Bld: 100 mg/dL — ABNORMAL HIGH (ref 70–99)
Potassium: 3.7 mmol/L (ref 3.5–5.1)
Sodium: 137 mmol/L (ref 135–145)

## 2021-07-31 NOTE — Progress Notes (Signed)
PROGRESS NOTE    Kristi OGUINN  Barrera:[id number] DOB: 11-21-1940 DOA: 07/30/2021 PCP: Marguarite Arbour, MD  Assessment & Plan:   Principal Problem:   Atrial fibrillation with RVR (HCC) Active Problems:   Sarcoidosis (HCC)   Hyperlipidemia   Hypothyroidism   Anxiety   HTN (hypertension)   Hypertensive urgency   A. fib: w/ RVR. CHA2DS2-VASc 4. Converted to NSR after treated with metoprolol bolus and 1 hour of diltiazem gtt. Continue on eliquis, verpamil. Echo shows EF 55-60%, grade I diastolic dysfunction & no regional wall motion abnormalities. Cardio following and recs apprec   HTN urgency: w/ hx of HTN.  SBP is up to 210 mmHg in ED. Urgency resolved but still w/ HTN. Continue on irbesartan, imdur, verpamil.   Sarcoidosis: no acute issues currently    Hyperlipidemia: not taking a statin currently    Hypothyroidism: continue on home dose of synthroid    Anxiety: severity unknown. Continue on home dose of xanax     DVT prophylaxis: eliquis  Code Status: full  Family Communication: discussed pt's care w/ pt's family at bedside and answered their questions  Disposition Plan: likely d/c back home   Level of care: Progressive  Status is: Inpatient  Remains inpatient appropriate because: severity of illness    Consultants:  Cardio   Procedures:   Antimicrobials:    Subjective: Pt c/o palpitations   Objective: Vitals:   07/31/21 0400 07/31/21 0430 07/31/21 0530 07/31/21 0600  BP: (!) 110/54 (!) 129/57 (!) 133/55 (!) 142/62  Pulse: (!) 48 (!) 54 (!) 52 (!) 54  Resp: 19 16 15 17   Temp:      TempSrc:      SpO2: 92% 95% 95% 95%  Weight:      Height:        Intake/Output Summary (Last 24 hours) at 07/31/2021 0757 Last data filed at 07/30/2021 2004 Gross per 24 hour  Intake --  Output 500 ml  Net -500 ml   Filed Weights   07/30/21 0428  Weight: 67.6 kg    Examination:  General exam: Appears calm and comfortable  Respiratory system: Clear  to auscultation. Respiratory effort normal. Cardiovascular system: irregularly irregular. No  rubs, gallops or clicks.  Gastrointestinal system: Abdomen is nondistended, soft and nontender.  Normal bowel sounds heard. Central nervous system: Alert and oriented. Moves all extremities  Psychiatry: Judgement and insight appear normal. Flat mood and affect     Data Reviewed: I have personally reviewed following labs and imaging studies  CBC: Recent Labs  Lab 07/30/21 0430  WBC 7.4  NEUTROABS 4.1  HGB 14.2  HCT 43.4  MCV 91.4  PLT 296   Basic Metabolic Panel: Recent Labs  Lab 07/30/21 0430  NA 138  K 3.8  CL 105  CO2 28  GLUCOSE 120*  BUN 13  CREATININE 0.82  CALCIUM 9.5  MG 2.3   GFR: Estimated Creatinine Clearance: 51 mL/min (by C-G formula based on SCr of 0.82 mg/dL). Liver Function Tests: Recent Labs  Lab 07/30/21 0430  AST 19  ALT 14  ALKPHOS 67  BILITOT 0.5  PROT 7.3  ALBUMIN 4.3   No results for input(s): LIPASE, AMYLASE in the last 168 hours. No results for input(s): AMMONIA in the last 168 hours. Coagulation Profile: No results for input(s): INR, PROTIME in the last 168 hours. Cardiac Enzymes: No results for input(s): CKTOTAL, CKMB, CKMBINDEX, TROPONINI in the last 168 hours. BNP (last 3 results) No results for input(s): PROBNP  in the last 8760 hours. HbA1C: No results for input(s): HGBA1C in the last 72 hours. CBG: No results for input(s): GLUCAP in the last 168 hours. Lipid Profile: No results for input(s): CHOL, HDL, LDLCALC, TRIG, CHOLHDL, LDLDIRECT in the last 72 hours. Thyroid Function Tests: Recent Labs    07/30/21 0430  TSH 9.589*  FREET4 1.14*   Anemia Panel: No results for input(s): VITAMINB12, FOLATE, FERRITIN, TIBC, IRON, RETICCTPCT in the last 72 hours. Sepsis Labs: No results for input(s): PROCALCITON, LATICACIDVEN in the last 168 hours.  Recent Results (from the past 240 hour(s))  Resp Panel by RT-PCR (Flu A&B, Covid)  Nasopharyngeal Swab     Status: None   Collection Time: 07/30/21  4:30 AM   Specimen: Nasopharyngeal Swab; Nasopharyngeal(NP) swabs in vial transport medium  Result Value Ref Range Status   SARS Coronavirus 2 by RT PCR NEGATIVE NEGATIVE Final    Comment: (NOTE) SARS-CoV-2 target nucleic acids are NOT DETECTED.  The SARS-CoV-2 RNA is generally detectable in upper respiratory specimens during the acute phase of infection. The lowest concentration of SARS-CoV-2 viral copies this assay can detect is 138 copies/mL. A negative result does not preclude SARS-Cov-2 infection and should not be used as the sole basis for treatment or other patient management decisions. A negative result may occur with  improper specimen collection/handling, submission of specimen other than nasopharyngeal swab, presence of viral mutation(s) within the areas targeted by this assay, and inadequate number of viral copies(<138 copies/mL). A negative result must be combined with clinical observations, patient history, and epidemiological information. The expected result is Negative.  Fact Sheet for Patients:  BloggerCourse.com  Fact Sheet for Healthcare Providers:  SeriousBroker.it  This test is no t yet approved or cleared by the [place] FDA and  has been authorized for detection and/or diagnosis of SARS-CoV-2 by FDA under an Emergency Use Authorization (EUA). This EUA will remain  in effect (meaning this test can be used) for the duration of the COVID-19 declaration under Section 564(b)(1) of the Act, 21 U.S.C.section 360bbb-3(b)(1), unless the authorization is terminated  or revoked sooner.       Influenza A by PCR NEGATIVE NEGATIVE Final   Influenza B by PCR NEGATIVE NEGATIVE Final    Comment: (NOTE) The Xpert Xpress SARS-CoV-2/FLU/RSV plus assay is intended as an aid in the diagnosis of influenza from Nasopharyngeal swab specimens and should not be  used as a sole basis for treatment. Nasal washings and aspirates are unacceptable for Xpert Xpress SARS-CoV-2/FLU/RSV testing.  Fact Sheet for Patients: BloggerCourse.com  Fact Sheet for Healthcare Providers: SeriousBroker.it  This test is not yet approved or cleared by the [place] FDA and has been authorized for detection and/or diagnosis of SARS-CoV-2 by FDA under an Emergency Use Authorization (EUA). This EUA will remain in effect (meaning this test can be used) for the duration of the COVID-19 declaration under Section 564(b)(1) of the Act, 21 U.S.C. section 360bbb-3(b)(1), unless the authorization is terminated or revoked.  Performed at [facility], [address] [place], [place] [address]          Radiology Studies: DG Chest [place] 1 View  Result Date: 07/30/2021 CLINICAL DATA:  Heart palpitations and a lump in the throat, with shortness of breath. Tachycardia. EXAM: PORTABLE CHEST 1 VIEW COMPARISON:  Portable chest 07/20/2021 FINDINGS: There is mild cardiomegaly, normal caliber central vessels. The lungs demonstrate fibrosis within upper zonal predominance. No focal pneumonia is seen. The sulci are sharp. Calcification again noted  transverse aorta and slight aortic tortuosity. Stable mediastinum. Osteopenia. IMPRESSION: No active disease.  Stable chest with cardiomegaly and fibrosis. Electronically Signed   By: Almira Bar M.D.   On: 07/30/2021 05:04        Scheduled Meds:  ALPRAZolam  0.25 mg Oral TID   apixaban  5 mg Oral BID   dicyclomine  10 mg Oral BID   irbesartan  150 mg Oral Daily   isosorbide mononitrate  60 mg Oral BID   levothyroxine  100 mcg Oral Q0600   verapamil  240 mg Oral QHS   Continuous Infusions:   LOS: 1 day    Time spent: 30 mins     Charise Killian, MD [facility] Pager [phone]  If 7PM-7AM, please contact night-coverage 07/31/2021, 7:57 AM

## 2021-07-31 NOTE — ED Notes (Signed)
Pt alert, NAD, calm, interactive, resps e/u. Denies sx or complaints. Husband at [facility]. Meds given.

## 2021-07-31 NOTE — ED Notes (Signed)
Patient is resting comfortably. 

## 2021-07-31 NOTE — ED Notes (Signed)
Pt ambulated self efficiently to restroom. Pt returned safely to bed.

## 2021-07-31 NOTE — Progress Notes (Signed)
Pt refused. Pt aware if she changes her mind a CPAP will be made available to her.

## 2021-07-31 NOTE — Consult Note (Signed)
CARDIOLOGY CONSULT NOTE               Patient ID: Kristi Barrera MRN: [id number] DOB/AGE: 1941/07/19 81 y.o.  Admit date: 07/30/2021 Referring Physician Dr. Clyde Lundborg Primary Physician Dr. Judithann Sheen Primary Cardiologist Dr. Lady Gary Reason for Consultation AF with RVR  HPI: Pt is a 80yoF with a PMH significant for paroxysmal atrial fibrillation on eliquis, severe mitral regurgitation by TTE 10/02/20, intermittent LBBB, hypertension, hyperlipidemia, hypothyroidism, GERD, anxiety who presented to [facility] ED from home with palpitations and was found to be in AF with RVR. She converted to NSR around 0630 after 1 hour of diltiazem gtt. blood pressure was severely elevated after admission which is a chronic issue.  Interval History: - Feels well this morning.  - No shortness of breath or chest pain. - Remains in sinus bradycardia.     Review of systems complete and found to be negative unless listed above   Past Medical History:  Diagnosis Date   Abnormal cardiac valve    LEAKING HEART VALVE   Anxiety    Arthritis    Chest pain    Chronic low back pain    Colitis, chronic, ulcerative (HCC)    Dysrhythmia    GERD (gastroesophageal reflux disease)    HTN (hypertension)    Hyperlipidemia    Hypothyroidism    Low blood potassium    Palpitations    Recurrent UTI    Sarcoidosis    [place]---[facility] 15-64yrs ago   Sinusitis    Skin cancer    Sleep apnea    cpap with 2 l o2   Thyroid disease    Vitamin D deficiency     Past Surgical History:  Procedure Laterality Date   BREAST CYST ASPIRATION Left    neg   BREAST CYST ASPIRATION Right 09/25/2019   CATARACT EXTRACTION W/PHACO Right 04/06/2017   Procedure: CATARACT EXTRACTION PHACO AND INTRAOCULAR LENS PLACEMENT (IOC);  Surgeon: Galen Manila, MD;  Location: [facility] ORS;  Service: Ophthalmology;  Laterality: Right;  [place] 00:36.1 AP% 18.5 CDE 6.66 fluid Pack lot # [id number]   COLONOSCOPY WITH PROPOFOL N/A 08/06/2015    Procedure: COLONOSCOPY WITH PROPOFOL;  Surgeon: Scot Jun, MD;  Location: [facility] ENDOSCOPY;  Service: Endoscopy;  Laterality: N/A;   COLONOSCOPY WITH PROPOFOL N/A 01/15/2016   Procedure: COLONOSCOPY WITH PROPOFOL;  Surgeon: Scot Jun, MD;  Location: [facility] ENDOSCOPY;  Service: Endoscopy;  Laterality: N/A;   Urinary procedure      (Not in a hospital admission)  Social History   Socioeconomic History   Marital status: Married    Spouse name: Not on file   Number of children: Not on file   Years of education: Not on file   Highest education level: Not on file  Occupational History   Occupation: retired  Tobacco Use   Smoking status: Never   Smokeless tobacco: Never  Substance and Sexual Activity   Alcohol use: No   Drug use: No   Sexual activity: Not on file  Other Topics Concern   Not on file  Social History Narrative   Not on file   Social Determinants of Health   Financial Resource Strain: Not on file  Food Insecurity: Not on file  Transportation Needs: Not on file  Physical Activity: Not on file  Stress: Not on file  Social Connections: Not on file  Intimate Partner Violence: Not on file    Family History  Problem Relation Age of Onset   Heart disease  Father        MI   Stroke Father    Pneumonia Father    Hypertension Father    Stroke Mother    Rheum arthritis Mother    Breast cancer Neg Hx       Review of systems complete and found to be negative unless listed above    PHYSICAL EXAM General: Pleasant elderly Caucasian female, well nourished, in no acute distress.  Sitting upright in ED bed with son and daughter-in-law at bedside HEENT:  Normocephalic and atraumatic. Neck:  No JVD.  Lungs: Normal respiratory effort on room air. Clear bilaterally to auscultation. No wheezes, crackles, rhonchi.  Heart: HRRR . Normal S1 and S2 without gallops or murmurs. Radial & DP pulses 2+ bilaterally. Abdomen: Non-distended appearing.  Msk: Normal strength and  tone for age. Extremities: No clubbing, cyanosis or edema.   Neuro: Alert and oriented X 3. Psych:  Mood appropriate, affect congruent.   Labs:   Lab Results  Component Value Date   WBC 7.4 07/30/2021   HGB 14.2 07/30/2021   HCT 43.4 07/30/2021   MCV 91.4 07/30/2021   PLT 296 07/30/2021    Recent Labs  Lab 07/30/21 0430  NA 138  K 3.8  CL 105  CO2 28  BUN 13  CREATININE 0.82  CALCIUM 9.5  PROT 7.3  BILITOT 0.5  ALKPHOS 67  ALT 14  AST 19  GLUCOSE 120*    Lab Results  Component Value Date   CKTOTAL 84 07/10/2013   CKMB < 0.5 (L) 07/10/2013   TROPONINI <0.03 04/06/2018    No results found for: CHOL No results found for: HDL No results found for: LDLCALC No results found for: TRIG No results found for: CHOLHDL No results found for: LDLDIRECT    Radiology: DG Chest Port 1 View  Result Date: 07/30/2021 CLINICAL DATA:  Heart palpitations and a lump in the throat, with shortness of breath. Tachycardia. EXAM: PORTABLE CHEST 1 VIEW COMPARISON:  Portable chest 07/20/2021 FINDINGS: There is mild cardiomegaly, normal caliber central vessels. The lungs demonstrate fibrosis within upper zonal predominance. No focal pneumonia is seen. The sulci are sharp. Calcification again noted transverse aorta and slight aortic tortuosity. Stable mediastinum. Osteopenia. IMPRESSION: No active disease.  Stable chest with cardiomegaly and fibrosis. Electronically Signed   By: Almira Bar M.D.   On: 07/30/2021 05:04   DG Chest Port 1 View  Result Date: 07/20/2021 CLINICAL DATA:  Chest pain. EXAM: PORTABLE CHEST 1 VIEW COMPARISON:  April 17, 2019. FINDINGS: Stable cardiomediastinal silhouette. Both lungs are clear. The visualized skeletal structures are unremarkable. IMPRESSION: No active disease. Electronically Signed   By: Lupita Raider M.D.   On: 07/20/2021 14:06    ECHO 10/02/20 DOPPLER ECHO and OTHER SPECIAL PROCEDURES                 Aortic: MILD AR                    No AS                          176.5 cm/sec peak vel      12.5 mmHg peak grad                 Mitral: SEVERE MR                  No MS  MV Inflow E Vel = 135.0 cm/sec      MV Annulus E'Vel = 6.5 cm/sec                         E/E'Ratio = 20.8              Tricuspid: MODERATE TR                No TS                         352.9 cm/sec peak TR vel   59.8 mmHg peak RV pressure              Pulmonary: No PR                      No PS                         85.2 cm/sec peak vel       2.9 mmHg peak grad  __________________________________________________________________________ INTERPRETATION  NORMAL LEFT VENTRICULAR SYSTOLIC FUNCTION  NORMAL RIGHT VENTRICULAR SYSTOLIC FUNCTION  SEVERE VALVULAR REGURGITATION (See above)  NO VALVULAR STENOSIS  MODERATE TR  SEVERE MR  MILD AR  EF 50%   TELEMETRY reviewed by me: Normal sinus rhythm with rate of 66 bpm  EKG reviewed by me: NSR rate 69, LBBB (unchanged form previous 07/20/21 and 04/06/18  ASSESSMENT AND PLAN:  80yoF with a PMH significant for paroxysmal atrial fibrillation on eliquis, severe mitral regurgitation by TTE 10/02/20, intermittent LBBB, hypertension, hyperlipidemia, hypothyroidism, GERD, anxiety who presented to [facility] ED from home with palpitations and was found to be in AF with RVR. She converted to NSR around 0630 after 1 hour of diltiazem gtt.   #Atrial fibrillation with RVR #Severe MR by TTE 10/02/2020 Presented with palpitations and a feeling of fullness in her throat.  -converted to NSR s/p metoprolol bolus and 1 hour of diltiazem gtt around 0630. -CHA2DS2-VASc 4.  Reports compliance with Eliquis since her initial atrial fibrillation diagnosis 11/2020 -Continue verapamil at increased dose 240 mg for now for rate control. Home dose was 180mg  per PCP on 1/5.  -Continue Eliquis for stroke risk reduction -Ordered repeat echocardiogram for assessment of heart function and monitoring of her known severe  MR  #Hypertension Long standing h/o severe HTN per patient report with multiple drug allergies. States that at home her systolic BP is often >190. Never had secondary workup.  -Ordered bilateral renal ultrasound to assess for potential causes of secondary hypertension.  Creatinine and GFR are at 0.82 and greater than 60 respectively. -Continue irbesartan 150 once daily. Home med is valsartan 160mg  PO BID.  - Continue verapamil -consider initiation of spironolactone pending her blood pressure trends after she receives her home medications  #Hyperlipidemia -LDL was 144, total cholesterol 220 at recent outpatient visit 12/29, recommend high intensity statin therapy.  It appears she is currently only on diet therapy per her last PCP note.  #Hypothyroidism #GERD Management per primary team   Signed: Armando Reichert , MD 07/31/2021, 7:53 AM

## 2021-07-31 NOTE — ED Notes (Addendum)
Report received from [place].

## 2021-08-01 DIAGNOSIS — E785 Hyperlipidemia, unspecified: Secondary | ICD-10-CM

## 2021-08-01 LAB — CBC
HCT: 38.9 % (ref 36.0–46.0)
Hemoglobin: 12.7 g/dL (ref 12.0–15.0)
MCH: 29.5 pg (ref 26.0–34.0)
MCHC: 32.6 g/dL (ref 30.0–36.0)
MCV: 90.3 fL (ref 80.0–100.0)
Platelets: 247 10*3/uL (ref 150–400)
RBC: 4.31 MIL/uL (ref 3.87–5.11)
RDW: 11.7 % (ref 11.5–15.5)
WBC: 5.7 10*3/uL (ref 4.0–10.5)
nRBC: 0 % (ref 0.0–0.2)

## 2021-08-01 LAB — T3, FREE: T3, Free: 2.1 pg/mL (ref 2.0–4.4)

## 2021-08-01 LAB — BASIC METABOLIC PANEL
Anion gap: 6 (ref 5–15)
BUN: 16 mg/dL (ref 8–23)
CO2: 27 mmol/L (ref 22–32)
Calcium: 9.1 mg/dL (ref 8.9–10.3)
Chloride: 105 mmol/L (ref 98–111)
Creatinine, Ser: 0.82 mg/dL (ref 0.44–1.00)
GFR, Estimated: >60 mL/min (ref >60)
Glucose, Bld: 91 mg/dL (ref 70–99)
Potassium: 3.7 mmol/L (ref 3.5–5.1)
Sodium: 138 mmol/L (ref 135–145)

## 2021-08-01 LAB — MAGNESIUM: Magnesium: 2.2 mg/dL (ref 1.7–2.4)

## 2021-08-01 MED ORDER — SPIRONOLACTONE 25 MG PO TABS
12.5000 mg | ORAL_TABLET | Freq: Every day | ORAL | Status: DC
  Administered 2021-08-01: 12.5 mg via ORAL
  Filled 2021-08-01: qty 1
  Filled 2021-08-01: qty 0.5

## 2021-08-01 MED ORDER — SPIRONOLACTONE 25 MG PO TABS: 12.5000 mg | ORAL_TABLET | Freq: Every day | ORAL | 0 refills | Status: DC

## 2021-08-01 NOTE — Consult Note (Signed)
renal arteries and renal parenchyma. The bilateral renal veins appear patent. Scattered mixed echogenic plaque within a normal caliber abdominal  aorta. IMPRESSION: 1. Isolated elevated peak systolic velocity within the mid aspect of the right renal artery is without associated abnormal right renal artery/aortic ratio or associated asymmetric renal atrophy, favored to be technique related though could be seen in the setting of early/mild renal artery stenosis. If clinical concern persists, further evaluation with CTA of the abdomen pelvis could be performed as indicated. 2. Otherwise, unremarkable renal artery ultrasound. 3. Aortic Atherosclerosis (ICD10-I70.0). Electronically Signed   By: Sandi Mariscal M.D.   On: 07/31/2021 08:58   ECHOCARDIOGRAM COMPLETE  Result Date: 07/31/2021    ECHOCARDIOGRAM REPORT   Patient Name:   Kristi Barrera Date of Exam: 07/30/2021 Medical Rec #:  [id number]              Height:       62.0 in Accession #:    [id number]             Weight:       149.0 lb Date of Birth:  1941-05-30             BSA:          1.687 m Patient Age:    81 years               BP:           139/72 mmHg Patient Gender: F                      HR:           69 bpm. Exam Location:  [facility] Procedure: 2D Echo, Cardiac Doppler and Color Doppler Indications:     I48.91 Atrial Fibrillation  History:         Patient has no prior history of Echocardiogram examinations.                  Signs/Symptoms:Chest Pain; Risk Factors:Hypertension,                  Dyslipidemia and Sleep Apnea. Palpitations. Hypothyroidism.                  Sarcoidosis.  Sonographer:     Cresenciano Lick RDCS Referring Phys:  [id number] [place] TANG Diagnosing Phys: Donnelly Angelica IMPRESSIONS  1. Left ventricular ejection fraction, by estimation, is 55 to 60%. The left ventricle has normal function. The left ventricle has no regional wall motion abnormalities. Left ventricular diastolic parameters are consistent with Grade I diastolic dysfunction (impaired relaxation).  2. Right ventricular systolic function is normal. The right ventricular size is normal.  3. The mitral valve is  degenerative. Mild mitral valve regurgitation.  4. The aortic valve is normal in structure. Aortic valve regurgitation is trivial. No aortic stenosis is present.  5. The inferior vena cava is normal in size with greater than 50% respiratory variability, suggesting right atrial pressure of 3 mmHg. FINDINGS  Left Ventricle: Left ventricular ejection fraction, by estimation, is 55 to 60%. The left ventricle has normal function. The left ventricle has no regional wall motion abnormalities. The left ventricular internal cavity size was normal in size. There is  no left ventricular hypertrophy. Left ventricular diastolic parameters are consistent with Grade I diastolic dysfunction (impaired relaxation). Right Ventricle: The right ventricular size is normal. No increase in right ventricular wall thickness. Right ventricular systolic function is normal. Left Atrium:  renal arteries and renal parenchyma. The bilateral renal veins appear patent. Scattered mixed echogenic plaque within a normal caliber abdominal  aorta. IMPRESSION: 1. Isolated elevated peak systolic velocity within the mid aspect of the right renal artery is without associated abnormal right renal artery/aortic ratio or associated asymmetric renal atrophy, favored to be technique related though could be seen in the setting of early/mild renal artery stenosis. If clinical concern persists, further evaluation with CTA of the abdomen pelvis could be performed as indicated. 2. Otherwise, unremarkable renal artery ultrasound. 3. Aortic Atherosclerosis (ICD10-I70.0). Electronically Signed   By: Sandi Mariscal M.D.   On: 07/31/2021 08:58   ECHOCARDIOGRAM COMPLETE  Result Date: 07/31/2021    ECHOCARDIOGRAM REPORT   Patient Name:   Kristi Barrera Date of Exam: 07/30/2021 Medical Rec #:  [id number]              Height:       62.0 in Accession #:    [id number]             Weight:       149.0 lb Date of Birth:  1941-05-30             BSA:          1.687 m Patient Age:    81 years               BP:           139/72 mmHg Patient Gender: F                      HR:           69 bpm. Exam Location:  [facility] Procedure: 2D Echo, Cardiac Doppler and Color Doppler Indications:     I48.91 Atrial Fibrillation  History:         Patient has no prior history of Echocardiogram examinations.                  Signs/Symptoms:Chest Pain; Risk Factors:Hypertension,                  Dyslipidemia and Sleep Apnea. Palpitations. Hypothyroidism.                  Sarcoidosis.  Sonographer:     Cresenciano Lick RDCS Referring Phys:  [id number] [place] T[place] Diagnosing Phys: Donnelly Angelica IMPRESSIONS  1. Left ventricular ejection fraction, by estimation, is 55 to 60%. The left ventricle has normal function. The left ventricle has no regional wall motion abnormalities. Left ventricular diastolic parameters are consistent with Grade I diastolic dysfunction (impaired relaxation).  2. Right ventricular systolic function is normal. The right ventricular size is normal.  3. The mitral valve is  degenerative. Mild mitral valve regurgitation.  4. The aortic valve is normal in structure. Aortic valve regurgitation is trivial. No aortic stenosis is present.  5. The inferior vena cava is normal in size with greater than 50% respiratory variability, suggesting right atrial pressure of 3 mmHg. FINDINGS  Left Ventricle: Left ventricular ejection fraction, by estimation, is 55 to 60%. The left ventricle has normal function. The left ventricle has no regional wall motion abnormalities. The left ventricular internal cavity size was normal in size. There is  no left ventricular hypertrophy. Left ventricular diastolic parameters are consistent with Grade I diastolic dysfunction (impaired relaxation). Right Ventricle: The right ventricular size is normal. No increase in right ventricular wall thickness. Right ventricular systolic function is normal. Left Atrium:  CARDIOLOGY CONSULT NOTE               Patient ID: Kristi Barrera MRN: [id number] DOB/AGE: [birth year] 81 y.o.  Admit date: 07/30/2021 Referring Physician Dr. Blaine Hamper Primary Physician Dr. Doy Hutching Primary Cardiologist Dr. Ubaldo Glassing Reason for Consultation AF with RVR  HPI: Pt is a 53yoF with a PMH significant for paroxysmal atrial fibrillation on eliquis, severe mitral regurgitation by TTE 10/02/20, intermittent LBBB, hypertension, hyperlipidemia, hypothyroidism, GERD, anxiety who presented to [facility] ED from home with palpitations and was found to be in AF with RVR. She converted to NSR around 0630 after 1 hour of diltiazem gtt. blood pressure was severely elevated after admission which is a chronic issue.  Interval History: - Feeling well now, but had some nausea this morning. - Didn't sleep. - no shortness of breath or chest pain.     Review of systems complete and found to be negative unless listed above   Past Medical History:  Diagnosis Date   Abnormal cardiac valve    LEAKING HEART VALVE   Anxiety    Arthritis    Chest pain    Chronic low back pain    Colitis, chronic, ulcerative (HCC)    Dysrhythmia    GERD (gastroesophageal reflux disease)    HTN (hypertension)    Hyperlipidemia    Hypothyroidism    Low blood potassium    Palpitations    Recurrent UTI    Sarcoidosis    [place]---[facility] 15-28yrs ago   Sinusitis    Skin cancer    Sleep apnea    cpap with 2 l o2   Thyroid disease    Vitamin D deficiency     Past Surgical History:  Procedure Laterality Date   BREAST CYST ASPIRATION Left    neg   BREAST CYST ASPIRATION Right 09/25/2019   CATARACT EXTRACTION W/PHACO Right 04/06/2017   Procedure: CATARACT EXTRACTION PHACO AND INTRAOCULAR LENS PLACEMENT ([facility]);  Surgeon: Birder Robson, MD;  Location: [facility] ORS;  Service: Ophthalmology;  Laterality: Right;  [place] 00:36.1 AP% 18.5 CDE 6.66 fluid Pack lot # [id number]   COLONOSCOPY WITH PROPOFOL N/A  08/06/2015   Procedure: COLONOSCOPY WITH PROPOFOL;  Surgeon: Manya Silvas, MD;  Location: [facility] ENDOSCOPY;  Service: Endoscopy;  Laterality: N/A;   COLONOSCOPY WITH PROPOFOL N/A 01/15/2016   Procedure: COLONOSCOPY WITH PROPOFOL;  Surgeon: Manya Silvas, MD;  Location: [facility] ENDOSCOPY;  Service: Endoscopy;  Laterality: N/A;   Urinary procedure      Medications Prior to Admission  Medication Sig Dispense Refill Last Dose   acetaminophen (TYLENOL) 500 MG tablet Take 2 tablets (1,000 mg total) by mouth every 6 (six) hours as needed for moderate pain. 100 tablet 2 prn at prn   ALPRAZolam (XANAX) 0.25 MG tablet Take 0.25 mg by mouth 3 (three) times daily.    07/29/2021 at 2330   dicyclomine (BENTYL) 20 MG tablet Take 10 mg by mouth 2 (two) times daily.   07/29/2021 at 1800   ELIQUIS 5 MG TABS tablet Take 1 tablet by mouth 2 (two) times daily.   07/29/2021 at 2200   isosorbide mononitrate (IMDUR) 60 MG 24 hr tablet Take 60 mg by mouth 2 (two) times daily.   07/29/2021 at 1700   SYNTHROID 100 MCG tablet Take 100 mcg by mouth every morning.   07/29/2021 at 0600   valsartan (DIOVAN) 160 MG tablet Take 160 mg by mouth 2 (two) times daily.   07/29/2021 at 2300   verapamil (CALAN-SR)  renal arteries and renal parenchyma. The bilateral renal veins appear patent. Scattered mixed echogenic plaque within a normal caliber abdominal  aorta. IMPRESSION: 1. Isolated elevated peak systolic velocity within the mid aspect of the right renal artery is without associated abnormal right renal artery/aortic ratio or associated asymmetric renal atrophy, favored to be technique related though could be seen in the setting of early/mild renal artery stenosis. If clinical concern persists, further evaluation with CTA of the abdomen pelvis could be performed as indicated. 2. Otherwise, unremarkable renal artery ultrasound. 3. Aortic Atherosclerosis (ICD10-I70.0). Electronically Signed   By: Sandi Mariscal M.D.   On: 07/31/2021 08:58   ECHOCARDIOGRAM COMPLETE  Result Date: 07/31/2021    ECHOCARDIOGRAM REPORT   Patient Name:   Kristi Barrera Date of Exam: 07/30/2021 Medical Rec #:  [id number]              Height:       62.0 in Accession #:    [id number]             Weight:       149.0 lb Date of Birth:  1941-05-30             BSA:          1.687 m Patient Age:    81 years               BP:           139/72 mmHg Patient Gender: F                      HR:           69 bpm. Exam Location:  [facility] Procedure: 2D Echo, Cardiac Doppler and Color Doppler Indications:     I48.91 Atrial Fibrillation  History:         Patient has no prior history of Echocardiogram examinations.                  Signs/Symptoms:Chest Pain; Risk Factors:Hypertension,                  Dyslipidemia and Sleep Apnea. Palpitations. Hypothyroidism.                  Sarcoidosis.  Sonographer:     Cresenciano Lick RDCS Referring Phys:  [id number] [place] T[place] Diagnosing Phys: Donnelly Angelica IMPRESSIONS  1. Left ventricular ejection fraction, by estimation, is 55 to 60%. The left ventricle has normal function. The left ventricle has no regional wall motion abnormalities. Left ventricular diastolic parameters are consistent with Grade I diastolic dysfunction (impaired relaxation).  2. Right ventricular systolic function is normal. The right ventricular size is normal.  3. The mitral valve is  degenerative. Mild mitral valve regurgitation.  4. The aortic valve is normal in structure. Aortic valve regurgitation is trivial. No aortic stenosis is present.  5. The inferior vena cava is normal in size with greater than 50% respiratory variability, suggesting right atrial pressure of 3 mmHg. FINDINGS  Left Ventricle: Left ventricular ejection fraction, by estimation, is 55 to 60%. The left ventricle has normal function. The left ventricle has no regional wall motion abnormalities. The left ventricular internal cavity size was normal in size. There is  no left ventricular hypertrophy. Left ventricular diastolic parameters are consistent with Grade I diastolic dysfunction (impaired relaxation). Right Ventricle: The right ventricular size is normal. No increase in right ventricular wall thickness. Right ventricular systolic function is normal. Left Atrium:  CARDIOLOGY CONSULT NOTE               Patient ID: Kristi Barrera MRN: [id number] DOB/AGE: [birth year] 81 y.o.  Admit date: 07/30/2021 Referring Physician Dr. Blaine Hamper Primary Physician Dr. Doy Hutching Primary Cardiologist Dr. Ubaldo Glassing Reason for Consultation AF with RVR  HPI: Pt is a 53yoF with a PMH significant for paroxysmal atrial fibrillation on eliquis, severe mitral regurgitation by TTE 10/02/20, intermittent LBBB, hypertension, hyperlipidemia, hypothyroidism, GERD, anxiety who presented to [facility] ED from home with palpitations and was found to be in AF with RVR. She converted to NSR around 0630 after 1 hour of diltiazem gtt. blood pressure was severely elevated after admission which is a chronic issue.  Interval History: - Feeling well now, but had some nausea this morning. - Didn't sleep. - no shortness of breath or chest pain.     Review of systems complete and found to be negative unless listed above   Past Medical History:  Diagnosis Date   Abnormal cardiac valve    LEAKING HEART VALVE   Anxiety    Arthritis    Chest pain    Chronic low back pain    Colitis, chronic, ulcerative (HCC)    Dysrhythmia    GERD (gastroesophageal reflux disease)    HTN (hypertension)    Hyperlipidemia    Hypothyroidism    Low blood potassium    Palpitations    Recurrent UTI    Sarcoidosis    [place]---[facility] 15-28yrs ago   Sinusitis    Skin cancer    Sleep apnea    cpap with 2 l o2   Thyroid disease    Vitamin D deficiency     Past Surgical History:  Procedure Laterality Date   BREAST CYST ASPIRATION Left    neg   BREAST CYST ASPIRATION Right 09/25/2019   CATARACT EXTRACTION W/PHACO Right 04/06/2017   Procedure: CATARACT EXTRACTION PHACO AND INTRAOCULAR LENS PLACEMENT ([facility]);  Surgeon: Birder Robson, MD;  Location: [facility] ORS;  Service: Ophthalmology;  Laterality: Right;  [place] 00:36.1 AP% 18.5 CDE 6.66 fluid Pack lot # [id number]   COLONOSCOPY WITH PROPOFOL N/A  08/06/2015   Procedure: COLONOSCOPY WITH PROPOFOL;  Surgeon: Manya Silvas, MD;  Location: [facility] ENDOSCOPY;  Service: Endoscopy;  Laterality: N/A;   COLONOSCOPY WITH PROPOFOL N/A 01/15/2016   Procedure: COLONOSCOPY WITH PROPOFOL;  Surgeon: Manya Silvas, MD;  Location: [facility] ENDOSCOPY;  Service: Endoscopy;  Laterality: N/A;   Urinary procedure      Medications Prior to Admission  Medication Sig Dispense Refill Last Dose   acetaminophen (TYLENOL) 500 MG tablet Take 2 tablets (1,000 mg total) by mouth every 6 (six) hours as needed for moderate pain. 100 tablet 2 prn at prn   ALPRAZolam (XANAX) 0.25 MG tablet Take 0.25 mg by mouth 3 (three) times daily.    07/29/2021 at 2330   dicyclomine (BENTYL) 20 MG tablet Take 10 mg by mouth 2 (two) times daily.   07/29/2021 at 1800   ELIQUIS 5 MG TABS tablet Take 1 tablet by mouth 2 (two) times daily.   07/29/2021 at 2200   isosorbide mononitrate (IMDUR) 60 MG 24 hr tablet Take 60 mg by mouth 2 (two) times daily.   07/29/2021 at 1700   SYNTHROID 100 MCG tablet Take 100 mcg by mouth every morning.   07/29/2021 at 0600   valsartan (DIOVAN) 160 MG tablet Take 160 mg by mouth 2 (two) times daily.   07/29/2021 at 2300   verapamil (CALAN-SR)  CARDIOLOGY CONSULT NOTE               Patient ID: Kristi Barrera MRN: [id number] DOB/AGE: [birth year] 81 y.o.  Admit date: 07/30/2021 Referring Physician Dr. Blaine Hamper Primary Physician Dr. Doy Hutching Primary Cardiologist Dr. Ubaldo Glassing Reason for Consultation AF with RVR  HPI: Pt is a 53yoF with a PMH significant for paroxysmal atrial fibrillation on eliquis, severe mitral regurgitation by TTE 10/02/20, intermittent LBBB, hypertension, hyperlipidemia, hypothyroidism, GERD, anxiety who presented to [facility] ED from home with palpitations and was found to be in AF with RVR. She converted to NSR around 0630 after 1 hour of diltiazem gtt. blood pressure was severely elevated after admission which is a chronic issue.  Interval History: - Feeling well now, but had some nausea this morning. - Didn't sleep. - no shortness of breath or chest pain.     Review of systems complete and found to be negative unless listed above   Past Medical History:  Diagnosis Date   Abnormal cardiac valve    LEAKING HEART VALVE   Anxiety    Arthritis    Chest pain    Chronic low back pain    Colitis, chronic, ulcerative (HCC)    Dysrhythmia    GERD (gastroesophageal reflux disease)    HTN (hypertension)    Hyperlipidemia    Hypothyroidism    Low blood potassium    Palpitations    Recurrent UTI    Sarcoidosis    [place]---[facility] 15-28yrs ago   Sinusitis    Skin cancer    Sleep apnea    cpap with 2 l o2   Thyroid disease    Vitamin D deficiency     Past Surgical History:  Procedure Laterality Date   BREAST CYST ASPIRATION Left    neg   BREAST CYST ASPIRATION Right 09/25/2019   CATARACT EXTRACTION W/PHACO Right 04/06/2017   Procedure: CATARACT EXTRACTION PHACO AND INTRAOCULAR LENS PLACEMENT ([facility]);  Surgeon: Birder Robson, MD;  Location: [facility] ORS;  Service: Ophthalmology;  Laterality: Right;  [place] 00:36.1 AP% 18.5 CDE 6.66 fluid Pack lot # [id number]   COLONOSCOPY WITH PROPOFOL N/A  08/06/2015   Procedure: COLONOSCOPY WITH PROPOFOL;  Surgeon: Manya Silvas, MD;  Location: [facility] ENDOSCOPY;  Service: Endoscopy;  Laterality: N/A;   COLONOSCOPY WITH PROPOFOL N/A 01/15/2016   Procedure: COLONOSCOPY WITH PROPOFOL;  Surgeon: Manya Silvas, MD;  Location: [facility] ENDOSCOPY;  Service: Endoscopy;  Laterality: N/A;   Urinary procedure      Medications Prior to Admission  Medication Sig Dispense Refill Last Dose   acetaminophen (TYLENOL) 500 MG tablet Take 2 tablets (1,000 mg total) by mouth every 6 (six) hours as needed for moderate pain. 100 tablet 2 prn at prn   ALPRAZolam (XANAX) 0.25 MG tablet Take 0.25 mg by mouth 3 (three) times daily.    07/29/2021 at 2330   dicyclomine (BENTYL) 20 MG tablet Take 10 mg by mouth 2 (two) times daily.   07/29/2021 at 1800   ELIQUIS 5 MG TABS tablet Take 1 tablet by mouth 2 (two) times daily.   07/29/2021 at 2200   isosorbide mononitrate (IMDUR) 60 MG 24 hr tablet Take 60 mg by mouth 2 (two) times daily.   07/29/2021 at 1700   SYNTHROID 100 MCG tablet Take 100 mcg by mouth every morning.   07/29/2021 at 0600   valsartan (DIOVAN) 160 MG tablet Take 160 mg by mouth 2 (two) times daily.   07/29/2021 at 2300   verapamil (CALAN-SR)  renal arteries and renal parenchyma. The bilateral renal veins appear patent. Scattered mixed echogenic plaque within a normal caliber abdominal  aorta. IMPRESSION: 1. Isolated elevated peak systolic velocity within the mid aspect of the right renal artery is without associated abnormal right renal artery/aortic ratio or associated asymmetric renal atrophy, favored to be technique related though could be seen in the setting of early/mild renal artery stenosis. If clinical concern persists, further evaluation with CTA of the abdomen pelvis could be performed as indicated. 2. Otherwise, unremarkable renal artery ultrasound. 3. Aortic Atherosclerosis (ICD10-I70.0). Electronically Signed   By: Sandi Mariscal M.D.   On: 07/31/2021 08:58   ECHOCARDIOGRAM COMPLETE  Result Date: 07/31/2021    ECHOCARDIOGRAM REPORT   Patient Name:   Kristi Barrera Date of Exam: 07/30/2021 Medical Rec #:  [id number]              Height:       62.0 in Accession #:    [id number]             Weight:       149.0 lb Date of Birth:  1941-05-30             BSA:          1.687 m Patient Age:    81 years               BP:           139/72 mmHg Patient Gender: F                      HR:           69 bpm. Exam Location:  [facility] Procedure: 2D Echo, Cardiac Doppler and Color Doppler Indications:     I48.91 Atrial Fibrillation  History:         Patient has no prior history of Echocardiogram examinations.                  Signs/Symptoms:Chest Pain; Risk Factors:Hypertension,                  Dyslipidemia and Sleep Apnea. Palpitations. Hypothyroidism.                  Sarcoidosis.  Sonographer:     Cresenciano Lick RDCS Referring Phys:  [id number] [place] T[place] Diagnosing Phys: Donnelly Angelica IMPRESSIONS  1. Left ventricular ejection fraction, by estimation, is 55 to 60%. The left ventricle has normal function. The left ventricle has no regional wall motion abnormalities. Left ventricular diastolic parameters are consistent with Grade I diastolic dysfunction (impaired relaxation).  2. Right ventricular systolic function is normal. The right ventricular size is normal.  3. The mitral valve is  degenerative. Mild mitral valve regurgitation.  4. The aortic valve is normal in structure. Aortic valve regurgitation is trivial. No aortic stenosis is present.  5. The inferior vena cava is normal in size with greater than 50% respiratory variability, suggesting right atrial pressure of 3 mmHg. FINDINGS  Left Ventricle: Left ventricular ejection fraction, by estimation, is 55 to 60%. The left ventricle has normal function. The left ventricle has no regional wall motion abnormalities. The left ventricular internal cavity size was normal in size. There is  no left ventricular hypertrophy. Left ventricular diastolic parameters are consistent with Grade I diastolic dysfunction (impaired relaxation). Right Ventricle: The right ventricular size is normal. No increase in right ventricular wall thickness. Right ventricular systolic function is normal. Left Atrium:

## 2021-08-01 NOTE — Discharge Summary (Signed)
Consultations: Cardio    Procedures/Studies: DG Chest Port 1 View  Result Date: 07/30/2021 CLINICAL DATA:  Heart palpitations and a lump in the throat, with shortness of breath. Tachycardia. EXAM: PORTABLE CHEST 1 VIEW COMPARISON:  Portable chest 07/20/2021 FINDINGS: There is mild cardiomegaly, normal caliber central vessels. The lungs demonstrate fibrosis within upper zonal predominance. No focal pneumonia is seen. The sulci are sharp. Calcification again noted transverse aorta and slight aortic tortuosity. Stable mediastinum. Osteopenia. IMPRESSION: No active disease.  Stable chest with cardiomegaly and fibrosis. Electronically Signed   By: Telford Nab M.D.   On: 07/30/2021 05:04   DG Chest Port 1 View  Result Date: 07/20/2021 CLINICAL DATA:  Chest pain. EXAM: PORTABLE CHEST 1 VIEW COMPARISON:  April 17, 2019. FINDINGS: Stable cardiomediastinal silhouette. Both lungs are clear. The visualized skeletal structures are unremarkable. IMPRESSION: No active disease. Electronically Signed   By: Marijo Conception M.D.   On: 07/20/2021 14:06   US RENAL ARTERY DUPLEX COMPLETE  Result Date: 07/31/2021 CLINICAL DATA:  Hypertension.   Evaluate for renal artery stenosis. EXAM: RENAL/URINARY TRACT ULTRASOUND RENAL DUPLEX DOPPLER ULTRASOUND COMPARISON:  CTA of the abdomen pelvis - 08/07/2015 FINDINGS: Right Kidney: Normal cortical thickness, echogenicity and size, measuring 9.0 cm in length. No focal renal lesions. No echogenic renal stones. No urinary obstruction. Left Kidney: Normal cortical thickness, echogenicity and size, measuring 9.5 cm in length. No focal renal lesions. No echogenic renal stones. No urinary obstruction. Bladder:  Appears normal given degree of distention. _________________________________________________________ RENAL DUPLEX ULTRASOUND Right Renal Artery Velocities: Origin:  149 cm/sec Mid:  232 cm/sec Hilum:  80 cm/sec Interlobar:  57 cm/sec Arcuate:  53 cm/sec Left Renal Artery Velocities: Origin:  158 cm/sec Mid:  165 cm/sec Hilum:  90 cm/sec Interlobar:  107 cm/sec Arcuate:  44 cm/sec Aortic Velocity:  209 cm/sec Right Renal-Aortic Ratios: Origin: 0.7 Mid:  1.1 Hilum: 0.4 Interlobar: 0.3 Arcuate: 0.3 Left Renal-Aortic Ratios: Origin: 0.8 Mid: 0.8 Hilum: 0.4 Interlobar: 0.5 Arcuate: 0.2 Isolated elevated peak systolic velocity involving the mid aspect of the right renal artery (image 67), is without abnormal right renal artery/aortic ratio. Otherwise, normal velocities and low resistance waveforms are demonstrated throughout the interrogated portions of the bilateral renal arteries and renal parenchyma. The bilateral renal veins appear patent. Scattered mixed echogenic plaque within a normal caliber abdominal aorta. IMPRESSION: 1. Isolated elevated peak systolic velocity within the mid aspect of the right renal artery is without associated abnormal right renal artery/aortic ratio or associated asymmetric renal atrophy, favored to be technique related though could be seen in the setting of early/mild renal artery stenosis. If clinical concern persists, further evaluation with CTA of the abdomen pelvis could be performed as  indicated. 2. Otherwise, unremarkable renal artery ultrasound. 3. Aortic Atherosclerosis (ICD10-I70.0). Electronically Signed   By: Sandi Mariscal M.D.   On: 07/31/2021 08:58   ECHOCARDIOGRAM COMPLETE  Result Date: 07/31/2021    ECHOCARDIOGRAM REPORT   Patient Name:   Kristi Barrera Date of Exam: 07/30/2021 Medical Rec #:  [id number]              Height:       62.0 in Accession #:    [id number]             Weight:       149.0 lb Date of Birth:  [birth year]             BSA:          1.687 m  Consultations: Cardio    Procedures/Studies: DG Chest Port 1 View  Result Date: 07/30/2021 CLINICAL DATA:  Heart palpitations and a lump in the throat, with shortness of breath. Tachycardia. EXAM: PORTABLE CHEST 1 VIEW COMPARISON:  Portable chest 07/20/2021 FINDINGS: There is mild cardiomegaly, normal caliber central vessels. The lungs demonstrate fibrosis within upper zonal predominance. No focal pneumonia is seen. The sulci are sharp. Calcification again noted transverse aorta and slight aortic tortuosity. Stable mediastinum. Osteopenia. IMPRESSION: No active disease.  Stable chest with cardiomegaly and fibrosis. Electronically Signed   By: Telford Nab M.D.   On: 07/30/2021 05:04   DG Chest Port 1 View  Result Date: 07/20/2021 CLINICAL DATA:  Chest pain. EXAM: PORTABLE CHEST 1 VIEW COMPARISON:  April 17, 2019. FINDINGS: Stable cardiomediastinal silhouette. Both lungs are clear. The visualized skeletal structures are unremarkable. IMPRESSION: No active disease. Electronically Signed   By: Marijo Conception M.D.   On: 07/20/2021 14:06   US RENAL ARTERY DUPLEX COMPLETE  Result Date: 07/31/2021 CLINICAL DATA:  Hypertension.   Evaluate for renal artery stenosis. EXAM: RENAL/URINARY TRACT ULTRASOUND RENAL DUPLEX DOPPLER ULTRASOUND COMPARISON:  CTA of the abdomen pelvis - 08/07/2015 FINDINGS: Right Kidney: Normal cortical thickness, echogenicity and size, measuring 9.0 cm in length. No focal renal lesions. No echogenic renal stones. No urinary obstruction. Left Kidney: Normal cortical thickness, echogenicity and size, measuring 9.5 cm in length. No focal renal lesions. No echogenic renal stones. No urinary obstruction. Bladder:  Appears normal given degree of distention. _________________________________________________________ RENAL DUPLEX ULTRASOUND Right Renal Artery Velocities: Origin:  149 cm/sec Mid:  232 cm/sec Hilum:  80 cm/sec Interlobar:  57 cm/sec Arcuate:  53 cm/sec Left Renal Artery Velocities: Origin:  158 cm/sec Mid:  165 cm/sec Hilum:  90 cm/sec Interlobar:  107 cm/sec Arcuate:  44 cm/sec Aortic Velocity:  209 cm/sec Right Renal-Aortic Ratios: Origin: 0.7 Mid:  1.1 Hilum: 0.4 Interlobar: 0.3 Arcuate: 0.3 Left Renal-Aortic Ratios: Origin: 0.8 Mid: 0.8 Hilum: 0.4 Interlobar: 0.5 Arcuate: 0.2 Isolated elevated peak systolic velocity involving the mid aspect of the right renal artery (image 67), is without abnormal right renal artery/aortic ratio. Otherwise, normal velocities and low resistance waveforms are demonstrated throughout the interrogated portions of the bilateral renal arteries and renal parenchyma. The bilateral renal veins appear patent. Scattered mixed echogenic plaque within a normal caliber abdominal aorta. IMPRESSION: 1. Isolated elevated peak systolic velocity within the mid aspect of the right renal artery is without associated abnormal right renal artery/aortic ratio or associated asymmetric renal atrophy, favored to be technique related though could be seen in the setting of early/mild renal artery stenosis. If clinical concern persists, further evaluation with CTA of the abdomen pelvis could be performed as  indicated. 2. Otherwise, unremarkable renal artery ultrasound. 3. Aortic Atherosclerosis (ICD10-I70.0). Electronically Signed   By: Sandi Mariscal M.D.   On: 07/31/2021 08:58   ECHOCARDIOGRAM COMPLETE  Result Date: 07/31/2021    ECHOCARDIOGRAM REPORT   Patient Name:   Kristi Barrera Date of Exam: 07/30/2021 Medical Rec #:  [id number]              Height:       62.0 in Accession #:    [id number]             Weight:       149.0 lb Date of Birth:  [birth year]             BSA:          1.687 m  Physician Discharge Summary  REMONIA OTTE [id number] DOB: August 27, 1940 DOA: 07/30/2021  PCP: Idelle Crouch, MD  Admit date: 07/30/2021 Discharge date: 08/01/2021  Admitted From: home  Disposition: home   Recommendations for Outpatient Follow-up:  Follow up with PCP in 1-2 weeks F/u w/ cardio, Dr. Corky Sox, in 1-2 weeks  Home Health: no  Equipment/Devices:  Discharge Condition: stable  CODE STATUS: full  Diet recommendation: Heart Healthy   Brief/Interim Summary: HPI was taken from Dr. Blaine Hamper: Maia Breslow is a 81 y.o. female with medical history significant of Eliquis on Xarelto, hypertension, hyperlipidemia, OSA on CPAP with 2 L oxygen at night, GERD, hypothyroidism, anxiety, sarcoidosis, chronic ulcerative colitis, PAH, GI bleeding, who presents with palpitation.   Pt states that her symptoms started in the early morning at about 2:45 PM. She reports palpitation and heart racing, associated with mild shortness breath, no chest pain, fever or chills. She took a tums without relief at home.  Per EMS report, patient was found to have A fib with RVR with heart rate up to 180s. Pt  was given total 5mg  metoprolol. HR improved to 130s. Cardizem gtt was started in ED, HR improved to 50-60s.  Patient has nausea, no vomiting, diarrhea or abdominal pain.  No symptoms of UTI.  She states that she uses CPAP with 2 L oxygen at night.     ED Course: pt was found to have WBC 7.4, negative COVID PCR, troponin level 8, 13, TSH 9.589 and free T4 1.14, negative urinalysis, electrolytes renal function okay, temperature normal, blood pressure 169/68, RR 25, oxygen sat 99% on room air.  Chest x-ray negative.  Patient is admitted to progressive bed by accepting MD.  Dr. Corky Sox of cardiology is consulted   Hospital course from Dr. Jimmye Norman 1/12-1/13/23: Pt was found to have a. fib w/ RVR. Pt was initially treated w/ metoprolol bolus & IV diltiazem drip. Pt converted to NSR. Pt was d/c home of  eliquis, verpamil for a. Fib. Pt will continue on home dose of valsartan, imdur and new rx for aldactone as per cardio. For more information, please see previous progress/consult notes.   Discharge Diagnoses:  Principal Problem:   Atrial fibrillation with RVR ([place]) Active Problems:   Sarcoidosis (HCC)   Hyperlipidemia   Hypothyroidism   Anxiety   HTN (hypertension)   Hypertensive urgency  A. fib: w/ RVR. Likely PAF. CHA2DS2-VASc 4. Converted to NSR after treated with metoprolol bolus and 1 hour of diltiazem gtt. Continue on eliquis, verpamil. Echo shows EF 27-03%, grade I diastolic dysfunction & no regional wall motion abnormalities, mild MR & mitral valve is degenerative. Cardio following and recs apprec   Chronic diastolic CHF: continue on verapmil, valsartan & spironolactone as per cardio   HTN urgency: w/ hx of HTN.  SBP is up to 210 mmHg in ED. Urgency resolved but still w/ HTN. Continue on irbesartan, imdur, verpamil.   Sarcoidosis: no acute issues currently    Hyperlipidemia: not taking a statin currently    Hypothyroidism: continue on home dose of synthroid    Anxiety: severity unknown. Continue on home dose of xanax   Discharge Instructions  Discharge Instructions     Diet - low sodium heart healthy   Complete by: As directed    Discharge instructions   Complete by: As directed    F/u w/ cardio, Dr. Corky Sox, in 1-2 weeks. F/u w/ PCP in 1-2 weeks   Increase activity slowly   Complete by: As directed  Physician Discharge Summary  REMONIA OTTE [id number] DOB: August 27, 1940 DOA: 07/30/2021  PCP: Idelle Crouch, MD  Admit date: 07/30/2021 Discharge date: 08/01/2021  Admitted From: home  Disposition: home   Recommendations for Outpatient Follow-up:  Follow up with PCP in 1-2 weeks F/u w/ cardio, Dr. Corky Sox, in 1-2 weeks  Home Health: no  Equipment/Devices:  Discharge Condition: stable  CODE STATUS: full  Diet recommendation: Heart Healthy   Brief/Interim Summary: HPI was taken from Dr. Blaine Hamper: Maia Breslow is a 81 y.o. female with medical history significant of Eliquis on Xarelto, hypertension, hyperlipidemia, OSA on CPAP with 2 L oxygen at night, GERD, hypothyroidism, anxiety, sarcoidosis, chronic ulcerative colitis, PAH, GI bleeding, who presents with palpitation.   Pt states that her symptoms started in the early morning at about 2:45 PM. She reports palpitation and heart racing, associated with mild shortness breath, no chest pain, fever or chills. She took a tums without relief at home.  Per EMS report, patient was found to have A fib with RVR with heart rate up to 180s. Pt  was given total 5mg  metoprolol. HR improved to 130s. Cardizem gtt was started in ED, HR improved to 50-60s.  Patient has nausea, no vomiting, diarrhea or abdominal pain.  No symptoms of UTI.  She states that she uses CPAP with 2 L oxygen at night.     ED Course: pt was found to have WBC 7.4, negative COVID PCR, troponin level 8, 13, TSH 9.589 and free T4 1.14, negative urinalysis, electrolytes renal function okay, temperature normal, blood pressure 169/68, RR 25, oxygen sat 99% on room air.  Chest x-ray negative.  Patient is admitted to progressive bed by accepting MD.  Dr. Corky Sox of cardiology is consulted   Hospital course from Dr. Jimmye Norman 1/12-1/13/23: Pt was found to have a. fib w/ RVR. Pt was initially treated w/ metoprolol bolus & IV diltiazem drip. Pt converted to NSR. Pt was d/c home of  eliquis, verpamil for a. Fib. Pt will continue on home dose of valsartan, imdur and new rx for aldactone as per cardio. For more information, please see previous progress/consult notes.   Discharge Diagnoses:  Principal Problem:   Atrial fibrillation with RVR ([place]) Active Problems:   Sarcoidosis (HCC)   Hyperlipidemia   Hypothyroidism   Anxiety   HTN (hypertension)   Hypertensive urgency  A. fib: w/ RVR. Likely PAF. CHA2DS2-VASc 4. Converted to NSR after treated with metoprolol bolus and 1 hour of diltiazem gtt. Continue on eliquis, verpamil. Echo shows EF 27-03%, grade I diastolic dysfunction & no regional wall motion abnormalities, mild MR & mitral valve is degenerative. Cardio following and recs apprec   Chronic diastolic CHF: continue on verapmil, valsartan & spironolactone as per cardio   HTN urgency: w/ hx of HTN.  SBP is up to 210 mmHg in ED. Urgency resolved but still w/ HTN. Continue on irbesartan, imdur, verpamil.   Sarcoidosis: no acute issues currently    Hyperlipidemia: not taking a statin currently    Hypothyroidism: continue on home dose of synthroid    Anxiety: severity unknown. Continue on home dose of xanax   Discharge Instructions  Discharge Instructions     Diet - low sodium heart healthy   Complete by: As directed    Discharge instructions   Complete by: As directed    F/u w/ cardio, Dr. Corky Sox, in 1-2 weeks. F/u w/ PCP in 1-2 weeks   Increase activity slowly   Complete by: As directed  Physician Discharge Summary  REMONIA OTTE [id number] DOB: August 27, 1940 DOA: 07/30/2021  PCP: Idelle Crouch, MD  Admit date: 07/30/2021 Discharge date: 08/01/2021  Admitted From: home  Disposition: home   Recommendations for Outpatient Follow-up:  Follow up with PCP in 1-2 weeks F/u w/ cardio, Dr. Corky Sox, in 1-2 weeks  Home Health: no  Equipment/Devices:  Discharge Condition: stable  CODE STATUS: full  Diet recommendation: Heart Healthy   Brief/Interim Summary: HPI was taken from Dr. Blaine Hamper: Maia Breslow is a 81 y.o. female with medical history significant of Eliquis on Xarelto, hypertension, hyperlipidemia, OSA on CPAP with 2 L oxygen at night, GERD, hypothyroidism, anxiety, sarcoidosis, chronic ulcerative colitis, PAH, GI bleeding, who presents with palpitation.   Pt states that her symptoms started in the early morning at about 2:45 PM. She reports palpitation and heart racing, associated with mild shortness breath, no chest pain, fever or chills. She took a tums without relief at home.  Per EMS report, patient was found to have A fib with RVR with heart rate up to 180s. Pt  was given total 5mg  metoprolol. HR improved to 130s. Cardizem gtt was started in ED, HR improved to 50-60s.  Patient has nausea, no vomiting, diarrhea or abdominal pain.  No symptoms of UTI.  She states that she uses CPAP with 2 L oxygen at night.     ED Course: pt was found to have WBC 7.4, negative COVID PCR, troponin level 8, 13, TSH 9.589 and free T4 1.14, negative urinalysis, electrolytes renal function okay, temperature normal, blood pressure 169/68, RR 25, oxygen sat 99% on room air.  Chest x-ray negative.  Patient is admitted to progressive bed by accepting MD.  Dr. Corky Sox of cardiology is consulted   Hospital course from Dr. Jimmye Norman 1/12-1/13/23: Pt was found to have a. fib w/ RVR. Pt was initially treated w/ metoprolol bolus & IV diltiazem drip. Pt converted to NSR. Pt was d/c home of  eliquis, verpamil for a. Fib. Pt will continue on home dose of valsartan, imdur and new rx for aldactone as per cardio. For more information, please see previous progress/consult notes.   Discharge Diagnoses:  Principal Problem:   Atrial fibrillation with RVR ([place]) Active Problems:   Sarcoidosis (HCC)   Hyperlipidemia   Hypothyroidism   Anxiety   HTN (hypertension)   Hypertensive urgency  A. fib: w/ RVR. Likely PAF. CHA2DS2-VASc 4. Converted to NSR after treated with metoprolol bolus and 1 hour of diltiazem gtt. Continue on eliquis, verpamil. Echo shows EF 27-03%, grade I diastolic dysfunction & no regional wall motion abnormalities, mild MR & mitral valve is degenerative. Cardio following and recs apprec   Chronic diastolic CHF: continue on verapmil, valsartan & spironolactone as per cardio   HTN urgency: w/ hx of HTN.  SBP is up to 210 mmHg in ED. Urgency resolved but still w/ HTN. Continue on irbesartan, imdur, verpamil.   Sarcoidosis: no acute issues currently    Hyperlipidemia: not taking a statin currently    Hypothyroidism: continue on home dose of synthroid    Anxiety: severity unknown. Continue on home dose of xanax   Discharge Instructions  Discharge Instructions     Diet - low sodium heart healthy   Complete by: As directed    Discharge instructions   Complete by: As directed    F/u w/ cardio, Dr. Corky Sox, in 1-2 weeks. F/u w/ PCP in 1-2 weeks   Increase activity slowly   Complete by: As directed  Consultations: Cardio    Procedures/Studies: DG Chest Port 1 View  Result Date: 07/30/2021 CLINICAL DATA:  Heart palpitations and a lump in the throat, with shortness of breath. Tachycardia. EXAM: PORTABLE CHEST 1 VIEW COMPARISON:  Portable chest 07/20/2021 FINDINGS: There is mild cardiomegaly, normal caliber central vessels. The lungs demonstrate fibrosis within upper zonal predominance. No focal pneumonia is seen. The sulci are sharp. Calcification again noted transverse aorta and slight aortic tortuosity. Stable mediastinum. Osteopenia. IMPRESSION: No active disease.  Stable chest with cardiomegaly and fibrosis. Electronically Signed   By: Telford Nab M.D.   On: 07/30/2021 05:04   DG Chest Port 1 View  Result Date: 07/20/2021 CLINICAL DATA:  Chest pain. EXAM: PORTABLE CHEST 1 VIEW COMPARISON:  April 17, 2019. FINDINGS: Stable cardiomediastinal silhouette. Both lungs are clear. The visualized skeletal structures are unremarkable. IMPRESSION: No active disease. Electronically Signed   By: Marijo Conception M.D.   On: 07/20/2021 14:06   US RENAL ARTERY DUPLEX COMPLETE  Result Date: 07/31/2021 CLINICAL DATA:  Hypertension.   Evaluate for renal artery stenosis. EXAM: RENAL/URINARY TRACT ULTRASOUND RENAL DUPLEX DOPPLER ULTRASOUND COMPARISON:  CTA of the abdomen pelvis - 08/07/2015 FINDINGS: Right Kidney: Normal cortical thickness, echogenicity and size, measuring 9.0 cm in length. No focal renal lesions. No echogenic renal stones. No urinary obstruction. Left Kidney: Normal cortical thickness, echogenicity and size, measuring 9.5 cm in length. No focal renal lesions. No echogenic renal stones. No urinary obstruction. Bladder:  Appears normal given degree of distention. _________________________________________________________ RENAL DUPLEX ULTRASOUND Right Renal Artery Velocities: Origin:  149 cm/sec Mid:  232 cm/sec Hilum:  80 cm/sec Interlobar:  57 cm/sec Arcuate:  53 cm/sec Left Renal Artery Velocities: Origin:  158 cm/sec Mid:  165 cm/sec Hilum:  90 cm/sec Interlobar:  107 cm/sec Arcuate:  44 cm/sec Aortic Velocity:  209 cm/sec Right Renal-Aortic Ratios: Origin: 0.7 Mid:  1.1 Hilum: 0.4 Interlobar: 0.3 Arcuate: 0.3 Left Renal-Aortic Ratios: Origin: 0.8 Mid: 0.8 Hilum: 0.4 Interlobar: 0.5 Arcuate: 0.2 Isolated elevated peak systolic velocity involving the mid aspect of the right renal artery (image 67), is without abnormal right renal artery/aortic ratio. Otherwise, normal velocities and low resistance waveforms are demonstrated throughout the interrogated portions of the bilateral renal arteries and renal parenchyma. The bilateral renal veins appear patent. Scattered mixed echogenic plaque within a normal caliber abdominal aorta. IMPRESSION: 1. Isolated elevated peak systolic velocity within the mid aspect of the right renal artery is without associated abnormal right renal artery/aortic ratio or associated asymmetric renal atrophy, favored to be technique related though could be seen in the setting of early/mild renal artery stenosis. If clinical concern persists, further evaluation with CTA of the abdomen pelvis could be performed as  indicated. 2. Otherwise, unremarkable renal artery ultrasound. 3. Aortic Atherosclerosis (ICD10-I70.0). Electronically Signed   By: Sandi Mariscal M.D.   On: 07/31/2021 08:58   ECHOCARDIOGRAM COMPLETE  Result Date: 07/31/2021    ECHOCARDIOGRAM REPORT   Patient Name:   Kristi Barrera Date of Exam: 07/30/2021 Medical Rec #:  [id number]              Height:       62.0 in Accession #:    [id number]             Weight:       149.0 lb Date of Birth:  [birth year]             BSA:          1.687 m  Consultations: Cardio    Procedures/Studies: DG Chest Port 1 View  Result Date: 07/30/2021 CLINICAL DATA:  Heart palpitations and a lump in the throat, with shortness of breath. Tachycardia. EXAM: PORTABLE CHEST 1 VIEW COMPARISON:  Portable chest 07/20/2021 FINDINGS: There is mild cardiomegaly, normal caliber central vessels. The lungs demonstrate fibrosis within upper zonal predominance. No focal pneumonia is seen. The sulci are sharp. Calcification again noted transverse aorta and slight aortic tortuosity. Stable mediastinum. Osteopenia. IMPRESSION: No active disease.  Stable chest with cardiomegaly and fibrosis. Electronically Signed   By: Telford Nab M.D.   On: 07/30/2021 05:04   DG Chest Port 1 View  Result Date: 07/20/2021 CLINICAL DATA:  Chest pain. EXAM: PORTABLE CHEST 1 VIEW COMPARISON:  April 17, 2019. FINDINGS: Stable cardiomediastinal silhouette. Both lungs are clear. The visualized skeletal structures are unremarkable. IMPRESSION: No active disease. Electronically Signed   By: Marijo Conception M.D.   On: 07/20/2021 14:06   US RENAL ARTERY DUPLEX COMPLETE  Result Date: 07/31/2021 CLINICAL DATA:  Hypertension.   Evaluate for renal artery stenosis. EXAM: RENAL/URINARY TRACT ULTRASOUND RENAL DUPLEX DOPPLER ULTRASOUND COMPARISON:  CTA of the abdomen pelvis - 08/07/2015 FINDINGS: Right Kidney: Normal cortical thickness, echogenicity and size, measuring 9.0 cm in length. No focal renal lesions. No echogenic renal stones. No urinary obstruction. Left Kidney: Normal cortical thickness, echogenicity and size, measuring 9.5 cm in length. No focal renal lesions. No echogenic renal stones. No urinary obstruction. Bladder:  Appears normal given degree of distention. _________________________________________________________ RENAL DUPLEX ULTRASOUND Right Renal Artery Velocities: Origin:  149 cm/sec Mid:  232 cm/sec Hilum:  80 cm/sec Interlobar:  57 cm/sec Arcuate:  53 cm/sec Left Renal Artery Velocities: Origin:  158 cm/sec Mid:  165 cm/sec Hilum:  90 cm/sec Interlobar:  107 cm/sec Arcuate:  44 cm/sec Aortic Velocity:  209 cm/sec Right Renal-Aortic Ratios: Origin: 0.7 Mid:  1.1 Hilum: 0.4 Interlobar: 0.3 Arcuate: 0.3 Left Renal-Aortic Ratios: Origin: 0.8 Mid: 0.8 Hilum: 0.4 Interlobar: 0.5 Arcuate: 0.2 Isolated elevated peak systolic velocity involving the mid aspect of the right renal artery (image 67), is without abnormal right renal artery/aortic ratio. Otherwise, normal velocities and low resistance waveforms are demonstrated throughout the interrogated portions of the bilateral renal arteries and renal parenchyma. The bilateral renal veins appear patent. Scattered mixed echogenic plaque within a normal caliber abdominal aorta. IMPRESSION: 1. Isolated elevated peak systolic velocity within the mid aspect of the right renal artery is without associated abnormal right renal artery/aortic ratio or associated asymmetric renal atrophy, favored to be technique related though could be seen in the setting of early/mild renal artery stenosis. If clinical concern persists, further evaluation with CTA of the abdomen pelvis could be performed as  indicated. 2. Otherwise, unremarkable renal artery ultrasound. 3. Aortic Atherosclerosis (ICD10-I70.0). Electronically Signed   By: Sandi Mariscal M.D.   On: 07/31/2021 08:58   ECHOCARDIOGRAM COMPLETE  Result Date: 07/31/2021    ECHOCARDIOGRAM REPORT   Patient Name:   Kristi Barrera Date of Exam: 07/30/2021 Medical Rec #:  [id number]              Height:       62.0 in Accession #:    [id number]             Weight:       149.0 lb Date of Birth:  [birth year]             BSA:          1.687 m

## 2021-08-01 NOTE — Progress Notes (Signed)
Transition of Care [facility]) Screening Note   Patient Details  Name: Kristi Barrera Date of Birth: [birth year]   Transition of Care [facility]) CM/SW Contact:    Gildardo Griffes, LCSW Phone Number: 08/01/2021, 9:17 AM    Transition of Care Department [facility]) has reviewed patient and no TOC needs have been identified at this time. We will continue to monitor patient advancement through interdisciplinary progression rounds. If new patient transition needs arise, please place a TOC consult.  [place], [place] [phone]

## 2021-10-28 ENCOUNTER — Other Ambulatory Visit: Payer: Self-pay

## 2021-10-28 ENCOUNTER — Emergency Department: Payer: Medicare Other

## 2021-10-28 ENCOUNTER — Emergency Department
Admission: EM | Admit: 2021-10-28 | Discharge: 2021-10-28 | Disposition: A | Discharge status: Home / Self Care | Payer: Medicare Other | Attending: Student in an Organized Health Care Education/Training Program | Admitting: Student in an Organized Health Care Education/Training Program

## 2021-10-28 DIAGNOSIS — I4891 Unspecified atrial fibrillation: Secondary | ICD-10-CM | POA: Diagnosis not present

## 2021-10-28 DIAGNOSIS — Z7901 Long term (current) use of anticoagulants: Secondary | ICD-10-CM | POA: Diagnosis not present

## 2021-10-28 DIAGNOSIS — R002 Palpitations: Secondary | ICD-10-CM | POA: Diagnosis present

## 2021-10-28 LAB — BASIC METABOLIC PANEL
Anion gap: 7 (ref 5–15)
BUN: 17 mg/dL (ref 8–23)
CO2: 26 mmol/L (ref 22–32)
Calcium: 9.5 mg/dL (ref 8.9–10.3)
Chloride: 105 mmol/L (ref 98–111)
Creatinine, Ser: 1 mg/dL (ref 0.44–1.00)
GFR, Estimated: 58 mL/min — ABNORMAL LOW (ref >60)
Glucose, Bld: 110 mg/dL — ABNORMAL HIGH (ref 70–99)
Potassium: 4.2 mmol/L (ref 3.5–5.1)
Sodium: 138 mmol/L (ref 135–145)

## 2021-10-28 LAB — CBC
HCT: 42.9 % (ref 36.0–46.0)
Hemoglobin: 14.2 g/dL (ref 12.0–15.0)
MCH: 29.8 pg (ref 26.0–34.0)
MCHC: 33.1 g/dL (ref 30.0–36.0)
MCV: 89.9 fL (ref 80.0–100.0)
Platelets: 297 10*3/uL (ref 150–400)
RBC: 4.77 MIL/uL (ref 3.87–5.11)
RDW: 11.9 % (ref 11.5–15.5)
WBC: 6.4 10*3/uL (ref 4.0–10.5)
nRBC: 0 % (ref 0.0–0.2)

## 2021-10-28 LAB — TROPONIN I (HIGH SENSITIVITY)
Troponin I (High Sensitivity): 6 ng/L (ref <18)
Troponin I (High Sensitivity): 8 ng/L (ref <18)

## 2021-10-28 MED ORDER — APIXABAN 5 MG PO TABS
5.0000 mg | ORAL_TABLET | Freq: Two times a day (BID) | ORAL | Status: DC
  Administered 2021-10-28: 5 mg via ORAL
  Filled 2021-10-28: qty 1

## 2021-10-28 MED ORDER — ONDANSETRON 4 MG PO TBDP
4.0000 mg | ORAL_TABLET | Freq: Once | ORAL | Status: AC
  Administered 2021-10-28: 4 mg via ORAL
  Filled 2021-10-28: qty 1

## 2021-10-28 MED ORDER — VERAPAMIL HCL 40 MG PO TABS
40.0000 mg | ORAL_TABLET | Freq: Once | ORAL | Status: AC
  Administered 2021-10-28: 40 mg via ORAL
  Filled 2021-10-28: qty 1

## 2021-10-28 NOTE — ED Notes (Signed)
Pt in xray

## 2021-10-28 NOTE — ED Notes (Signed)
Verified correct patient and correct discharge papers given. Pt alert and oriented X 4, stable for discharge. RR even and unlabored, color WNL. Discussed discharge instructions and follow-up as directed. Discharge medications discussed, when prescribed. Pt had opportunity to ask questions, and RN available to provide patient and/or family education.   

## 2021-10-28 NOTE — ED Notes (Signed)
Up to bathroom and back to bed without difficulties or major change in HR. ?

## 2021-10-28 NOTE — ED Provider Notes (Signed)
? ?[facility] ?Provider Note ? ? ? Event Date/Time  ? First MD Initiated Contact with Patient 10/28/21 [phone]   ?  (approximate) ? ? ?History  ? ?Palpitations ? ? ?HPI ? ?Kristi Barrera is a 81 y.o. female with a history of paroxysmal A-fib on anticoagulation as well as verapamil 180 mg every night presents to the ER for evaluation of palpitations this around 6:00 this morning.  Last until she called EMS and was given metoprolol with conversion from heart rates consistent with A-fib with RVR down to normal rhythm.  She feels improved.  Denies any chest pain or pressure at this time.  Has been compliant with her medications.  No fevers or chills.  Denies any shortness of breath at this time. ?  ? ? ?Physical Exam  ? ?Triage Vital Signs: ?ED Triage Vitals  ?Enc Vitals Group  ?   BP 10/28/21 0928 (!) 144/92  ?   Pulse Rate 10/28/21 0928 (!) 42  ?   Resp 10/28/21 0928 16  ?   Temp 10/28/21 0928 97.8 ?F (36.6 ?C)  ?   Temp Source 10/28/21 0928 Oral  ?   SpO2 10/28/21 0928 97 %  ?   Weight 10/28/21 0924 142 lb (64.4 kg)  ?   Height 10/28/21 0924 '5\' 2"'$  (1.575 m)  ?   Head Circumference --   ?   Peak Flow --   ?   Pain Score 10/28/21 0924 0  ?   Pain Loc --   ?   Pain Edu? --   ?   Excl. in [place]? --   ? ? ?Most recent vital signs: ?Vitals:  ? 10/28/21 1158 10/28/21 1200  ?BP: (!) 154/66 (!) 154/66  ?Pulse: 84 78  ?Resp: 18 18  ?Temp:    ?SpO2: 96% 96%  ? ? ? ?Constitutional: Alert  ?Eyes: Conjunctivae are normal.  ?Head: Atraumatic. ?Nose: No congestion/rhinnorhea. ?Mouth/Throat: Mucous membranes are moist.   ?Neck: Painless ROM.  ?Cardiovascular:   Good peripheral circulation. ?Respiratory: Normal respiratory effort.  No retractions.  ?Gastrointestinal: Soft and nontender.  ?Musculoskeletal:  no deformity ?Neurologic:  MAE spontaneously. No gross focal neurologic deficits are appreciated.  ?Skin:  Skin is warm, dry and intact. No rash noted. ?Psychiatric: Mood and affect are normal. Speech and  behavior are normal. ? ? ? ?ED Results / Procedures / Treatments  ? ?Labs ?(all labs ordered are listed, but only abnormal results are displayed) ?Labs Reviewed  ?BASIC METABOLIC PANEL - Abnormal; Notable for the following components:  ?    Result Value  ? Glucose, Bld 110 (*)   ? GFR, Estimated 58 (*)   ? All other components within normal limits  ?CBC  ?TROPONIN I (HIGH SENSITIVITY)  ?TROPONIN I (HIGH SENSITIVITY)  ? ? ? ?EKG ? ?ED ECG REPORT ?I, Merlyn Lot, the attending physician, personally viewed and interpreted this ECG. ? ? Date: 10/28/2021 ? EKG Time: 9:27 ? Rate: 100 ? Rhythm: afib ? Axis: left ? Intervals:lbbb ? ST&T Change: nonspecific st abnm, no stemi ? ? ? ?RADIOLOGY ?Please see ED Course for my review and interpretation. ? ?I personally reviewed all radiographic images ordered to evaluate for the above acute complaints and reviewed radiology reports and findings.  These findings were personally discussed with the patient.  Please see medical record for radiology report. ? ? ? ?PROCEDURES: ? ?Critical Care performed: No ? ?.1-3 Lead EKG Interpretation ?Performed by: Merlyn Lot, MD ?Authorized by: Merlyn Lot,  MD  ? ?  Interpretation: abnormal   ?  ECG rate:  90 ?  ECG rate assessment: normal   ?  Rhythm: atrial fibrillation   ? ? ?MEDICATIONS ORDERED IN ED: ?Medications  ?apixaban (ELIQUIS) tablet 5 mg (5 mg Oral Given 10/28/21 1230)  ?verapamil (CALAN) tablet 40 mg (40 mg Oral Given 10/28/21 0949)  ?ondansetron (ZOFRAN-ODT) disintegrating tablet 4 mg (4 mg Oral Given 10/28/21 1149)  ? ? ? ?IMPRESSION / MDM / ASSESSMENT AND PLAN / ED COURSE  ?I reviewed the triage vital signs and the nursing notes. ?             ?               ? ?Differential diagnosis includes, but is not limited to, A-fib with RVR, dehydration, electrolyte abnormality, anemia, ACS, noncompliance, ? ?Patient presenting with to the ER with episode of palpitations discomfort found to be in A-fib with RVR prehospital  improved with beta-blocker administration.  She been compliant with her medications.  She was previously encouraged to go up to 240 of calan but then decreased that to 180 due to bradycardia in the morning.  She denies any chest pain or pressure at this time.  Blood work chest x-ray ordered for the but differential.  We will give additional dose of verapamil here in the ER as her heart rate still 90-80.  Placed on cardiac monitor. ?Clinical Course as of 10/28/21 1239  ?Tue Oct 28, 2021  ?1037 Chest x-ray on my review interpretation does not show pneumothorax or volume overload.  Troponin negative no anemia.  Blood work otherwise reassuring.  We will continue to observe.  Given her age and risk factors have considered admission but if remains in rate controlled A-fib, as this is a known diagnosis for her, think she might be appropriate for outpatient follow-up. [PR]  ?1237 Patient reassessed.  Repeat troponin negative.  Heart rate stable.  No RVR.  She remains asymptomatic in no acute distress.  Have recommended that she go back up to her 240 verapamil.  Does appear stable and appropriate for outpatient follow-up. [PR]  ?  ?Clinical Course User Index ?[PR] Merlyn Lot, MD  ? ? ? ?FINAL CLINICAL IMPRESSION(S) / ED DIAGNOSES  ? ?Final diagnoses:  ?Atrial fibrillation with RVR ([place])  ? ? ? ?Rx / DC Orders  ? ?ED Discharge Orders   ? ? None  ? ?  ? ? ? ?Note:  This document was prepared using Dragon voice recognition software and may include unintentional dictation errors. ? ?  ?Merlyn Lot, MD ?10/28/21 1240 ? ?

## 2021-10-28 NOTE — Discharge Instructions (Signed)
As we discussed, please start taking your verapamil '240mg'$  as was recommended by cardiology.  Please follow-up in clinic.  If you start having any recurrent pain, pressure, shortness of breath or other concerns please return to the ER. ?

## 2021-10-28 NOTE — ED Notes (Signed)
Back from xray

## 2021-10-28 NOTE — ED Triage Notes (Signed)
BIB [facility] from home due to palpitations; EMS found pt to be in afib RVR rate of 180. Pt given '5mg'$  metoprolol PIV. Rate now 80-120, afib. Initial BP 228/140, now 180/80. 100% on RA. Pt denies CP. ?

## 2021-10-28 NOTE — ED Notes (Signed)
Pt states she is nauseated, EDP aware and medication ordered and administered. ?

## 2021-10-28 NOTE — ED Notes (Signed)
Pt concerned about missing her 10AM dose of Eliquis. EDP informed, orders received.,. ?

## 2021-10-28 NOTE — ED Notes (Signed)
EDP at bedside  

## 2022-01-15 IMAGING — MG DIGITAL DIAGNOSTIC BILAT W/ TOMO W/ CAD
8 series · 8 of 24 positions shown · non-contrast
Comparison: Prior films

CLINICAL DATA: Callback from screening mammogram for possible
masses in both breasts.



[R MLO synth-2D]
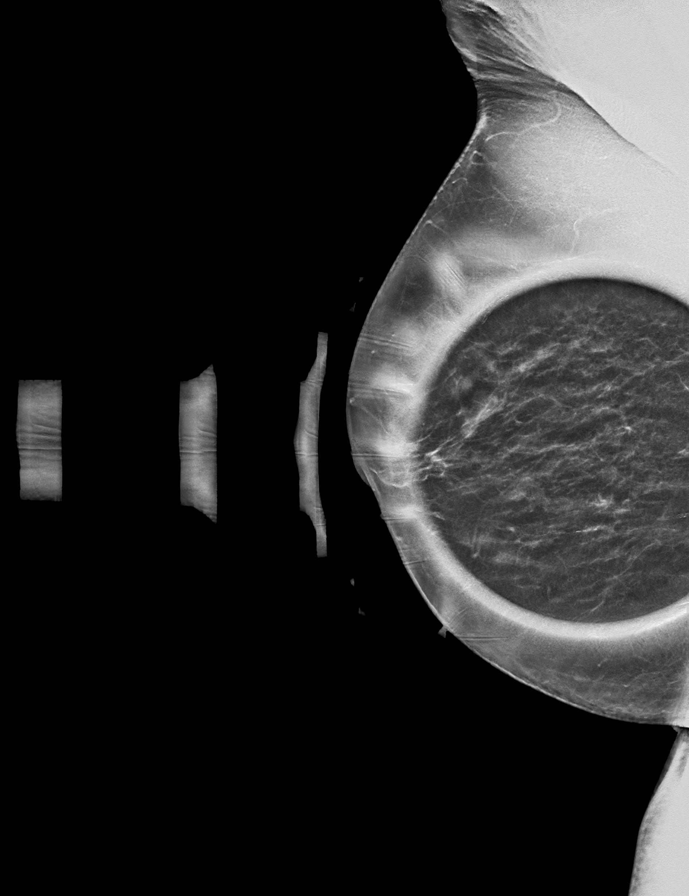

[L CC synth-2D]
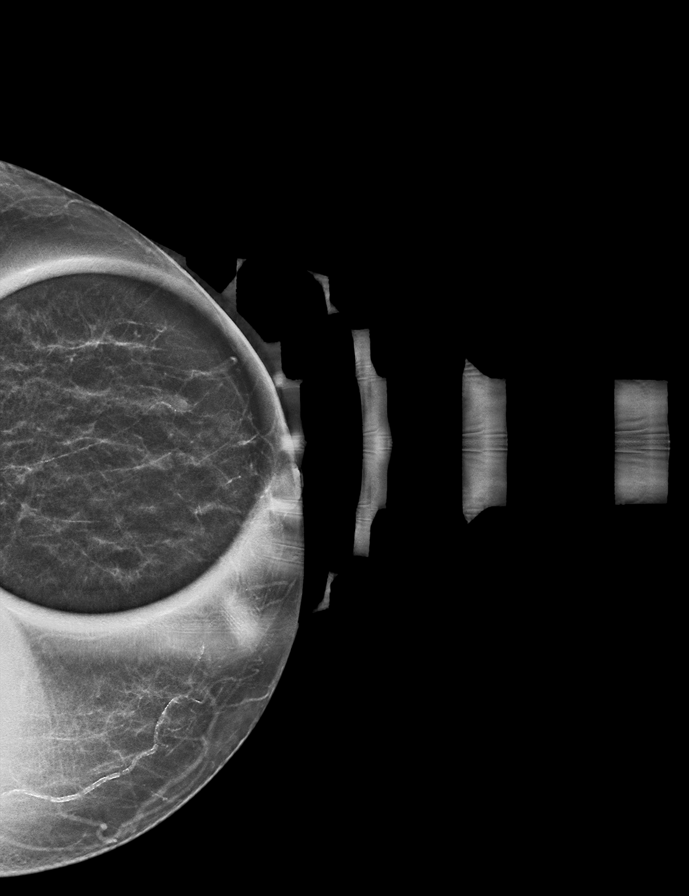

[R CC synth-2D]
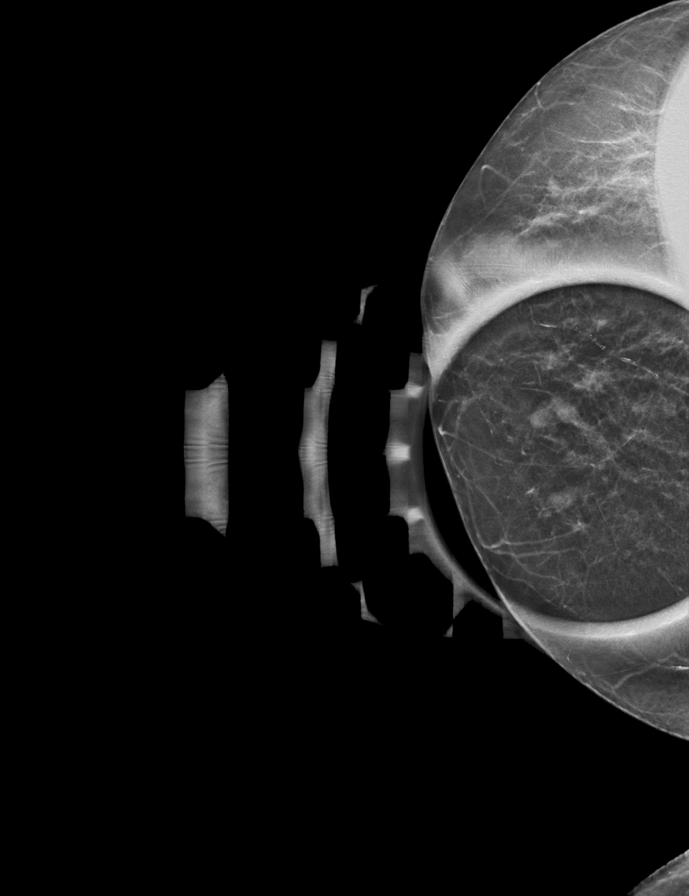

[L MLO synth-2D]
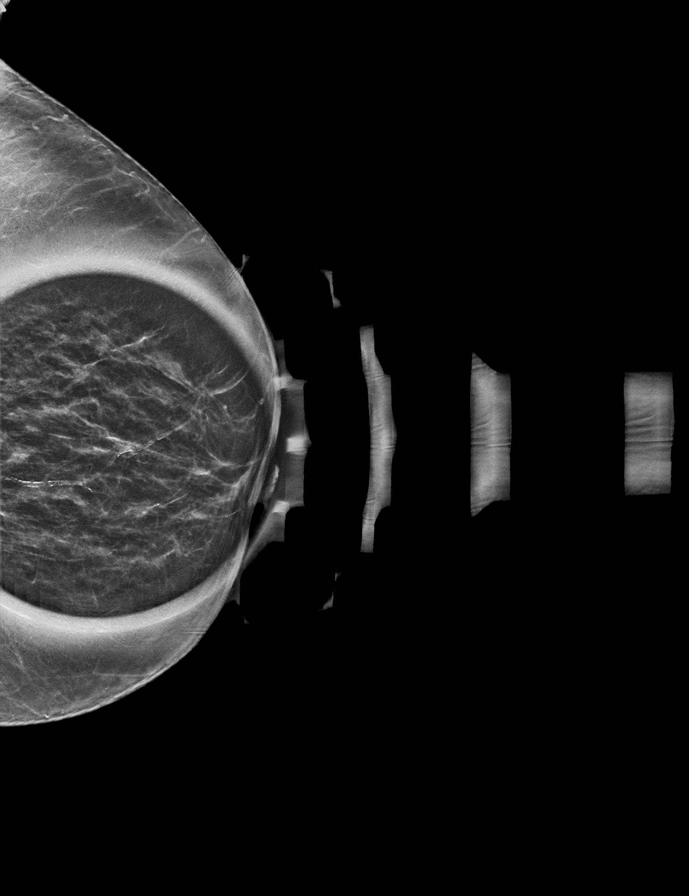

[R CC tomo · tomo slice 19/37.0]
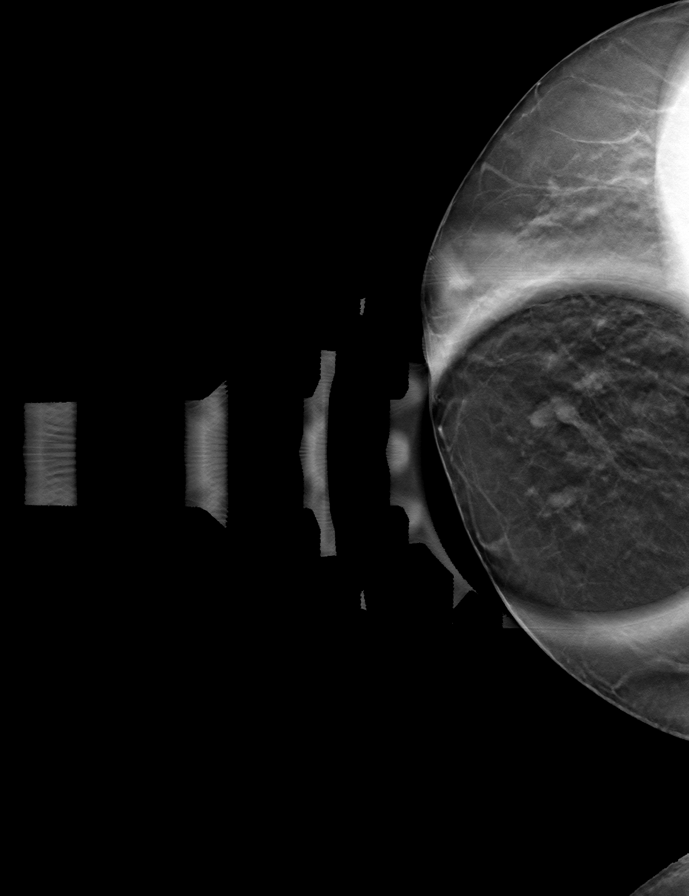

[R MLO tomo · tomo slice 23/46.0]
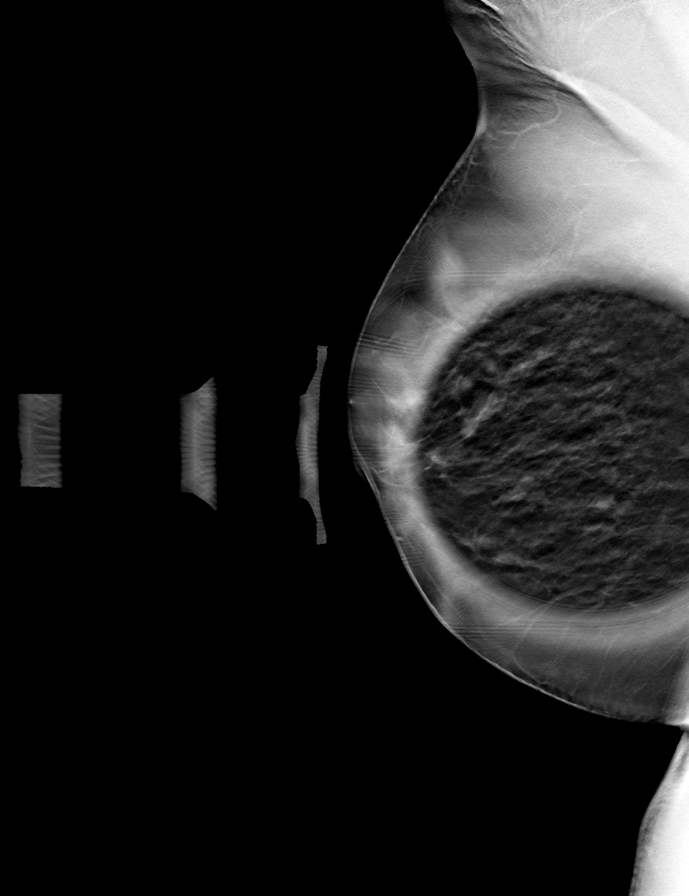

[L MLO tomo · tomo slice 23/45.0]
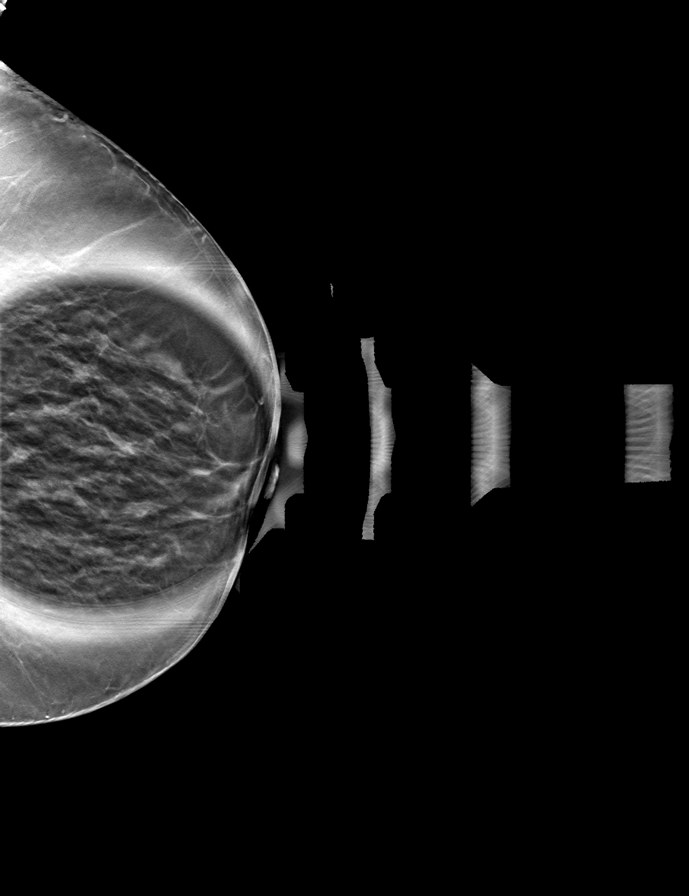

[L CC tomo · tomo slice 21/40.0]
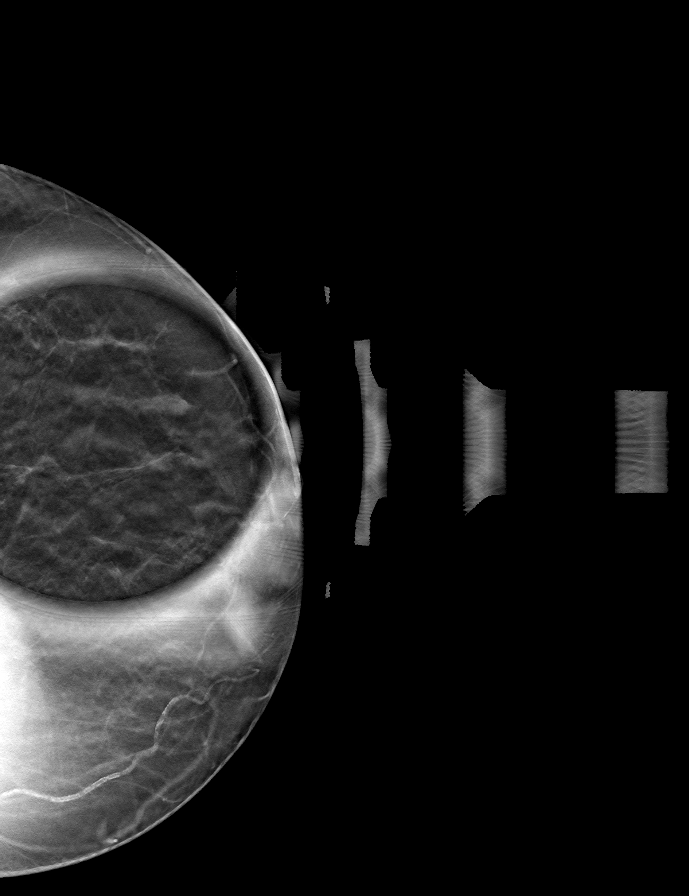

[8 of 24 positions shown; findings below may reference images not displayed]

ACR Breast Density Category b: There are scattered areas of
fibroglandular density.
FINDINGS: Spot compression cc and MLO views of bilateral breasts are
submitted. The previously noted masses in the right breast 1 o'clock
are persistent. The previously questioned mass in the upper-outer
quadrant left breast is persistent.

Targeted ultrasound is performed, showing 4.5 mm simple cyst at left
breast 1 o'clock 3 cm from nipple correlating to the mammographic
mass. Ultrasound of the right breast demonstrate several adjacent
simple cysts at the right breast 1 o'clock 1 cm from nipple
measuring maximum combined dimension of 6 mm correlating to the
mammographic finding.
IMPRESSION: Benign findings.

RECOMMENDATION:
Routine screening mammogram back on schedule.

I have discussed the findings and recommendations with the patient.
If applicable, a reminder letter will be sent to the patient
regarding the next appointment.

BI-RADS CATEGORY  2: Benign.

## 2022-01-27 ENCOUNTER — Other Ambulatory Visit: Payer: Self-pay

## 2022-01-27 ENCOUNTER — Emergency Department: Payer: Medicare Other

## 2022-01-27 ENCOUNTER — Emergency Department
Admission: EM | Admit: 2022-01-27 | Discharge: 2022-01-27 | Disposition: A | Discharge status: Home / Self Care | Payer: Medicare Other | Attending: Emergency Medicine | Admitting: Emergency Medicine

## 2022-01-27 DIAGNOSIS — I10 Essential (primary) hypertension: Secondary | ICD-10-CM | POA: Diagnosis not present

## 2022-01-27 DIAGNOSIS — I4891 Unspecified atrial fibrillation: Secondary | ICD-10-CM | POA: Insufficient documentation

## 2022-01-27 DIAGNOSIS — R079 Chest pain, unspecified: Secondary | ICD-10-CM | POA: Diagnosis present

## 2022-01-27 LAB — TROPONIN I (HIGH SENSITIVITY)
Troponin I (High Sensitivity): 6 ng/L (ref <18)
Troponin I (High Sensitivity): 18 ng/L — ABNORMAL HIGH (ref <18)

## 2022-01-27 LAB — CBC
HCT: 43.7 % (ref 36.0–46.0)
Hemoglobin: 14.6 g/dL (ref 12.0–15.0)
MCH: 30.3 pg (ref 26.0–34.0)
MCHC: 33.4 g/dL (ref 30.0–36.0)
MCV: 90.7 fL (ref 80.0–100.0)
Platelets: 286 10*3/uL (ref 150–400)
RBC: 4.82 MIL/uL (ref 3.87–5.11)
RDW: 11.4 % — ABNORMAL LOW (ref 11.5–15.5)
WBC: 5.4 10*3/uL (ref 4.0–10.5)
nRBC: 0 % (ref 0.0–0.2)

## 2022-01-27 LAB — COMPREHENSIVE METABOLIC PANEL
ALT: 15 U/L (ref 0–44)
AST: 19 U/L (ref 15–41)
Albumin: 4.4 g/dL (ref 3.5–5.0)
Alkaline Phosphatase: 56 U/L (ref 38–126)
Anion gap: 8 (ref 5–15)
BUN: 16 mg/dL (ref 8–23)
CO2: 24 mmol/L (ref 22–32)
Calcium: 9.7 mg/dL (ref 8.9–10.3)
Chloride: 105 mmol/L (ref 98–111)
Creatinine, Ser: 1.06 mg/dL — ABNORMAL HIGH (ref 0.44–1.00)
GFR, Estimated: 54 mL/min — ABNORMAL LOW (ref >60)
Glucose, Bld: 131 mg/dL — ABNORMAL HIGH (ref 70–99)
Potassium: 3.9 mmol/L (ref 3.5–5.1)
Sodium: 137 mmol/L (ref 135–145)
Total Bilirubin: 0.6 mg/dL (ref 0.3–1.2)
Total Protein: 7.9 g/dL (ref 6.5–8.1)

## 2022-01-27 MED ORDER — DILTIAZEM HCL 25 MG/5ML IV SOLN
10.0000 mg | Freq: Once | INTRAVENOUS | Status: AC
  Administered 2022-01-27: 10 mg via INTRAVENOUS
  Filled 2022-01-27: qty 5

## 2022-01-27 NOTE — Discharge Instructions (Signed)
Please follow-up with your doctor as well as your cardiologist and inform them of today's atrial fibrillation event.  Please take all your medications as prescribed this evening by your doctor.  Return to the emergency department for any return of palpitations, chest pain or shortness of breath.

## 2022-01-27 NOTE — ED Triage Notes (Signed)
Pt presents to ED via [facility] with c/o of  CP and some slight SOB. Pt has HX of A-fib, pt takes a blood thinner.   EMS gave '5mg'$  of Metoprolol PTA.

## 2022-01-27 NOTE — ED Provider Notes (Signed)
[facility] Provider Note    Event Date/Time   First MD Initiated Contact with Patient 01/27/22 [phone]     (approximate)  History   Chief Complaint: Chest Pain  HPI  Kristi Barrera is a 81 y.o. female with a past medical history of anxiety, arthritis, gastric reflux, hypertension, hyperlipidemia, presents to the emergency department for atrial fibrillation.  According to the patient over the past 2 years she has been experiencing intermittent atrial fibrillation.  Patient takes verapamil at home.  States for the last year or so she has not had any episodes until today when she awoke this morning with palpitations and a sensation of her heart racing.  Denies any vomiting denies any diaphoresis denies any dyspnea.  No chest pain.  Just a sensation of her heart racing, does state mild nausea this morning.  Physical Exam   Triage Vital Signs: ED Triage Vitals  Enc Vitals Group     BP 01/27/22 0951 (!) 160/91     Pulse Rate 01/27/22 0951 (!) 137     Resp 01/27/22 0951 17     Temp 01/27/22 0951 98.2 F (36.8 C)     Temp Source 01/27/22 0951 Oral     SpO2 01/27/22 0951 99 %     Weight 01/27/22 0954 147 lb (66.7 kg)     Height 01/27/22 0954 '5\' 2"'$  (1.575 m)     Head Circumference --      Peak Flow --      Pain Score 01/27/22 0954 0     Pain Loc --      Pain Edu? --      Excl. in [place]? --     Most recent vital signs: Vitals:   01/27/22 0951  BP: (!) 160/91  Pulse: (!) 137  Resp: 17  Temp: 98.2 F (36.8 C)  SpO2: 99%    General: Awake, no distress.  CV:  Good peripheral perfusion.  Irregular rhythm rate around 130 bpm. Resp:  Normal effort.  Equal breath sounds bilaterally.  Abd:  No distention.  Soft, nontender.  No rebound or guarding.   ED Results / Procedures / Treatments   EKG  EKG viewed and interpreted by myself appear to show atrial fibrillation 113 bpm with a widened QRS, left axis deviation, largely normal intervals nonspecific ST  changes.  Morphology most consistent with left bundle branch block.  I reviewed the patient's previous EKG from 10/28/2021 at that time patient had a left bundle branch block as well.  Repeat EKG viewed and interpreted by myself shows a normal sinus rhythm at 51 bpm with a widened QRS, left axis deviation, largely normal intervals, no concerning ST changes.  Left bundle branch block.  RADIOLOGY  I have viewed and interpreted the chest x-ray images.  I do not see any significant consolidation on my evaluation. Radiology has read the x-ray as chronic interstitial changes without acute abnormality.   MEDICATIONS ORDERED IN ED: Medications  diltiazem (CARDIZEM) injection 10 mg (has no administration in time range)     IMPRESSION / MDM / ASSESSMENT AND PLAN / ED COURSE  I reviewed the triage vital signs and the nursing notes.  Patient's presentation is most consistent with acute presentation with potential threat to life or bodily function.  Patient presents emergency department for palpitations/heart racing starting this morning.  Patient has a history of proximal atrial fibrillation but states has been approximately 1 year since she went into A-fib last.  Patient appears  to be in atrial fibrillation with rapid ventricular response currently around 130 to 140 bpm given 10 mg of IV diltiazem we will continue to closely monitor.  We will check labs including cardiac enzymes chest x-ray and EKG.  Differential would include atrial fibrillation with rapid ventricular response, ACS, other arrhythmia.  Patient appears to have converted back to a normal sinus rhythm current heart rate around 50 to 60 bpm.  I repeated an EKG which shows a normal sinus rhythm with a left bundle branch block which is baseline for the patient.  Patient states she feels much better denies any symptoms.  Initial work-up is reassuring with a normal CBC, reassuring chemistry, negative troponin.  We will repeat a troponin and  continue to closely monitor.  If the patient continues to be in a normal sinus rhythm with a second troponin that was negative anticipate the patient could possibly be discharged home with cardiology follow-up.  Patient's repeat troponin did elevate slightly however I believe this is likely due to demand ischemia.  Patient has no chest complaints, remains in a normal sinus rhythm for several hours at this point.  Patient wishes to go home.  We will discharge home have the patient follow-up with her cardiologist.  Patient agreeable to plan of care.  Discussed return precautions.  CRITICAL CARE Performed by: Harvest Dark   Total critical care time: 30 minutes  Critical care time was exclusive of separately billable procedures and treating other patients.  Critical care was necessary to treat or prevent imminent or life-threatening deterioration.  Critical care was time spent personally by me on the following activities: development of treatment plan with patient and/or surrogate as well as nursing, discussions with consultants, evaluation of patient's response to treatment, examination of patient, obtaining history from patient or surrogate, ordering and performing treatments and interventions, ordering and review of laboratory studies, ordering and review of radiographic studies, pulse oximetry and re-evaluation of patient's condition.   FINAL CLINICAL IMPRESSION(S) / ED DIAGNOSES   Atrial fibrillation with rapid ventricular response   Note:  This document was prepared using Dragon voice recognition software and may include unintentional dictation errors.   Harvest Dark, MD 01/27/22 1419

## 2022-01-27 NOTE — ED Notes (Signed)
Pt A&O, IV removed, pt given discharge instructions, pt assisted to vehicle.

## 2022-01-27 NOTE — ED Notes (Signed)
Pt up and walking to bathroom back into bed at this time. No assistant needed. No other needs at this time.

## 2022-03-17 ENCOUNTER — Other Ambulatory Visit: Payer: Self-pay | Admitting: Internal Medicine

## 2022-03-17 DIAGNOSIS — Z1231 Encounter for screening mammogram for malignant neoplasm of breast: Secondary | ICD-10-CM

## 2022-04-08 ENCOUNTER — Encounter: Payer: Self-pay | Admitting: Ophthalmology

## 2022-04-08 NOTE — Discharge Instructions (Signed)

## 2022-04-10 NOTE — Anesthesia Preprocedure Evaluation (Addendum)
Anesthesia Evaluation  Patient identified by MRN, date of birth, ID band Patient awake    Reviewed: Allergy & Precautions, NPO status , Patient's Chart, lab work & pertinent test results  History of Anesthesia Complications Negative for: history of anesthetic complications  Airway Mallampati: III  TM Distance: >3 FB Neck ROM: full    Dental no notable dental hx.    Pulmonary sleep apnea ,    Pulmonary exam normal        Cardiovascular hypertension, + dysrhythmias (LBBB) Atrial Fibrillation  Rhythm:Regular Rate:Normal - Systolic murmurs and - Diastolic murmurs ECHO 3/41: 1. Left ventricular ejection fraction, by estimation, is 55 to 60%. The  left ventricle has normal function. The left ventricle has no regional  wall motion abnormalities. Left ventricular diastolic parameters are  consistent with Grade I diastolic  dysfunction (impaired relaxation).  2. Right ventricular systolic function is normal. The right ventricular  size is normal.  3. The mitral valve is degenerative. Mild mitral valve regurgitation.  4. The aortic valve is normal in structure. Aortic valve regurgitation is  trivial. No aortic stenosis is present.  5. The inferior vena cava is normal in size with greater than 50%  respiratory variability, suggesting right atrial pressure of 3 mmHg.    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 No date: Anxiety No date: Arthritis No date: Chest pain No date: Chronic low back pain No date: Colitis, chronic, ulcerative (HCC) No date: Complication of  anesthesia     Comment:  slow to wake after colonoscopy No date: Dysrhythmia No date: Family history of adverse reaction to anesthesia     Comment:  family members slow to wake and confusion No date: GERD (gastroesophageal reflux disease) No date: HTN (hypertension) No date: Hyperlipidemia No date: Hypothyroidism No date: LBBB (left bundle branch block) No date: Low blood potassium No date: PAF (paroxysmal atrial fibrillation) (HCC) No date: Palpitations No date: Recurrent UTI No date: Sarcoidosis     Comment:  [place]-63yr ago No date: Sinusitis No date: Skin cancer No date: Sleep apnea     Comment:  cpap with 2 l o2 No date: Thyroid disease No date: Vitamin D deficiency No date: Wears dentures     Comment:  full upper  Past Surgical History: No date: BREAST CYST ASPIRATION; Left     Comment:  neg 09/25/2019: BREAST CYST ASPIRATION; Right 04/06/2017: CATARACT EXTRACTION W/PHACO; Right     Comment:  Procedure: CATARACT EXTRACTION PHACO AND INTRAOCULAR               LENS PLACEMENT (IOC);  Surgeon: PBirder Robson MD;                Location: [facility] ORS;  Service: Ophthalmology;  Laterality:              Right;  [place]:36.1 AP% 18.5 CDE 6.66 fluid Pack lot #               [id number] 08/06/2015: COLONOSCOPY WITH PROPOFOL; N/A     Comment:  Procedure: COLONOSCOPY WITH PROPOFOL;  Surgeon: RGavin Pound  Vira Agar, MD;  Location: [facility] ENDOSCOPY;  Service:               Endoscopy;  Laterality: N/A; 01/15/2016: COLONOSCOPY WITH PROPOFOL; N/A     Comment:  Procedure: COLONOSCOPY WITH PROPOFOL;  Surgeon: Manya Silvas, MD;  Location: [facility] ENDOSCOPY;  Service:               Endoscopy;  Laterality: N/A; No date: Urinary procedure  BMI    Body Mass Index: 26.89 kg/m      Reproductive/Obstetrics negative OB ROS                           Anesthesia Physical Anesthesia Plan  ASA: 3  Anesthesia Plan: MAC   Post-op  Pain Management:    Induction:   PONV Risk Score and Plan:   Airway Management Planned:   Additional Equipment:   Intra-op Plan:   Post-operative Plan:   Informed Consent: I have reviewed the patients History and Physical, chart, labs and discussed the procedure including the risks, benefits and alternatives for the proposed anesthesia with the patient or authorized representative who has indicated his/her understanding and acceptance.       Plan Discussed with: Anesthesiologist, CRNA and Surgeon  Anesthesia Plan Comments:      Anesthesia Quick Evaluation

## 2022-04-12 IMAGING — US US RENAL ARTERY STENOSIS
1 series · 13 of 25 positions shown · non-contrast
Comparison: CTA of the abdomen pelvis - 08/07/2015

CLINICAL DATA: Hypertension.  Evaluate for renal artery stenosis.

EXAM:
RENAL/URINARY TRACT ULTRASOUND
RENAL DUPLEX DOPPLER ULTRASOUND

[Series 1: us renal artery duplex complete · 13 of 87 slices shown]
[im 1/87]
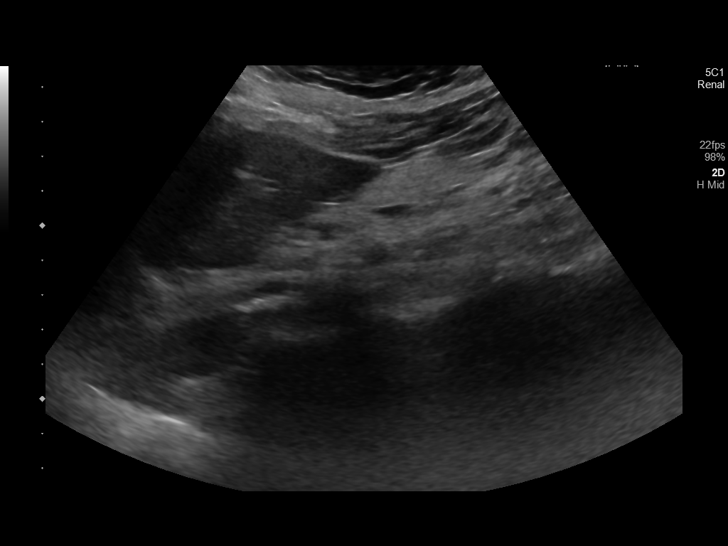
[im 8/87]
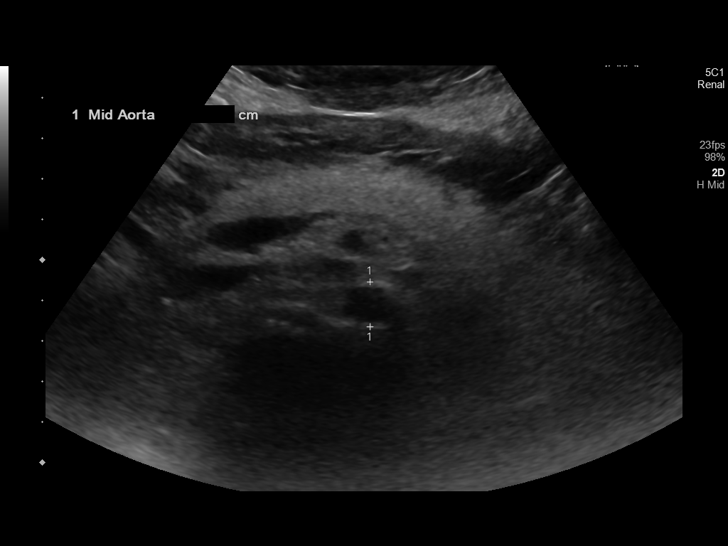
[im 15/87]
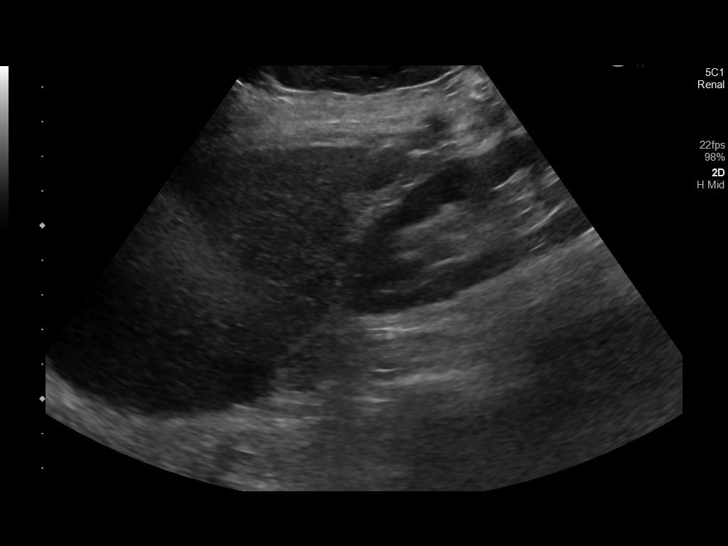
[im 22/87]
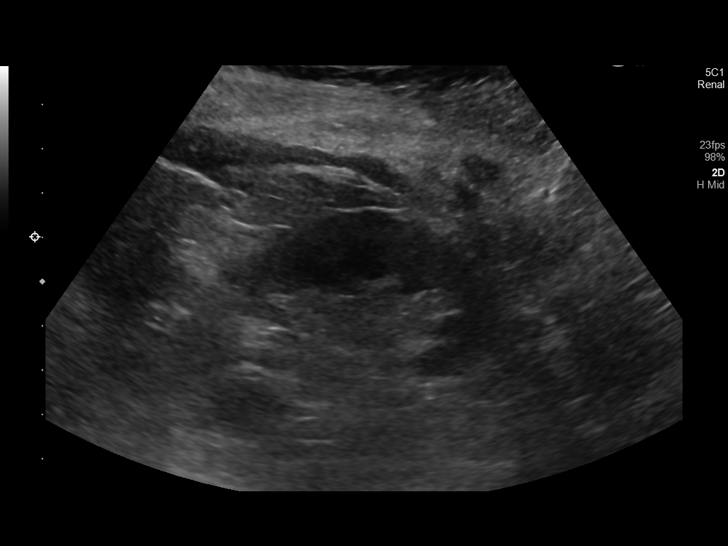
[im 29/87]
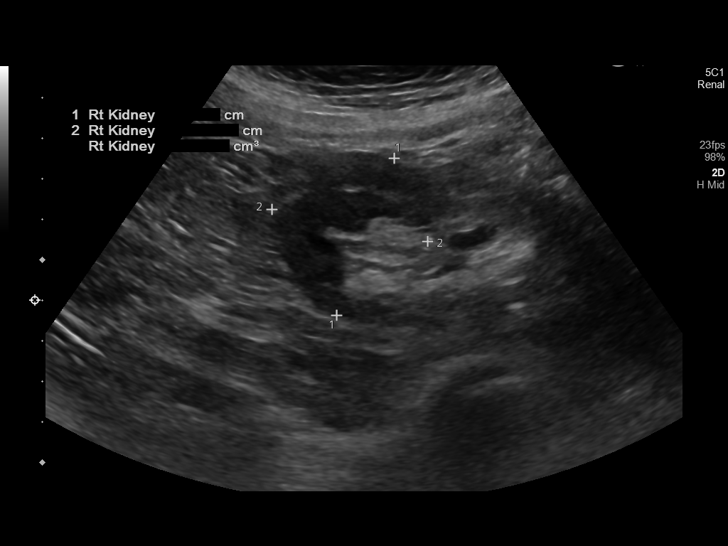
[im 36/87]
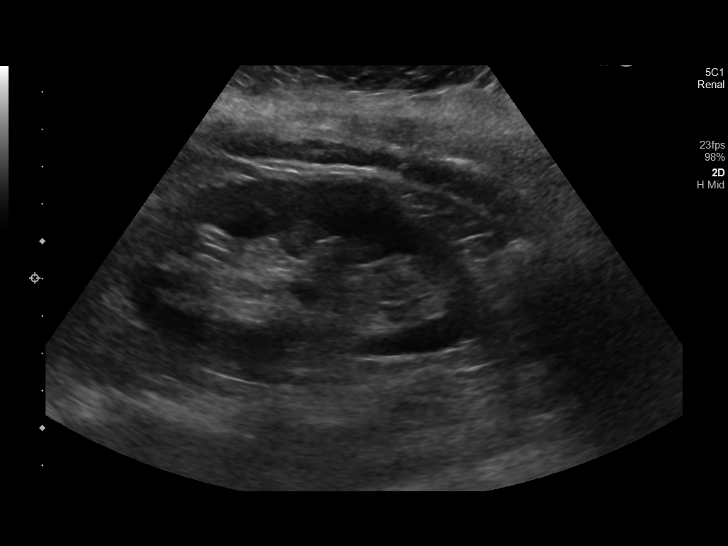
[im 44/87]
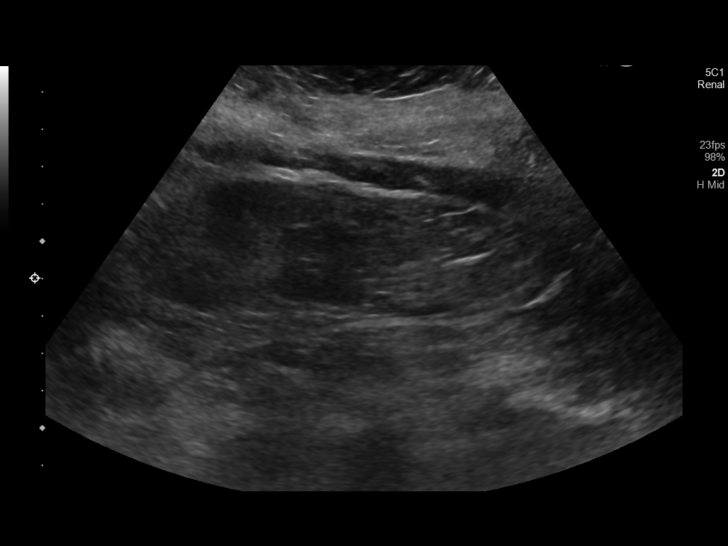
[im 51/87]
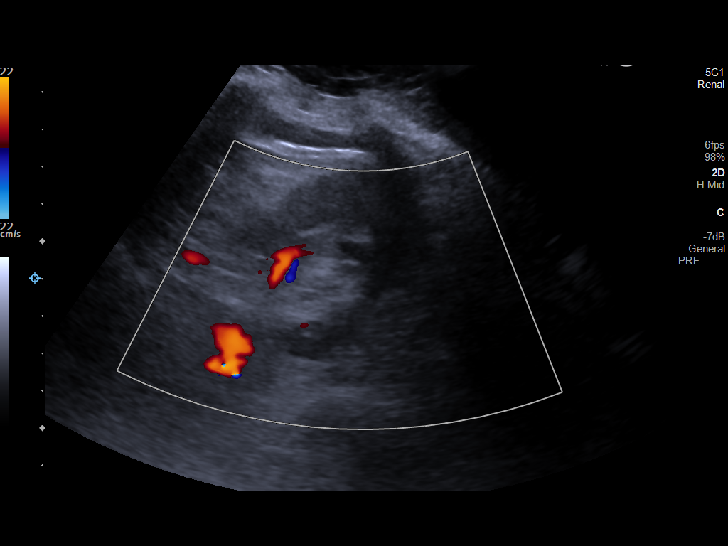
[im 58/87]
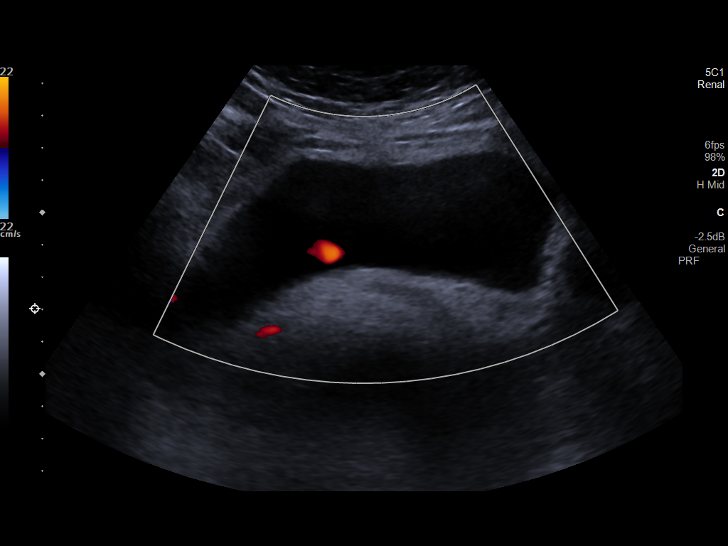
[im 65/87]
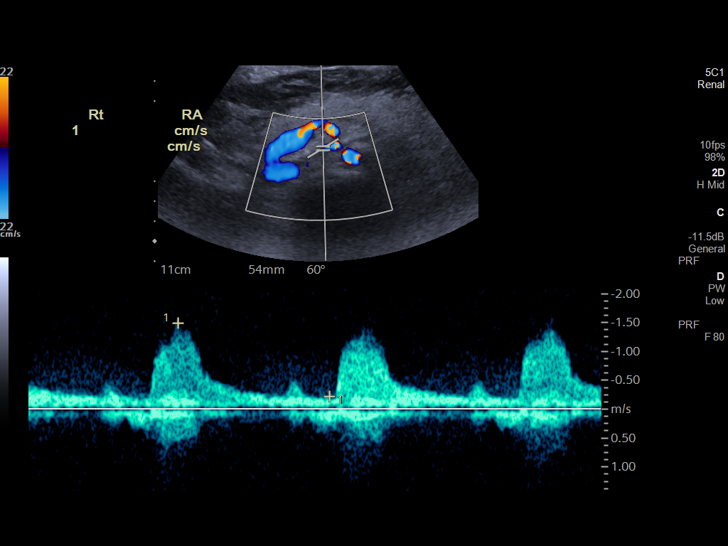
[im 72/87]
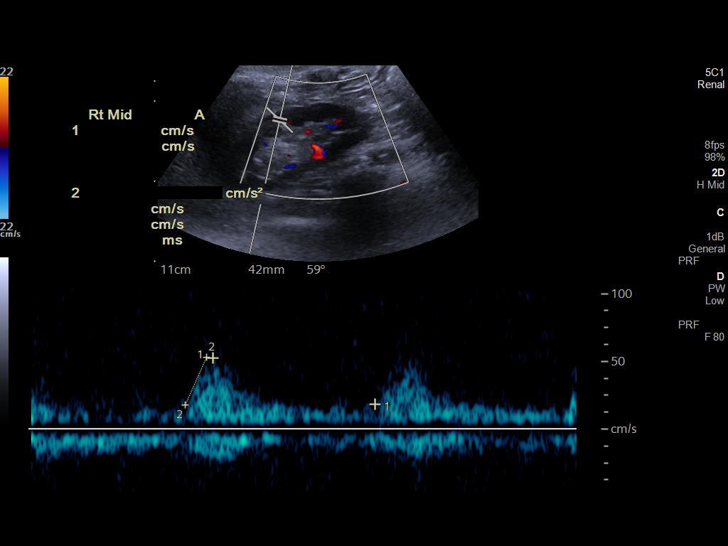
[im 79/87]
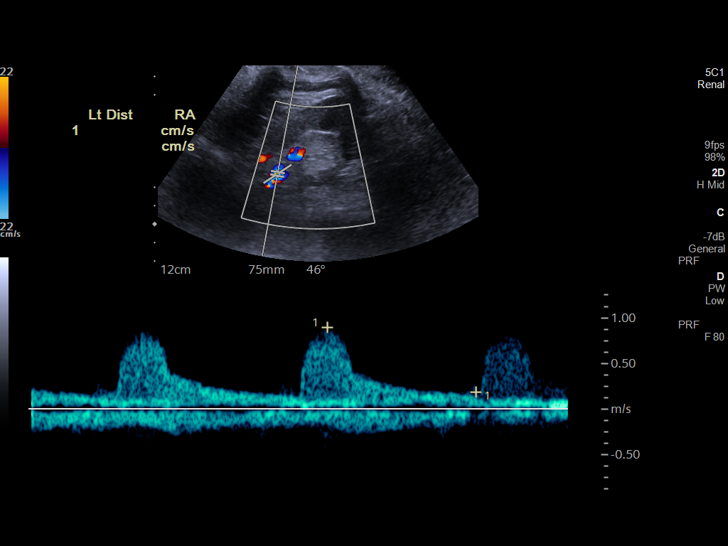
[im 87/87]
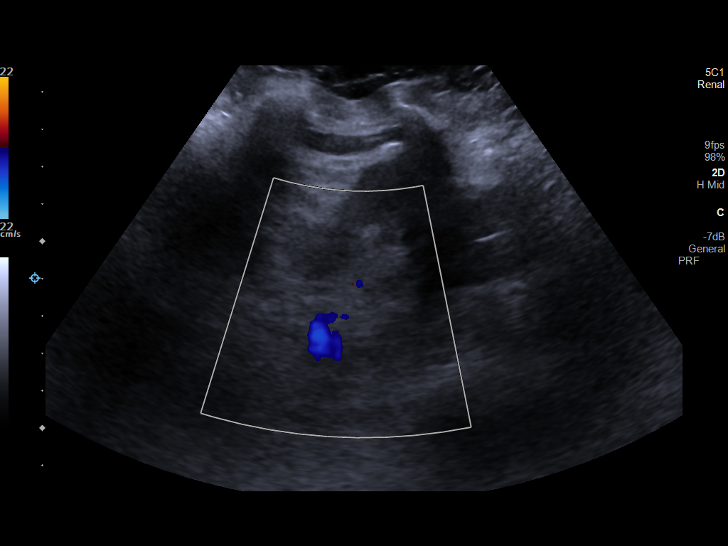

[13 of 25 positions shown; findings below may reference images not displayed]

FINDINGS: Right Kidney:

Normal cortical thickness, echogenicity and size, measuring 9.0 cm
in length. No focal renal lesions. No echogenic renal stones. No
urinary obstruction.

Left Kidney:

Normal cortical thickness, echogenicity and size, measuring 9.5 cm
in length. No focal renal lesions. No echogenic renal stones. No
urinary obstruction.

Bladder:  Appears normal given degree of distention.

_________________________________________________________

RENAL DUPLEX ULTRASOUND

Right Renal Artery Velocities:

Origin:  149 cm/sec

Mid:  232 cm/sec

Hilum:  80 cm/sec

Interlobar:  57 cm/sec

Arcuate:  53 cm/sec

Left Renal Artery Velocities:

Origin:  158 cm/sec

Mid:  165 cm/sec

Hilum:  90 cm/sec

Interlobar:  107 cm/sec

Arcuate:  44 cm/sec

Aortic Velocity:  209 cm/sec

Right Renal-Aortic Ratios:

Origin:

Mid:

Hilum:

Interlobar:

Arcuate:

Left Renal-Aortic Ratios:

Origin:

Mid:

Hilum:

Interlobar:

Arcuate:

Isolated elevated peak systolic velocity involving the mid aspect of
the right renal artery (image 67), is without abnormal right renal
artery/aortic ratio.

Otherwise, normal velocities and low resistance waveforms are
demonstrated throughout the interrogated portions of the bilateral
renal arteries and renal parenchyma. The bilateral renal veins
appear patent.

Scattered mixed echogenic plaque within a normal caliber abdominal
aorta.
IMPRESSION: 1. Isolated elevated peak systolic velocity within the mid aspect of
the right renal artery is without associated abnormal right renal
artery/aortic ratio or associated asymmetric renal atrophy, favored
to be technique related though could be seen in the setting of
early/mild renal artery stenosis. If clinical concern persists,
further evaluation with CTA of the abdomen pelvis could be performed
as indicated.
2. Otherwise, unremarkable renal artery ultrasound.
3. Aortic Atherosclerosis ([id number].D).

## 2022-04-13 ENCOUNTER — Encounter: Payer: Self-pay | Admitting: Ophthalmology

## 2022-04-13 ENCOUNTER — Ambulatory Visit (AMBULATORY_SURGERY_CENTER): Payer: Medicare Other | Admitting: Anesthesiology

## 2022-04-13 ENCOUNTER — Ambulatory Visit
Admission: RE | Admit: 2022-04-13 | Discharge: 2022-04-13 | Disposition: A | Discharge status: Home / Self Care | Payer: Medicare Other | Attending: Ophthalmology | Admitting: Ophthalmology

## 2022-04-13 ENCOUNTER — Encounter
Admission: RE | Disposition: A | Discharge status: Home / Self Care | Payer: Self-pay | Source: Home / Self Care | Attending: Ophthalmology

## 2022-04-13 ENCOUNTER — Ambulatory Visit: Payer: Medicare Other | Admitting: Anesthesiology

## 2022-04-13 ENCOUNTER — Other Ambulatory Visit: Payer: Self-pay

## 2022-04-13 DIAGNOSIS — E785 Hyperlipidemia, unspecified: Secondary | ICD-10-CM | POA: Insufficient documentation

## 2022-04-13 DIAGNOSIS — I340 Nonrheumatic mitral (valve) insufficiency: Secondary | ICD-10-CM | POA: Insufficient documentation

## 2022-04-13 DIAGNOSIS — I447 Left bundle-branch block, unspecified: Secondary | ICD-10-CM

## 2022-04-13 DIAGNOSIS — I10 Essential (primary) hypertension: Secondary | ICD-10-CM

## 2022-04-13 DIAGNOSIS — M545 Low back pain, unspecified: Secondary | ICD-10-CM | POA: Insufficient documentation

## 2022-04-13 DIAGNOSIS — K219 Gastro-esophageal reflux disease without esophagitis: Secondary | ICD-10-CM | POA: Insufficient documentation

## 2022-04-13 DIAGNOSIS — H2512 Age-related nuclear cataract, left eye: Secondary | ICD-10-CM

## 2022-04-13 DIAGNOSIS — G4730 Sleep apnea, unspecified: Secondary | ICD-10-CM | POA: Diagnosis not present

## 2022-04-13 DIAGNOSIS — G8929 Other chronic pain: Secondary | ICD-10-CM | POA: Diagnosis not present

## 2022-04-13 DIAGNOSIS — I480 Paroxysmal atrial fibrillation: Secondary | ICD-10-CM | POA: Insufficient documentation

## 2022-04-13 DIAGNOSIS — E039 Hypothyroidism, unspecified: Secondary | ICD-10-CM | POA: Insufficient documentation

## 2022-04-13 DIAGNOSIS — M1990 Unspecified osteoarthritis, unspecified site: Secondary | ICD-10-CM | POA: Insufficient documentation

## 2022-04-13 HISTORY — DX: Other complications of anesthesia, initial encounter: T88.59XA

## 2022-04-13 HISTORY — DX: Paroxysmal atrial fibrillation: I48.0

## 2022-04-13 HISTORY — DX: Presence of dental prosthetic device (complete) (partial): Z97.2

## 2022-04-13 HISTORY — DX: Family history of other specified conditions: Z84.89

## 2022-04-13 HISTORY — PX: CATARACT EXTRACTION W/PHACO: SHX586

## 2022-04-13 HISTORY — DX: Left bundle-branch block, unspecified: I44.7

## 2022-04-13 SURGERY — PHACOEMULSIFICATION, CATARACT, WITH IOL INSERTION
Anesthesia: Monitor Anesthesia Care | Site: Eye | Laterality: Left

## 2022-04-13 MED ORDER — MOXIFLOXACIN HCL 0.5 % OP SOLN
OPHTHALMIC | Status: DC | PRN
  Administered 2022-04-13: 0.2 mL via OPHTHALMIC

## 2022-04-13 MED ORDER — SIGHTPATH DOSE#1 BSS IO SOLN
INTRAOCULAR | Status: DC | PRN
  Administered 2022-04-13: 71 mL via OPHTHALMIC

## 2022-04-13 MED ORDER — SIGHTPATH DOSE#1 NA HYALUR & NA CHOND-NA HYALUR IO KIT
PACK | INTRAOCULAR | Status: DC | PRN
  Administered 2022-04-13: 1 via OPHTHALMIC

## 2022-04-13 MED ORDER — MIDAZOLAM HCL 2 MG/2ML IJ SOLN
INTRAMUSCULAR | Status: DC | PRN
  Administered 2022-04-13: 2 mg via INTRAVENOUS

## 2022-04-13 MED ORDER — FENTANYL CITRATE (PF) 100 MCG/2ML IJ SOLN
INTRAMUSCULAR | Status: DC | PRN
  Administered 2022-04-13: 100 ug via INTRAVENOUS

## 2022-04-13 MED ORDER — SIGHTPATH DOSE#1 BSS IO SOLN
INTRAOCULAR | Status: DC | PRN
  Administered 2022-04-13: 15 mL

## 2022-04-13 MED ORDER — ARMC OPHTHALMIC DILATING DROPS
1.0000 | OPHTHALMIC | Status: DC | PRN
  Administered 2022-04-13 (×3): 1 via OPHTHALMIC

## 2022-04-13 MED ORDER — TETRACAINE HCL 0.5 % OP SOLN
1.0000 [drp] | OPHTHALMIC | Status: DC | PRN
  Administered 2022-04-13 (×3): 1 [drp] via OPHTHALMIC

## 2022-04-13 MED ORDER — LIDOCAINE HCL (PF) 2 % IJ SOLN
INTRAOCULAR | Status: DC | PRN
  Administered 2022-04-13: 1 mL via INTRAOCULAR

## 2022-04-13 SURGICAL SUPPLY — 13 items
CATARACT SUITE SIGHTPATH (MISCELLANEOUS) ×1 IMPLANT
DISSECTOR HYDRO NUCLEUS 50X22 (MISCELLANEOUS) ×1 IMPLANT
FEE CATARACT SUITE SIGHTPATH (MISCELLANEOUS) ×1 IMPLANT
GLOVE SURG GAMMEX PI TX LF 7.5 (GLOVE) ×1 IMPLANT
GLOVE SURG SYN 8.5  E (GLOVE) ×1
GLOVE SURG SYN 8.5 E (GLOVE) ×1 IMPLANT
GLOVE SURG SYN 8.5 PF PI (GLOVE) ×1 IMPLANT
LENS IOL TECNIS EYHANCE 22.0 (Intraocular Lens) IMPLANT
NDL FILTER BLUNT 18X1 1/2 (NEEDLE) ×1 IMPLANT
NEEDLE FILTER BLUNT 18X1 1/2 (NEEDLE) ×1 IMPLANT
SYR 3ML LL SCALE MARK (SYRINGE) ×1 IMPLANT
SYR 5ML LL (SYRINGE) ×1 IMPLANT
WATER STERILE IRR 250ML POUR (IV SOLUTION) ×1 IMPLANT

## 2022-04-13 NOTE — Op Note (Signed)
OPERATIVE NOTE  Kristi Barrera [id number] 04/13/2022   PREOPERATIVE DIAGNOSIS:  Nuclear sclerotic cataract left eye.  H25.12   POSTOPERATIVE DIAGNOSIS:    Nuclear sclerotic cataract left eye.     PROCEDURE:  Phacoemusification with posterior chamber intraocular lens placement of the left eye   LENS:   Implant Name Type Inv. Item Serial No. Manufacturer Lot No. LRB No. Used Action  LENS IOL TECNIS EYHANCE 22.0 - [id number] Intraocular Lens LENS IOL TECNIS EYHANCE 22.0 [id number] [employer]  Left 1 Implanted      Procedure(s) with comments: CATARACT EXTRACTION PHACO AND INTRAOCULAR LENS PLACEMENT (IOC) LEFT 8.82 00:40.8 (Left) - sleep apnea  DIB00 +22.0   ULTRASOUND TIME: 0 minutes 40 seconds.  CDE 8.82   SURGEON:  Benay Pillow, MD, MPH   ANESTHESIA:  Topical with tetracaine drops augmented with 1% preservative-free intracameral lidocaine.  ESTIMATED BLOOD LOSS: <1 mL   COMPLICATIONS:  None.   DESCRIPTION OF PROCEDURE:  The patient was identified in the holding room and transported to the operating room and placed in the supine position under the operating microscope.  The left eye was identified as the operative eye and it was prepped and draped in the usual sterile ophthalmic fashion.   A 1.0 millimeter clear-corneal paracentesis was made at the 5:00 position. 0.5 ml of preservative-free 1% lidocaine with epinephrine was injected into the anterior chamber.  The anterior chamber was filled with viscoelastic.  A 2.4 millimeter keratome was used to make a near-clear corneal incision at the 2:00 position.  A curvilinear capsulorrhexis was made with a cystotome and capsulorrhexis forceps.  Balanced salt solution was used to hydrodissect and hydrodelineate the nucleus.   Phacoemulsification was then used in stop and chop fashion to remove the lens nucleus and epinucleus.  The remaining cortex was then removed using the irrigation and aspiration handpiece. Viscoelastic was then  placed into the capsular bag to distend it for lens placement.  A lens was then injected into the capsular bag.  The remaining viscoelastic was aspirated.   Wounds were hydrated with balanced salt solution.  The anterior chamber was inflated to a physiologic pressure with balanced salt solution.  Intracameral vigamox 0.1 mL undiltued was injected into the eye and a drop placed onto the ocular surface.  No wound leaks were noted.  The patient was taken to the recovery room in stable condition without complications of anesthesia or surgery  Benay Pillow 04/13/2022, 8:48 AM

## 2022-04-13 NOTE — H&P (Signed)
[facility]   Primary Care Physician:  Idelle Crouch, MD Ophthalmologist: Dr. Benay Pillow  Pre-Procedure History & Physical: HPI:  Kristi Barrera is a 81 y.o. female here for cataract surgery.   Past Medical History:  Diagnosis Date   Abnormal cardiac valve    LEAKING HEART VALVE   Anxiety    Arthritis    Chest pain    Chronic low back pain    Colitis, chronic, ulcerative (HCC)    Complication of anesthesia    slow to wake after colonoscopy   Dysrhythmia    Family history of adverse reaction to anesthesia    family members slow to wake and confusion   GERD (gastroesophageal reflux disease)    HTN (hypertension)    Hyperlipidemia    Hypothyroidism    LBBB (left bundle branch block)    Low blood potassium    PAF (paroxysmal atrial fibrillation) (HCC)    Palpitations    Recurrent UTI    Sarcoidosis    [facility] 15-71yr ago   Sinusitis    Skin cancer    Sleep apnea    cpap with 2 l o2   Thyroid disease    Vitamin D deficiency    Wears dentures    full upper    Past Surgical History:  Procedure Laterality Date   BREAST CYST ASPIRATION Left    neg   BREAST CYST ASPIRATION Right 09/25/2019   CATARACT EXTRACTION W/PHACO Right 04/06/2017   Procedure: CATARACT EXTRACTION PHACO AND INTRAOCULAR LENS PLACEMENT ([facility];  Surgeon: PBirder Robson MD;  Location: [facility] ORS;  Service: Ophthalmology;  Laterality: Right;  [place]00:36.1 AP% 18.5 CDE 6.66 fluid Pack lot # [id number]   COLONOSCOPY WITH PROPOFOL N/A 08/06/2015   Procedure: COLONOSCOPY WITH PROPOFOL;  Surgeon: RManya Silvas MD;  [facility]  Service: Endoscopy;  Laterality: N/A;   COLONOSCOPY WITH PROPOFOL N/A 01/15/2016   Procedure: COLONOSCOPY WITH PROPOFOL;  Surgeon: RManya Silvas MD;  [facility];  Service: Endoscopy;  Laterality: N/A;   Urinary procedure      Prior to Admission medications   Medication Sig Start Date End Date Taking? Authorizing  Provider  acetaminophen (TYLENOL) 500 MG tablet Take 2 tablets (1,000 mg total) by mouth every 6 (six) hours as needed for moderate pain. 07/20/21 07/20/22 Yes IDuffy Bruce MD  ALPRAZolam (Duanne Moron 0.25 MG tablet Take 0.125-0.25 mg by mouth See admin instructions. Take 0.'125mg'$  in the morning and 0.'25mg'$  at bedtime.  May take an additional 0.'25mg'$  as needed for anxiety.   Yes [provider]  calcium carbonate (TUMS - DOSED IN MG ELEMENTAL CALCIUM) 500 MG chewable tablet Chew 1-2 tablets by mouth 3 (three) times daily as needed for indigestion or heartburn.   Yes [provider]  dicyclomine (BENTYL) 20 MG tablet Take 10 mg by mouth 2 (two) times daily. 07/24/21  Yes [provider]  ELIQUIS 5 MG TABS tablet Take 1 tablet by mouth 2 (two) times daily. 07/02/21  Yes [provider]  isosorbide mononitrate (IMDUR) 60 MG 24 hr tablet Take 60 mg by mouth 2 (two) times daily. 07/24/21  Yes [provider]  Polyethyl Glycol-Propyl Glycol (SYSTANE FREE OP) Place 1 drop into both eyes 3 (three) times daily as needed (dryness).   Yes [provider]  spironolactone (ALDACTONE) 25 MG tablet Take 0.5 tablets (12.5 mg total) by mouth daily. 08/02/21 04/13/22 Yes WWyvonnia Dusky MD  SYNTHROID 100 MCG tablet Take 100 mcg  by mouth daily before breakfast. 06/08/21  Yes [provider]  valsartan (DIOVAN) 160 MG tablet Take 160 mg by mouth 2 (two) times daily. 06/21/21  Yes [provider]  verapamil (CALAN-SR) 120 MG CR tablet Take 120 mg by mouth daily. 10/13/21  Yes [provider]  Vitamin D, Ergocalciferol, (DRISDOL) 1.25 MG (50000 UNIT) CAPS capsule Take 50,000 Units by mouth 2 (two) times a week. 04/29/21  Yes [provider]    Allergies as of 03/30/2022 - Review Complete 01/27/2022  Allergen Reaction Noted   Iohexol Shortness Of Breath 11/17/2011   Sulfa antibiotics Anaphylaxis and Swelling 11/16/2011   Clonidine derivatives   10/04/2013   Floxin [ofloxacin] Other (See Comments) 03/03/2015   Hydralazine hcl  11/17/2011   Lotrel [amlodipine besy-benazepril hcl] Itching 11/16/2011   Potassium-containing compounds Other (See Comments) 08/02/2015   Protonix [pantoprazole sodium] Swelling 04/06/2018   Toprol xl [metoprolol succinate]  11/16/2011    Family History  Problem Relation Age of Onset   Heart disease Father        MI   Stroke Father    Pneumonia Father    Hypertension Father    Stroke Mother    Rheum arthritis Mother    Breast cancer Neg Hx     Social History   Socioeconomic History   Marital status: Married    Spouse name: Not on file   Number of children: Not on file   Years of education: Not on file   Highest education level: Not on file  Occupational History   Occupation: retired  Tobacco Use   Smoking status: Never   Smokeless tobacco: Never  Vaping Use   Vaping Use: Never used  Substance and Sexual Activity   Alcohol use: No   Drug use: No   Sexual activity: Not on file  Other Topics Concern   Not on file  Social History Narrative   Not on file   Social Determinants of Health   Financial Resource Strain: Not on file  Food Insecurity: Not on file  Transportation Needs: Not on file  Physical Activity: Not on file  Stress: Not on file  Social Connections: Not on file  Intimate Partner Violence: Not on file    Review of Systems: See HPI, otherwise negative ROS  Physical Exam: BP (!) 178/66   Pulse 69   Temp (!) 97.5 F (36.4 C)   Ht '5\' 2"'$  (1.575 m)   Wt 67.3 kg   SpO2 100%   BMI 27.14 kg/m  General:   Alert, cooperative in NAD Head:  Normocephalic and atraumatic. Respiratory:  Normal work of breathing. Cardiovascular:  RRR  Impression/Plan: Kristi Barrera is here for cataract surgery.  Risks, benefits, limitations, and alternatives regarding cataract surgery have been reviewed with the patient.  Questions have been answered.  All parties  agreeable.   Benay Pillow, MD  04/13/2022, 8:22 AM

## 2022-04-13 NOTE — Transfer of Care (Signed)
Immediate Anesthesia Transfer of Care Note  Patient: Kristi Barrera  Procedure(s) Performed: CATARACT EXTRACTION PHACO AND INTRAOCULAR LENS PLACEMENT (IOC) LEFT 8.82 00:40.8 (Left: Eye)  Patient Location: PACU  Anesthesia Type: MAC  Level of Consciousness: awake, alert  and patient cooperative  Airway and Oxygen Therapy: Patient Spontanous Breathing and Patient connected to supplemental oxygen  Post-op Assessment: Post-op Vital signs reviewed, Patient's Cardiovascular Status Stable, Respiratory Function Stable, Patent Airway and No signs of Nausea or vomiting  Post-op Vital Signs: Reviewed and stable  Complications: No notable events documented.

## 2022-04-13 NOTE — Anesthesia Postprocedure Evaluation (Signed)
Anesthesia Post Note  Patient: Kristi Barrera  Procedure(s) Performed: CATARACT EXTRACTION PHACO AND INTRAOCULAR LENS PLACEMENT (IOC) LEFT 8.82 00:40.8 (Left: Eye)     Patient location during evaluation: PACU Anesthesia Type: MAC Level of consciousness: awake and alert Pain management: pain level controlled Vital Signs Assessment: post-procedure vital signs reviewed and stable Respiratory status: spontaneous breathing, nonlabored ventilation and respiratory function stable Cardiovascular status: blood pressure returned to baseline and stable Postop Assessment: no apparent nausea or vomiting Anesthetic complications: no   No notable events documented.  [place] Ouch

## 2022-04-15 ENCOUNTER — Encounter: Payer: Self-pay | Admitting: Ophthalmology

## 2022-04-27 ENCOUNTER — Ambulatory Visit
Admission: RE | Admit: 2022-04-27 | Discharge: 2022-04-27 | Disposition: A | Discharge status: Home / Self Care | Payer: Medicare Other | Source: Ambulatory Visit | Attending: Internal Medicine | Admitting: Internal Medicine

## 2022-04-27 DIAGNOSIS — Z1231 Encounter for screening mammogram for malignant neoplasm of breast: Secondary | ICD-10-CM | POA: Diagnosis present

## 2022-07-10 IMAGING — CR DG CHEST 2V
1 series · 2 of 2 positions shown · non-contrast
Comparison: 07/30/2021

CLINICAL DATA: Atrial fibrillation, palpitation, chest pain,
sarcoidosis

EXAM:
CHEST - 2 VIEW

[Series 1: dg chest 2 view · 0.14mm/px · 2 of 2 slices shown]
[im 1/2]
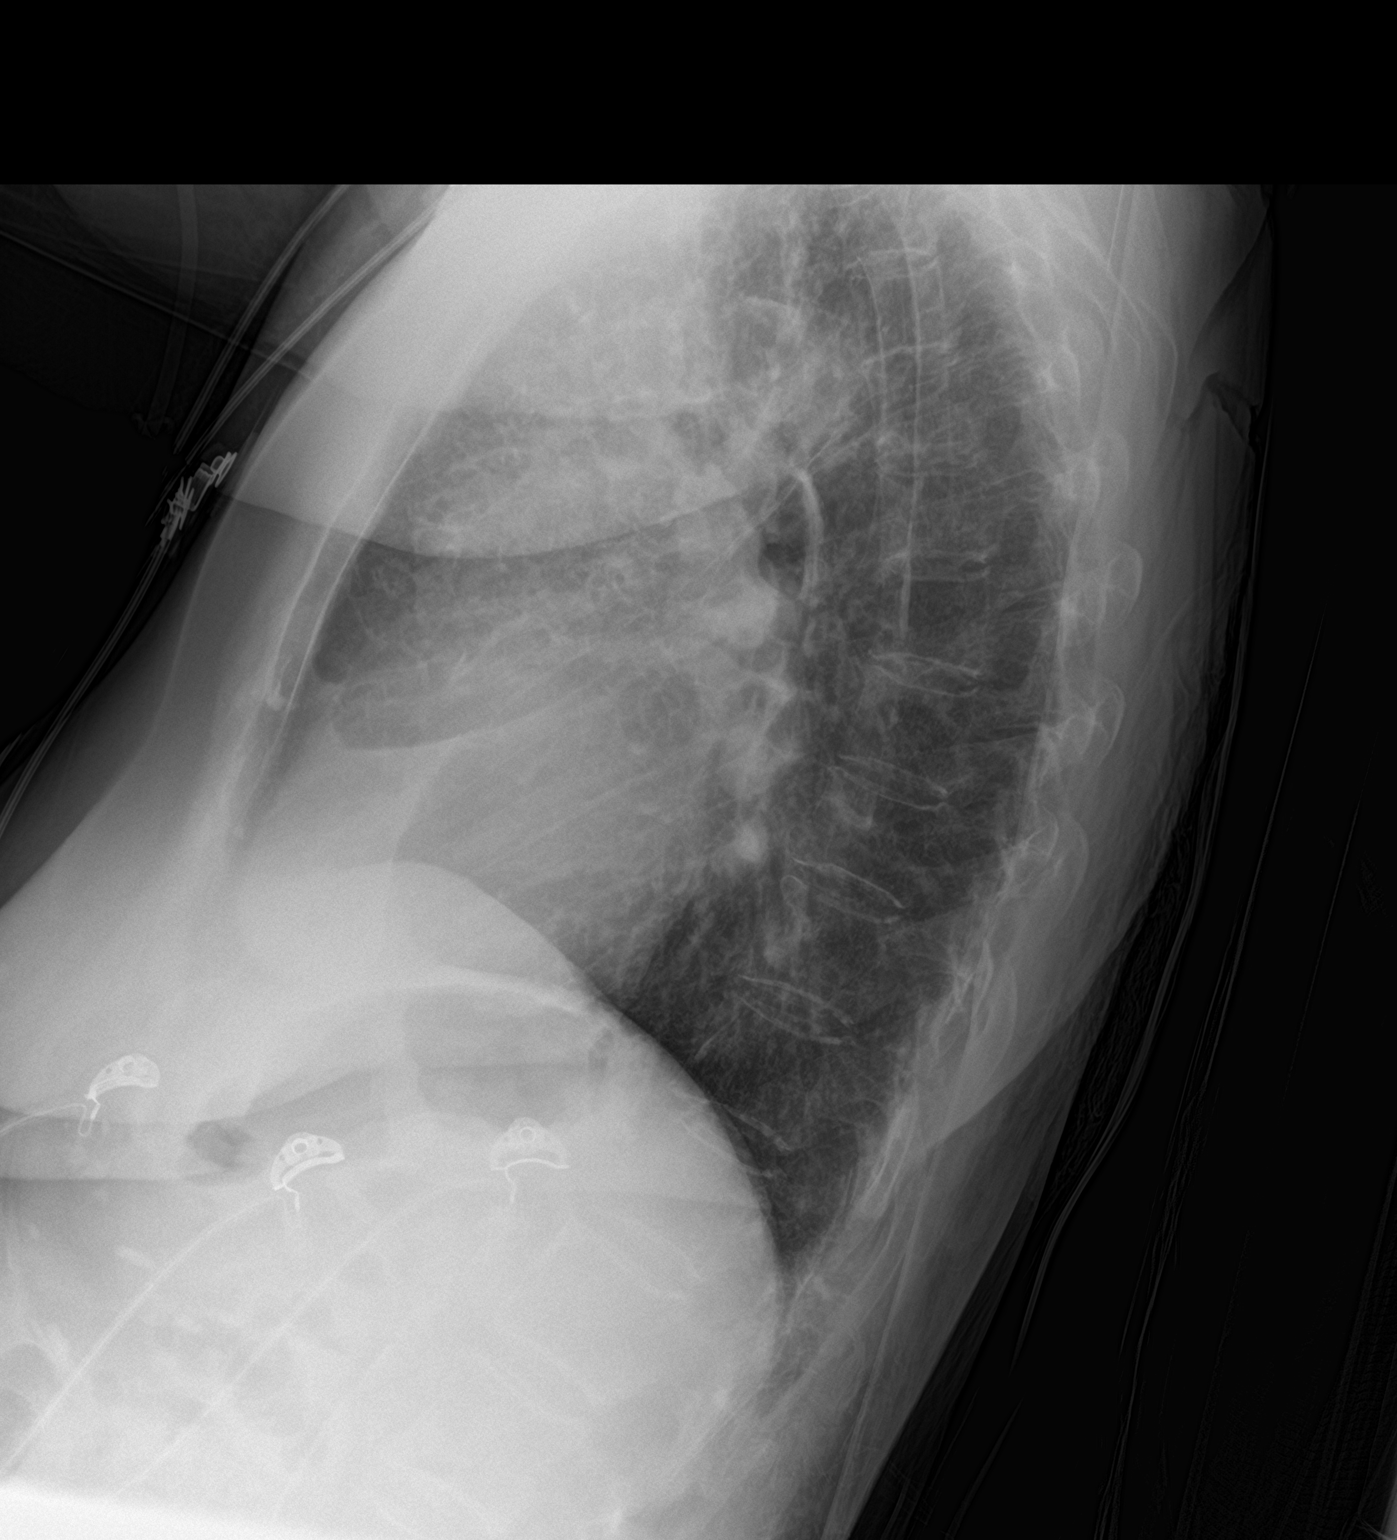
[im 2/2]
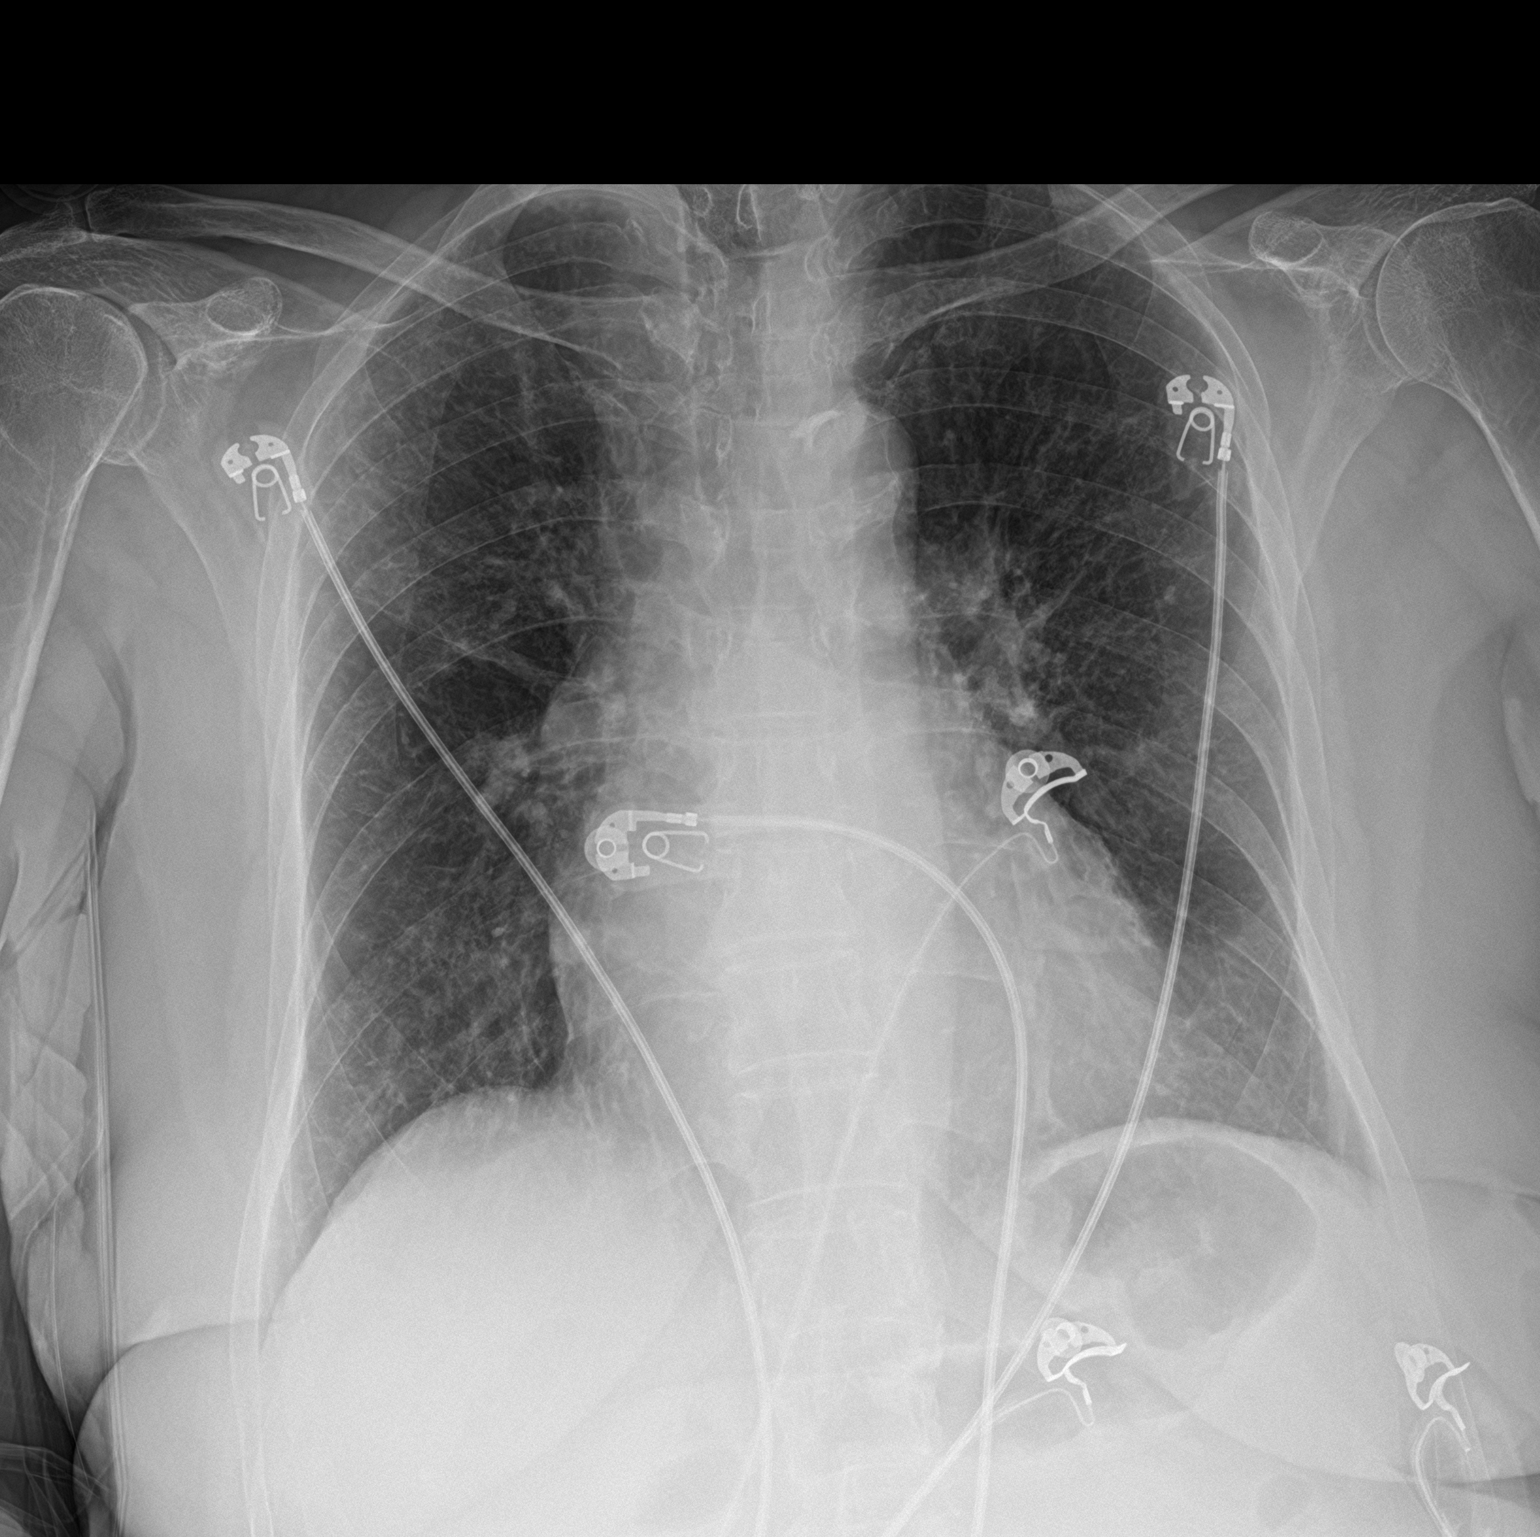

[2 of 2 positions shown; findings below may reference images not displayed]

FINDINGS: Stable cardiomegaly and normal central vascularity.

Similar chronic appearing reticulonodular interstitial opacities
more pronounced in the upper lobes compatible with background
chronic lung disease which can be seen with sarcoidosis. No
superimposed acute pneumonia, collapse or consolidation. Negative
for edema, effusion or pneumothorax. Trachea midline. Aorta
atherosclerotic. Bones are osteopenic.
IMPRESSION: Stable cardiomegaly and chronic lung disease compatible with history
of sarcoidosis.

No superimposed acute process.

Aortic Atherosclerosis ([id number]).

## 2022-10-29 ENCOUNTER — Emergency Department: Payer: Medicare Other

## 2022-10-29 ENCOUNTER — Emergency Department
Admission: EM | Admit: 2022-10-29 | Discharge: 2022-10-29 | Disposition: A | Discharge status: Home / Self Care | Payer: Medicare Other | Attending: Emergency Medicine | Admitting: Emergency Medicine

## 2022-10-29 ENCOUNTER — Other Ambulatory Visit: Payer: Self-pay

## 2022-10-29 DIAGNOSIS — I480 Paroxysmal atrial fibrillation: Secondary | ICD-10-CM | POA: Diagnosis not present

## 2022-10-29 DIAGNOSIS — R002 Palpitations: Secondary | ICD-10-CM | POA: Diagnosis present

## 2022-10-29 DIAGNOSIS — Z7901 Long term (current) use of anticoagulants: Secondary | ICD-10-CM | POA: Insufficient documentation

## 2022-10-29 DIAGNOSIS — Z79899 Other long term (current) drug therapy: Secondary | ICD-10-CM | POA: Insufficient documentation

## 2022-10-29 DIAGNOSIS — E039 Hypothyroidism, unspecified: Secondary | ICD-10-CM | POA: Insufficient documentation

## 2022-10-29 DIAGNOSIS — I4891 Unspecified atrial fibrillation: Secondary | ICD-10-CM

## 2022-10-29 LAB — TSH: TSH: 1.462 u[IU]/mL (ref 0.350–4.500)

## 2022-10-29 LAB — BASIC METABOLIC PANEL
Anion gap: 8 (ref 5–15)
BUN: 17 mg/dL (ref 8–23)
CO2: 24 mmol/L (ref 22–32)
Calcium: 9.5 mg/dL (ref 8.9–10.3)
Chloride: 102 mmol/L (ref 98–111)
Creatinine, Ser: 1.05 mg/dL — ABNORMAL HIGH (ref 0.44–1.00)
GFR, Estimated: 54 mL/min — ABNORMAL LOW (ref >60)
Glucose, Bld: 101 mg/dL — ABNORMAL HIGH (ref 70–99)
Potassium: 3.9 mmol/L (ref 3.5–5.1)
Sodium: 134 mmol/L — ABNORMAL LOW (ref 135–145)

## 2022-10-29 LAB — CBC
HCT: 38.5 % (ref 36.0–46.0)
Hemoglobin: 13.2 g/dL (ref 12.0–15.0)
MCH: 30.7 pg (ref 26.0–34.0)
MCHC: 34.3 g/dL (ref 30.0–36.0)
MCV: 89.5 fL (ref 80.0–100.0)
Platelets: 296 10*3/uL (ref 150–400)
RBC: 4.3 MIL/uL (ref 3.87–5.11)
RDW: 11.4 % — ABNORMAL LOW (ref 11.5–15.5)
WBC: 5.1 10*3/uL (ref 4.0–10.5)
nRBC: 0 % (ref 0.0–0.2)

## 2022-10-29 LAB — MAGNESIUM: Magnesium: 1.7 mg/dL (ref 1.7–2.4)

## 2022-10-29 LAB — T4, FREE: Free T4: 1.28 ng/dL — ABNORMAL HIGH (ref 0.61–1.12)

## 2022-10-29 LAB — TROPONIN I (HIGH SENSITIVITY): Troponin I (High Sensitivity): 6 ng/L (ref <18)

## 2022-10-29 MED ORDER — APIXABAN 5 MG PO TABS
5.0000 mg | ORAL_TABLET | Freq: Once | ORAL | Status: AC
  Administered 2022-10-29: 5 mg via ORAL
  Filled 2022-10-29: qty 1

## 2022-10-29 MED ORDER — LEVOTHYROXINE SODIUM 50 MCG PO TABS
100.0000 ug | ORAL_TABLET | Freq: Once | ORAL | Status: AC
  Administered 2022-10-29: 100 ug via ORAL
  Filled 2022-10-29: qty 2

## 2022-10-29 MED ORDER — METOPROLOL TARTRATE 5 MG/5ML IV SOLN
5.0000 mg | Freq: Once | INTRAVENOUS | Status: AC
  Administered 2022-10-29: 5 mg via INTRAVENOUS

## 2022-10-29 MED ORDER — METOPROLOL TARTRATE 5 MG/5ML IV SOLN
5.0000 mg | Freq: Once | INTRAVENOUS | Status: AC
  Administered 2022-10-29: 5 mg via INTRAVENOUS
  Filled 2022-10-29: qty 5

## 2022-10-29 MED ORDER — ALPRAZOLAM 0.5 MG PO TABS: 0.2500 mg | ORAL_TABLET | Freq: Three times a day (TID) | ORAL | Status: DC | PRN

## 2022-10-29 MED ORDER — ISOSORBIDE MONONITRATE ER 60 MG PO TB24
60.0000 mg | ORAL_TABLET | Freq: Every day | ORAL | Status: DC
  Administered 2022-10-29: 60 mg via ORAL
  Filled 2022-10-29: qty 1

## 2022-10-29 MED ORDER — SODIUM CHLORIDE 0.9 % IV BOLUS
500.0000 mL | Freq: Once | INTRAVENOUS | Status: AC
  Administered 2022-10-29: 500 mL via INTRAVENOUS

## 2022-10-29 MED ORDER — METOPROLOL TARTRATE 25 MG PO TABS
25.0000 mg | ORAL_TABLET | Freq: Once | ORAL | Status: AC
  Administered 2022-10-29: 25 mg via ORAL
  Filled 2022-10-29: qty 1

## 2022-10-29 NOTE — Discharge Instructions (Signed)
See your heart doctor for follow-up appointment, give them a call today to schedule.  If you develop palpitations with a sensation of rapid heartbeat, take your metoprolol as needed as directed by your heart doctor.  Thank you for choosing [place] for your health care today!  Please see your primary doctor this week for a follow up appointment.   Sometimes, in the early stages of certain disease courses it is difficult to detect in the emergency department evaluation -- so, it is important that you continue to monitor your symptoms and call your doctor right away or return to the emergency department if you develop any new or worsening symptoms.  Please go to the following website to schedule new (and existing) patient appointments:   [email or web address]  If you do not have a primary doctor try calling the following clinics to establish care:  If you have insurance:  [facility] [phone] [address] [place] [place] [address]   [facility]  [phone] [address] [place] [place] [address]   If you do not have insurance:  [facility]  [phone] [address], [place] [address]   The following is another list of primary care offices in the area who are accepting new patients at this time.  Please reach out to one of them directly and let them know you would like to schedule an appointment to follow up on an Emergency Department visit, and/or to establish a new primary care provider (PCP).  There are likely other primary care clinics in the are who are accepting new patients, but this is an excellent place to start:  [facility] Lead physician: Dr Shirlee Latch [address] [place], [place] [address] [phone]  [facility] Lead Physician: Dr Alba Cory [address], [place], [place] [address] [phone]  [facility]  Lead  Physician: Dr Olevia Perches [address] [place], [place] [address] [phone]  [facility] Lead Physician: Dr Sofie Hartigan [address] [place], [place], [place] [address] [phone]  [facility] Primary Care & Sports Medicine at [facility] Lead Physician: Dr Bari Edward [address] [place], [place] [address] [phone]   It was my pleasure to care for you today.   Daneil Dan Modesto Charon, MD

## 2022-10-29 NOTE — ED Notes (Signed)
Per Dr Modesto Charon, give IV metoprolol then depending on HR response may also give PO. Called lab to f/u if troponin add on can be run from previous specimen. They will check.

## 2022-10-29 NOTE — ED Triage Notes (Signed)
Pt to ED from home [facility] for a fib, hx of same. She woke up this am and felt palpitations, slight weakness and dizziness. Took PO metoprolol at 0815 and called EMS.  CBG 129. 159/62, 99% on RA.  bolus given by EMS. Pt had orthostatic drop in BP with standing from SBP 160 to 120.   Intermittent nausea. Alert, oriented. Skin dry. Saw cardiologist 3 days ago.   Pt takes eliquis 5mg  BID and metoprolol 25mg  once daily, also valsartan and verapamil.

## 2022-10-29 NOTE — ED Provider Notes (Signed)
[facility] Provider Note    Event Date/Time   First MD Initiated Contact with Patient 10/29/22 0920     (approximate)   History   Irregular Heart Beat   HPI  Kristi Barrera is a 82 y.o. female   Past medical history of paroxysmal atrial fibrillation on Eliquis and verapamil, mitral regurgitation, left bundle branch block, pretension, hyperlipidemia, hypothyroid, who presents to the emergency department with palpitations.  Sensation of fluttering in her chest this morning.  She was advised to take metoprolol 25 mg by mouth when she feels sensation of atrial fibrillation and she took it this morning about 1 hour prior to arrival to the emergency department.  EMS found her in atrial fibrillation with rapid ventricular response in the 120s and normotension.  She has been fully compliant with all of her home medications and denies any recent illnesses, alcohol use, changes in medications, chest pain or respiratory complaints.  She feels better after taking her metoprolol.  EMS did not give any further medications except for a small fluid bolus.  Independent Historian contributed to assessment above: EMS  External Medical Documents Reviewed: Cardiology appointment from April 2024 documenting past medical history, history of atrial fibrillation and current medications.      Physical Exam   Triage Vital Signs: ED Triage Vitals [10/29/22 0920]  Enc Vitals Group     BP      Pulse      Resp      Temp      Temp src      SpO2      Weight 149 lb (67.6 kg)     Height 5\' 2"  (1.575 m)     Head Circumference      Peak Flow      Pain Score 0     Pain Loc      Pain Edu?      Excl. in GC?     Most recent vital signs: Vitals:   10/29/22 1145 10/29/22 1230  BP: (!) 141/96 136/83  Pulse: (!) 121 (!) 128  Resp: 20 19  SpO2: 95% 90%    General: Awake, no distress.  CV:  Good peripheral perfusion.  Resp:  Normal effort.  Abd:  No distention.   Other:  Awake alert comfortable in no acute distress continues to have irregular rhythm with a heart rate in the 100-120 range and normotension.  Appears euvolemic.  Lungs clear.   ED Results / Procedures / Treatments   Labs (all labs ordered are listed, but only abnormal results are displayed) Labs Reviewed  BASIC METABOLIC PANEL - Abnormal; Notable for the following components:      Result Value   Sodium 134 (*)    Glucose, Bld 101 (*)    Creatinine, Ser 1.05 (*)    GFR, Estimated 54 (*)    All other components within normal limits  CBC - Abnormal; Notable for the following components:   RDW 11.4 (*)    All other components within normal limits  T4, FREE - Abnormal; Notable for the following components:   Free T4 1.28 (*)    All other components within normal limits  MAGNESIUM  TSH  TROPONIN I (HIGH SENSITIVITY)     I ordered and reviewed the above labs they are notable for normal electrolytes and negative initial troponin  EKG  ED ECG REPORT I, Pilar JarvisSilas Shelah Heatley, the attending physician, personally viewed and interpreted this ECG.   Date: 10/29/2022  EKG  Time: 0927  Rate: 104  Rhythm: AF  Axis: nl  Intervals:LBBB  ST&T Change: no stemi    RADIOLOGY I independently reviewed and interpreted chest x-ray and see no obvious focality or pneumothorax.   PROCEDURES:  Critical Care performed: Yes, see critical care procedure note(s)  .Critical Care  Performed by: Pilar Jarvis, MD Authorized by: Pilar Jarvis, MD   Critical care provider statement:    Critical care time (minutes):  30   Critical care was time spent personally by me on the following activities:  Development of treatment plan with patient or surrogate, discussions with consultants, evaluation of patient's response to treatment, examination of patient, ordering and review of laboratory studies, ordering and review of radiographic studies, ordering and performing treatments and interventions, pulse oximetry,  re-evaluation of patient's condition and review of old charts    MEDICATIONS ORDERED IN ED: Medications  ALPRAZolam (XANAX) tablet 0.25 mg (has no administration in time range)  isosorbide mononitrate (IMDUR) 24 hr tablet 60 mg (60 mg Oral Given 10/29/22 0956)  metoprolol tartrate (LOPRESSOR) injection 5 mg (5 mg Intravenous Given 10/29/22 0928)  apixaban (ELIQUIS) tablet 5 mg (5 mg Oral Given 10/29/22 0957)  levothyroxine (SYNTHROID) tablet 100 mcg (100 mcg Oral Given 10/29/22 0957)  metoprolol tartrate (LOPRESSOR) injection 5 mg (5 mg Intravenous Given 10/29/22 1032)  metoprolol tartrate (LOPRESSOR) tablet 25 mg (25 mg Oral Given 10/29/22 1108)  metoprolol tartrate (LOPRESSOR) injection 5 mg (5 mg Intravenous Given 10/29/22 1215)  sodium chloride 0.9 % bolus 500 mL (500 mLs Intravenous New Bag/Given 10/29/22 1300)     IMPRESSION / MDM / ASSESSMENT AND PLAN / ED COURSE  I reviewed the triage vital signs and the nursing notes.                                Patient's presentation is most consistent with acute presentation with potential threat to life or bodily function.  Differential diagnosis includes, but is not limited to, atrial fibrillation with RVR, electrolyte disturbances, thyroid dysfunction, considered but less likely ACS given no chest pain.   The patient is on the cardiac monitor to evaluate for evidence of arrhythmia and/or significant heart rate changes.  MDM: This is a patient with paroxysmal atrial fibrillation who took her as needed metoprolol as instructed by her doctor with some improvement in her symptoms but remains tachycardic in the low 100s occasionally up to 120 and normotension.  She has no chest pain to suggest ischemic cause.  She has no recent illnesses and has been fully compliant with all of her home medications.  I will give an IV dose of 5 mg metoprolol for better rate control.  Check electrolytes and thyroid.  I considered hospitalization for admission or  observation for with rate controlled and the patient's labs unremarkable asymptomatic at this time, I think that outpatient follow-up most appropriate at this time.  She was in total given 3 x 5 mg IV metoprolol as well as 25 mg p.o. metoprolol with heart rate continuously monitored now in the 80s to 90 with normotension.        FINAL CLINICAL IMPRESSION(S) / ED DIAGNOSES   Final diagnoses:  Atrial fibrillation with RVR     Rx / DC Orders   ED Discharge Orders     None        Note:  This document was prepared using Dragon voice recognition software and may include unintentional  dictation errors.    Pilar Jarvis, MD 10/29/22 1311

## 2022-10-29 NOTE — ED Notes (Signed)
Sent green top tube for add on trop.

## 2022-10-29 NOTE — ED Notes (Signed)
Dr Modesto Charon at bedside again. Daughter of pt at bedside.

## 2023-01-10 ENCOUNTER — Emergency Department
Admission: EM | Admit: 2023-01-10 | Discharge: 2023-01-10 | Disposition: A | Discharge status: Home / Self Care | Payer: Medicare Other | Attending: Emergency Medicine | Admitting: Emergency Medicine

## 2023-01-10 ENCOUNTER — Other Ambulatory Visit: Payer: Self-pay

## 2023-01-10 ENCOUNTER — Encounter: Payer: Self-pay | Admitting: Pharmacy Technician

## 2023-01-10 DIAGNOSIS — I4891 Unspecified atrial fibrillation: Secondary | ICD-10-CM | POA: Insufficient documentation

## 2023-01-10 DIAGNOSIS — Z7901 Long term (current) use of anticoagulants: Secondary | ICD-10-CM | POA: Insufficient documentation

## 2023-01-10 DIAGNOSIS — I10 Essential (primary) hypertension: Secondary | ICD-10-CM | POA: Diagnosis not present

## 2023-01-10 LAB — COMPREHENSIVE METABOLIC PANEL
ALT: 14 U/L (ref 0–44)
AST: 22 U/L (ref 15–41)
Albumin: 4.4 g/dL (ref 3.5–5.0)
Alkaline Phosphatase: 57 U/L (ref 38–126)
Anion gap: 8 (ref 5–15)
BUN: 18 mg/dL (ref 8–23)
CO2: 28 mmol/L (ref 22–32)
Calcium: 10 mg/dL (ref 8.9–10.3)
Chloride: 99 mmol/L (ref 98–111)
Creatinine, Ser: 1.03 mg/dL — ABNORMAL HIGH (ref 0.44–1.00)
GFR, Estimated: 55 mL/min — ABNORMAL LOW (ref >60)
Glucose, Bld: 115 mg/dL — ABNORMAL HIGH (ref 70–99)
Potassium: 4.3 mmol/L (ref 3.5–5.1)
Sodium: 135 mmol/L (ref 135–145)
Total Bilirubin: 0.7 mg/dL (ref 0.3–1.2)
Total Protein: 7.7 g/dL (ref 6.5–8.1)

## 2023-01-10 LAB — CBC
HCT: 41.3 % (ref 36.0–46.0)
Hemoglobin: 13.9 g/dL (ref 12.0–15.0)
MCH: 30.8 pg (ref 26.0–34.0)
MCHC: 33.7 g/dL (ref 30.0–36.0)
MCV: 91.4 fL (ref 80.0–100.0)
Platelets: 277 10*3/uL (ref 150–400)
RBC: 4.52 MIL/uL (ref 3.87–5.11)
RDW: 11.5 % (ref 11.5–15.5)
WBC: 6.2 10*3/uL (ref 4.0–10.5)
nRBC: 0 % (ref 0.0–0.2)

## 2023-01-10 MED ORDER — VERAPAMIL HCL ER 180 MG PO TBCR: 180.0000 mg | EXTENDED_RELEASE_TABLET | Freq: Every day | ORAL | 5 refills | Status: DC

## 2023-01-10 MED ORDER — METOPROLOL TARTRATE 5 MG/5ML IV SOLN
2.5000 mg | Freq: Once | INTRAVENOUS | Status: AC
  Administered 2023-01-10: 2.5 mg via INTRAVENOUS
  Filled 2023-01-10: qty 5

## 2023-01-10 MED ORDER — APIXABAN 5 MG PO TABS
5.0000 mg | ORAL_TABLET | Freq: Once | ORAL | Status: AC
  Administered 2023-01-10: 5 mg via ORAL
  Filled 2023-01-10: qty 1

## 2023-01-10 MED ORDER — SODIUM CHLORIDE 0.9 % IV BOLUS
500.0000 mL | Freq: Once | INTRAVENOUS | Status: AC
  Administered 2023-01-10: 500 mL via INTRAVENOUS

## 2023-01-10 NOTE — ED Triage Notes (Signed)
Pt here via ems from home with reports of afib. HR 70-140. Pt took PRN metoprolol this morning when she felt herself go into afib. Pt asymptomatic.

## 2023-01-10 NOTE — ED Notes (Signed)
Patient Alert and oriented to baseline. Stable and ambulatory to baseline. Patient verbalized understanding of the discharge instructions.  Patient belongings were taken by the patient.   

## 2023-01-10 NOTE — ED Provider Notes (Addendum)
[facility] Provider Note    Event Date/Time   First MD Initiated Contact with Patient 01/10/23 1001     (approximate)  History   Chief Complaint: Atrial Fibrillation  HPI  Kristi Barrera is a 82 y.o. female with a past medical history anxiety, gastric reflux, hypertension, paroxysmal atrial fibrillation on Eliquis who presents to the emergency department for atrial fibrillation.  According to the patient she woke this morning with a feeling of rapid/skipping heartbeats consistent with prior episodes of atrial fibrillation.  Patient states that has been approximate 1 months since she was last in A-fib.  Patient takes Eliquis twice daily every day and is due for her dose now she states.  She takes metoprolol but only if in A-fib so she did take a tablet this morning but continued to have symptoms so she came to the emergency department.  Patient denies any chest pain or shortness of breath but states a racing sensation at times.  Physical Exam   Triage Vital Signs: ED Triage Vitals  Enc Vitals Group     BP 01/10/23 1002 (!) 160/100     Pulse Rate 01/10/23 1002 (!) 107     Resp 01/10/23 1002 18     Temp 01/10/23 1005 97.8 F (36.6 C)     Temp Source 01/10/23 1005 Oral     SpO2 01/10/23 1002 98 %     Weight 01/10/23 1000 148 lb (67.1 kg)     Height 01/10/23 1000 5\' 1"  (1.549 m)     Head Circumference --      Peak Flow --      Pain Score 01/10/23 1000 0     Pain Loc --      Pain Edu? --      Excl. in GC? --     Most recent vital signs: Vitals:   01/10/23 1002 01/10/23 1005  BP: (!) 160/100   Pulse: (!) 107   Resp: 18   Temp:  97.8 F (36.6 C)  SpO2: 98%     General: Awake, no distress.  CV:  Good peripheral perfusion.  Irregular rhythm rate around 100 bpm. Resp:  Normal effort.  Equal breath sounds bilaterally.  Abd:  No distention.  Soft, nontender.  No rebound or guarding.  ED Results / Procedures / Treatments   EKG  EKG viewed  and interpreted by myself shows what appears to be atrial fibrillation at 99 bpm with a widened QRS, left axis deviation nonspecific ST changes most consistent with left bundle branch block.  Similar morphology to last EKG 10/29/2022.  MEDICATIONS ORDERED IN ED: Medications  apixaban (ELIQUIS) tablet 5 mg (has no administration in time range)  metoprolol tartrate (LOPRESSOR) injection 2.5 mg (2.5 mg Intravenous Given 01/10/23 1016)  sodium chloride 0.9 % bolus 500 mL (500 mLs Intravenous New Bag/Given 01/10/23 1015)     IMPRESSION / MDM / ASSESSMENT AND PLAN / ED COURSE  I reviewed the triage vital signs and the nursing notes.  Patient's presentation is most consistent with acute presentation with potential threat to life or bodily function.  Patient presents to the emergency department for acute onset of atrial fibrillation symptoms which include racing heart rate and skipping this morning consistent with past episodes of atrial fibrillation.  Overall patient appears well, no distress.  Appears to be in atrial fibrillation around 100 bpm.  Will dose 2.5 mg of IV metoprolol we will check labs IV hydrate and continue to closely monitor.  Patient is due for her normal Eliquis dose and did not bring it with her we will dose Eliquis per her request.  Will continue to closely monitor.  I spoke with Dr.Callwood, of cardiology, who would like the patient's verapamil increased from 120 mg daily to 180 mg daily.  She can still take metoprolol if needed for very fast heart rate but the 180s should be enough to control the patient's rate.  They will follow-up in the office.  Patient agreeable to plan.  The patient just prior to discharge is now converted back to a normal sinus rhythm at 65 bpm.  We will keep the patient on her verapamil 120 mg instead of the 180.  Patient will follow-up in the office with Dr. Juliann Pares.  FINAL CLINICAL IMPRESSION(S) / ED DIAGNOSES   Atrial fibrillation with rapid ventricular  response  Note:  This document was prepared using Dragon voice recognition software and may include unintentional dictation errors.   Minna Antis, MD 01/10/23 1217    Minna Antis, MD 01/10/23 1227

## 2023-01-10 NOTE — Discharge Instructions (Addendum)
Please follow-up with cardiology.  Please call the number provided to arrange a follow-up appointment this week for recheck/reevaluation.  Return to the emergency department for any chest pain, trouble breathing, or any other symptom concerning to yourself.  Please continue to take your typical 120 mg dose of verapamil until seen by cardiology.

## 2023-01-13 ENCOUNTER — Other Ambulatory Visit: Payer: Self-pay

## 2023-01-13 DIAGNOSIS — E7800 Pure hypercholesterolemia, unspecified: Secondary | ICD-10-CM

## 2023-01-13 DIAGNOSIS — I480 Paroxysmal atrial fibrillation: Secondary | ICD-10-CM

## 2023-01-14 ENCOUNTER — Ambulatory Visit
Admission: RE | Admit: 2023-01-14 | Discharge: 2023-01-14 | Disposition: A | Discharge status: Home / Self Care | Payer: Medicare Other | Source: Ambulatory Visit | Attending: Internal Medicine | Admitting: Internal Medicine

## 2023-01-14 DIAGNOSIS — I480 Paroxysmal atrial fibrillation: Secondary | ICD-10-CM | POA: Diagnosis present

## 2023-01-14 DIAGNOSIS — E7800 Pure hypercholesterolemia, unspecified: Secondary | ICD-10-CM | POA: Diagnosis not present

## 2023-02-18 NOTE — Progress Notes (Signed)
Cardiology Office Note  Date:  02/19/2023   ID:  Kristi Barrera, DOB 1941-02-25, MRN [id number]  PCP:  Marguarite Arbour, MD   Chief Complaint  Patient presents with   New Patient (Initial Visit)    Establish care for A-Fib; former patient of Dr. Callwood/Paraschos. Patient was given amiodarone by Dr. Juliann Pares but was afraid to take it. Patient c/o shortness of breath and occasional feels rapid irregular heart beats. Medications reviewed by the patient verbally.     HPI:  Kristi Barrera is a 82 year old woman with past medical history of Atrial fibrillation, paroxysmal Hypertension Hyperlipidemia Osa on CPAP Who presents by referral from Dr. Judithann Sheen for paroxysmal atrial fibrillation  Reports having several episodes of atrial fibrillation over the past several years Recently, admitted to ED on 01/10/23 due to atrial fibrillation, notes waking up with heart flutters.  Verapamil dosing increased 120 up to 180 daily Metoprolol as needed for tachycardia Converted back to normal sinus rhythm in the ER  an episode on April, 2023  Received meds in the ER, converted to NSR  Echocardiogram March 2022, outside study Normal left and right ventricular size and function  EF 50% severe MR Moderate TR  Repeat echocardiogram January 2023, images reviewed Normal LV function Mild MR  BP at home :135/75 before meds Afternoon: 145/75  Seen by cardiology Gavin Potters, is recommended that she start amiodarone 200 mg daily.  She read the side effects and was concerned about potential complications start the medication  Reports compliance with Eliquis 5 twice daily Compliant with CPAP for sleep apnea  EKG personally reviewed by myself on todays visit EKG Interpretation Date/Time:  Friday February 19 2023 15:36:17 EDT Ventricular Rate:  66 PR Interval:  192 QRS Duration:  148 QT Interval:  426 QTC Calculation: 446 R Axis:   -2  Text Interpretation: Normal sinus rhythm Left  bundle branch block When compared with ECG of 10-Jan-2023 12:24, PREVIOUS ECG IS PRESENT Confirmed by Julien Nordmann [phone]) on 02/19/2023 3:52:39 PM    PMH:   has a past medical history of Abnormal cardiac valve, Anxiety, Arthritis, Chest pain, Chronic low back pain, Colitis, chronic, ulcerative (HCC), Complication of anesthesia, Dysrhythmia, Family history of adverse reaction to anesthesia, GERD (gastroesophageal reflux disease), HTN (hypertension), Hyperlipidemia, Hypothyroidism, LBBB (left bundle branch block), Low blood potassium, PAF (paroxysmal atrial fibrillation) (HCC), Palpitations, Recurrent UTI, Sarcoidosis, Sinusitis, Skin cancer, Sleep apnea, Thyroid disease, Vitamin D deficiency, and Wears dentures.  PSH:    Past Surgical History:  Procedure Laterality Date   BREAST CYST ASPIRATION Left    neg   BREAST CYST ASPIRATION Right 09/25/2019   CATARACT EXTRACTION W/PHACO Right 04/06/2017   Procedure: CATARACT EXTRACTION PHACO AND INTRAOCULAR LENS PLACEMENT (IOC);  Surgeon: Galen Manila, MD;  Location: [facility] ORS;  Service: Ophthalmology;  Laterality: Right;  [place] 00:36.1 AP% 18.5 CDE 6.66 fluid Pack lot # [id number]   CATARACT EXTRACTION W/PHACO Left 04/13/2022   Procedure: CATARACT EXTRACTION PHACO AND INTRAOCULAR LENS PLACEMENT (IOC) LEFT 8.82 00:40.8;  Surgeon: Nevada Crane, MD;  Location: [facility];  Service: Ophthalmology;  Laterality: Left;  sleep apnea   COLONOSCOPY WITH PROPOFOL N/A 08/06/2015   Procedure: COLONOSCOPY WITH PROPOFOL;  Surgeon: Scot Jun, MD;  Location: [facility] ENDOSCOPY;  Service: Endoscopy;  Laterality: N/A;   COLONOSCOPY WITH PROPOFOL N/A 01/15/2016   Procedure: COLONOSCOPY WITH PROPOFOL;  Surgeon: Scot Jun, MD;  Location: [facility] ENDOSCOPY;  Service: Endoscopy;  Laterality: N/A;   Urinary procedure  Current Outpatient Medications  Medication Sig Dispense Refill   ALPRAZolam (XANAX) 0.25 MG tablet Take 0.125-0.25 mg by mouth See  admin instructions. Take 0.125mg  in the morning and 0.25mg  at bedtime.  May take an additional 0.25mg  as needed for anxiety.     calcium carbonate (TUMS - DOSED IN MG ELEMENTAL CALCIUM) 500 MG chewable tablet Chew 1-2 tablets by mouth 3 (three) times daily as needed for indigestion or heartburn.     dicyclomine (BENTYL) 20 MG tablet Take 10 mg by mouth 2 (two) times daily.     ELIQUIS 5 MG TABS tablet Take 1 tablet by mouth 2 (two) times daily.     isosorbide mononitrate (IMDUR) 60 MG 24 hr tablet Take 60 mg by mouth 2 (two) times daily.     metoprolol tartrate (LOPRESSOR) 25 MG tablet Take 25 mg by mouth daily as needed.     OXYGEN Inhale 2 L into the lungs at bedtime.     spironolactone (ALDACTONE) 25 MG tablet Take 0.5 tablets (12.5 mg total) by mouth daily. 15 tablet 0   SYNTHROID 100 MCG tablet Take 100 mcg by mouth daily before breakfast.     valsartan (DIOVAN) 160 MG tablet Take 160 mg by mouth 2 (two) times daily.     verapamil (CALAN-SR) 120 MG CR tablet Take 1 tablet by mouth daily.     Vitamin D, Ergocalciferol, (DRISDOL) 1.25 MG (50000 UNIT) CAPS capsule Take 50,000 Units by mouth 2 (two) times a week.     No current facility-administered medications for this visit.    Allergies:   Iohexol, Sulfa antibiotics, Clonidine derivatives, Floxin [ofloxacin], Hydralazine hcl, Lotrel [amlodipine besy-benazepril hcl], Potassium-containing compounds, Protonix [pantoprazole sodium], and Toprol xl [metoprolol succinate]   Social History:  The patient  reports that she has never smoked. She has never used smokeless tobacco. She reports that she does not drink alcohol and does not use drugs.   Family History:   family history includes Heart disease in her father; Hypertension in her father; Pneumonia in her father; Rheum arthritis in her mother; Stroke in her father and mother.    Review of Systems: Review of Systems  Constitutional: Negative.   HENT: Negative.    Respiratory: Negative.     Cardiovascular: Negative.   Gastrointestinal: Negative.   Musculoskeletal: Negative.   Neurological: Negative.   Psychiatric/Behavioral: Negative.    All other systems reviewed and are negative.   PHYSICAL EXAM: VS:  BP (!) 150/60 (BP Location: Right Arm, Patient Position: Sitting, Cuff Size: Normal)   Pulse 66   Ht 5' 1.5" (1.562 m)   Wt 149 lb 4 oz (67.7 kg)   SpO2 96%   BMI 27.74 kg/m  , BMI Body mass index is 27.74 kg/m. GEN: Well nourished, well developed, in no acute distress HEENT: normal Neck: no JVD, carotid bruits, or masses Cardiac: RRR; no murmurs, rubs, or gallops,no edema  Respiratory:  clear to auscultation bilaterally, normal work of breathing GI: soft, nontender, nondistended, + BS MS: no deformity or atrophy Skin: warm and dry, no rash Neuro:  Strength and sensation are intact Psych: euthymic mood, full affect  Recent Labs: 10/29/2022: Magnesium 1.7; TSH 1.462 01/10/2023: ALT 14; BUN 18; Creatinine, Ser 1.03; Hemoglobin 13.9; Platelets 277; Potassium 4.3; Sodium 135    Lipid Panel No results found for: "CHOL", "HDL", "LDLCALC", "TRIG"    Wt Readings from Last 3 Encounters:  02/19/23 149 lb 4 oz (67.7 kg)  01/10/23 148 lb (67.1 kg)  10/29/22  149 lb (67.6 kg)     ASSESSMENT AND PLAN:  Problem List Items Addressed This Visit       Cardiology Problems   Atrial fibrillation with rapid ventricular response (HCC)   Relevant Medications   metoprolol tartrate (LOPRESSOR) 25 MG tablet   verapamil (CALAN-SR) 120 MG CR tablet   Other Relevant Orders   EKG 12-Lead (Completed)   HTN (hypertension) - Primary   Relevant Medications   metoprolol tartrate (LOPRESSOR) 25 MG tablet   verapamil (CALAN-SR) 120 MG CR tablet   Other Relevant Orders   EKG 12-Lead (Completed)   Paroxysmal atrial fibrillation Evaluated in the ER on several occasions April and June 2024 with conversion to normal sinus rhythm after additional doses of rate controlling  medication On Eliquis 5 twice daily She would prefer not to be on amiodarone daily as offered by cardiology Kristi Barrera We have recommended she start metoprolol succinate 12.5 daily with titration up to 25 mg daily as tolerated Continue current dose of verapamil ER 120 daily For breakthrough arrhythmia, could consider flecainide 50 twice daily up to 100 twice daily.  Essential hypertension Continue current medications, Add metoprolol succinate as above Reports better control in the morning, with slight increase in the afternoons 140 systolic  Coronary calcification Score 113, prefers not to be on a statin Currently with no symptoms of angina. No further workup at this time.  Hyperlipidemia Prefers not to be on a statin Could consider Zetia if desired    Total encounter time more than 60 minutes  Greater than 50% was spent in counseling and coordination of care with the patient    Signed, Dossie Arbour, M.D., Ph.D. [facility] [place], [place] [phone]

## 2023-02-19 ENCOUNTER — Ambulatory Visit: Payer: Medicare Other | Attending: Cardiovascular Disease | Admitting: Cardiovascular Disease

## 2023-02-19 ENCOUNTER — Encounter: Payer: Self-pay | Admitting: Cardiovascular Disease

## 2023-02-19 VITALS — BP 150/60 | HR 66 | Ht 61.5 in | Wt 149.2 lb

## 2023-02-19 DIAGNOSIS — I10 Essential (primary) hypertension: Secondary | ICD-10-CM

## 2023-02-19 DIAGNOSIS — E782 Mixed hyperlipidemia: Secondary | ICD-10-CM | POA: Diagnosis not present

## 2023-02-19 DIAGNOSIS — I2584 Coronary atherosclerosis due to calcified coronary lesion: Secondary | ICD-10-CM

## 2023-02-19 DIAGNOSIS — I2510 Atherosclerotic heart disease of native coronary artery without angina pectoris: Secondary | ICD-10-CM

## 2023-02-19 DIAGNOSIS — I4891 Unspecified atrial fibrillation: Secondary | ICD-10-CM | POA: Diagnosis not present

## 2023-02-19 MED ORDER — METOPROLOL SUCCINATE ER 25 MG PO TB24: 25.0000 mg | ORAL_TABLET | Freq: Every day | ORAL | 3 refills | Status: DC

## 2023-02-19 NOTE — Patient Instructions (Addendum)
Medication Instructions:  Please start metoprolol succinate 25 mg daily  If you need a refill on your cardiac medications before your next appointment, please call your pharmacy.   Lab work: No new labs needed  Testing/Procedures: No new testing needed  Follow-Up: At [facility], you and your health needs are our priority.  As part of our continuing mission to provide you with exceptional heart care, we have created designated Provider Care Teams.  These Care Teams include your primary Cardiologist (physician) and Advanced Practice Providers (APPs -  Physician Assistants and Nurse Practitioners) who all work together to provide you with the care you need, when you need it.  You will need a follow up appointment in 6 months  Providers on your designated Care Team:   Nicolasa Ducking, NP Eula Listen, PA-C Cadence Fransico Michael, [place]  COVID-19 Vaccine Information can be found at: [email or web address] For questions related to vaccine distribution or appointments, please email [email or web address] or call 8041418840.

## 2023-03-10 ENCOUNTER — Telehealth: Payer: Self-pay | Admitting: Cardiovascular Disease

## 2023-03-10 NOTE — Telephone Encounter (Signed)
Patient wanted clarificatioin on when to take medications. Patient did not want to take her metoprolol succinate (TOPROL XL) 25 MG 24 hr tablet at night because she takes verapamil (CALAN-SR) 120 MG CR tablet at night and she wakes with a heart rate in the 40's. Informed patient that one is for blood pressure control and the other is for heart rate. She can take them together at night if she is comfortable. She can take the metoprolol succinate (TOPROL XL) 25 MG 24 hr tablet in the morning but to be aware that it could increase dizziness and lightheadedness. Patient understood with read back

## 2023-03-10 NOTE — Telephone Encounter (Signed)
Pt c/o medication issue:  1. Name of Medication: metoprolol succinate (TOPROL XL) 25 MG 24 hr tablet   2. How are you currently taking this medication (dosage and times per day)? Take 1 tablet (25 mg total) by mouth daily   3. Are you having a reaction (difficulty breathing--STAT)? No  4. What is your medication issue? Patient would like to know if she should take the whole tablet or a half tablet. Patient states when she wakes up in the morning her heart rate will be down in the 40's, but as she gets up and is moving around her heart rate will go up into the 60's.

## 2023-03-23 ENCOUNTER — Telehealth: Payer: Self-pay | Admitting: Cardiovascular Disease

## 2023-03-23 MED ORDER — VERAPAMIL HCL ER 120 MG PO TBCR: 120.0000 mg | EXTENDED_RELEASE_TABLET | Freq: Every day | ORAL | 0 refills | Status: DC

## 2023-03-23 NOTE — Telephone Encounter (Signed)
Requested Prescriptions   Signed Prescriptions Disp Refills   verapamil (CALAN-SR) 120 MG CR tablet 90 tablet 0    Sig: Take 1 tablet (120 mg total) by mouth daily.    Authorizing Provider: Antonieta Iba    Ordering User: Guerry Minors

## 2023-03-23 NOTE — Telephone Encounter (Signed)
*  STAT* If patient is at the pharmacy, call can be transferred to refill team.   1. Which medications need to be refilled? (please list name of each medication and dose if known)   verapamil (CALAN-SR) 120 MG CR tablet     2. Would you like to learn more about the convenience, safety, & potential cost savings by using the [facility]? No   3. Are you open to using the [facility] (Type [facility].) No   4. Which pharmacy/location (including street and city if local pharmacy) is medication to be sent to?[facility] [address] [place] - [address]    5. Do they need a 30 day or 90 day supply? 90 day

## 2023-03-26 ENCOUNTER — Ambulatory Visit: Payer: Medicare Other | Admitting: Cardiovascular Disease

## 2023-04-09 ENCOUNTER — Other Ambulatory Visit: Payer: Self-pay | Admitting: Internal Medicine

## 2023-04-09 DIAGNOSIS — Z1231 Encounter for screening mammogram for malignant neoplasm of breast: Secondary | ICD-10-CM

## 2023-04-22 ENCOUNTER — Inpatient Hospital Stay
Admission: EM | Admit: 2023-04-22 | Discharge: 2023-04-24 | DRG: 291 | Disposition: A | Discharge status: Home / Self Care | Payer: Medicare Other | Attending: Internal Medicine | Admitting: Internal Medicine

## 2023-04-22 ENCOUNTER — Emergency Department: Payer: Medicare Other

## 2023-04-22 ENCOUNTER — Other Ambulatory Visit: Payer: Self-pay

## 2023-04-22 DIAGNOSIS — G4733 Obstructive sleep apnea (adult) (pediatric): Secondary | ICD-10-CM | POA: Diagnosis present

## 2023-04-22 DIAGNOSIS — I5031 Acute diastolic (congestive) heart failure: Secondary | ICD-10-CM | POA: Diagnosis present

## 2023-04-22 DIAGNOSIS — Z8249 Family history of ischemic heart disease and other diseases of the circulatory system: Secondary | ICD-10-CM

## 2023-04-22 DIAGNOSIS — E878 Other disorders of electrolyte and fluid balance, not elsewhere classified: Secondary | ICD-10-CM | POA: Diagnosis present

## 2023-04-22 DIAGNOSIS — K219 Gastro-esophageal reflux disease without esophagitis: Secondary | ICD-10-CM | POA: Diagnosis present

## 2023-04-22 DIAGNOSIS — Z7901 Long term (current) use of anticoagulants: Secondary | ICD-10-CM | POA: Diagnosis not present

## 2023-04-22 DIAGNOSIS — I110 Hypertensive heart disease with heart failure: Secondary | ICD-10-CM | POA: Diagnosis present

## 2023-04-22 DIAGNOSIS — I10 Essential (primary) hypertension: Secondary | ICD-10-CM | POA: Diagnosis not present

## 2023-04-22 DIAGNOSIS — E871 Hypo-osmolality and hyponatremia: Secondary | ICD-10-CM | POA: Diagnosis present

## 2023-04-22 DIAGNOSIS — Z79890 Hormone replacement therapy (postmenopausal): Secondary | ICD-10-CM

## 2023-04-22 DIAGNOSIS — E785 Hyperlipidemia, unspecified: Secondary | ICD-10-CM | POA: Diagnosis present

## 2023-04-22 DIAGNOSIS — U071 COVID-19: Secondary | ICD-10-CM | POA: Diagnosis present

## 2023-04-22 DIAGNOSIS — F419 Anxiety disorder, unspecified: Secondary | ICD-10-CM | POA: Diagnosis present

## 2023-04-22 DIAGNOSIS — I480 Paroxysmal atrial fibrillation: Secondary | ICD-10-CM | POA: Diagnosis present

## 2023-04-22 DIAGNOSIS — J9601 Acute respiratory failure with hypoxia: Secondary | ICD-10-CM

## 2023-04-22 DIAGNOSIS — Z91041 Radiographic dye allergy status: Secondary | ICD-10-CM

## 2023-04-22 DIAGNOSIS — Z85828 Personal history of other malignant neoplasm of skin: Secondary | ICD-10-CM

## 2023-04-22 DIAGNOSIS — I5033 Acute on chronic diastolic (congestive) heart failure: Secondary | ICD-10-CM

## 2023-04-22 DIAGNOSIS — Z8261 Family history of arthritis: Secondary | ICD-10-CM | POA: Diagnosis not present

## 2023-04-22 DIAGNOSIS — Z888 Allergy status to other drugs, medicaments and biological substances status: Secondary | ICD-10-CM

## 2023-04-22 DIAGNOSIS — R001 Bradycardia, unspecified: Secondary | ICD-10-CM | POA: Diagnosis present

## 2023-04-22 DIAGNOSIS — E039 Hypothyroidism, unspecified: Secondary | ICD-10-CM | POA: Diagnosis present

## 2023-04-22 DIAGNOSIS — E861 Hypovolemia: Secondary | ICD-10-CM | POA: Diagnosis present

## 2023-04-22 DIAGNOSIS — Z882 Allergy status to sulfonamides status: Secondary | ICD-10-CM

## 2023-04-22 DIAGNOSIS — Z87440 Personal history of urinary (tract) infections: Secondary | ICD-10-CM

## 2023-04-22 DIAGNOSIS — I447 Left bundle-branch block, unspecified: Secondary | ICD-10-CM | POA: Diagnosis present

## 2023-04-22 DIAGNOSIS — Z79899 Other long term (current) drug therapy: Secondary | ICD-10-CM

## 2023-04-22 DIAGNOSIS — E559 Vitamin D deficiency, unspecified: Secondary | ICD-10-CM | POA: Diagnosis present

## 2023-04-22 DIAGNOSIS — R5383 Other fatigue: Secondary | ICD-10-CM | POA: Diagnosis present

## 2023-04-22 DIAGNOSIS — Z823 Family history of stroke: Secondary | ICD-10-CM | POA: Diagnosis not present

## 2023-04-22 DIAGNOSIS — I272 Pulmonary hypertension, unspecified: Secondary | ICD-10-CM | POA: Diagnosis present

## 2023-04-22 DIAGNOSIS — I509 Heart failure, unspecified: Secondary | ICD-10-CM | POA: Insufficient documentation

## 2023-04-22 LAB — CBC
HCT: 36.6 % (ref 36.0–46.0)
Hemoglobin: 12.9 g/dL (ref 12.0–15.0)
MCH: 30.4 pg (ref 26.0–34.0)
MCHC: 35.2 g/dL (ref 30.0–36.0)
MCV: 86.1 fL (ref 80.0–100.0)
Platelets: 210 10*3/uL (ref 150–400)
RBC: 4.25 MIL/uL (ref 3.87–5.11)
RDW: 10.9 % — ABNORMAL LOW (ref 11.5–15.5)
WBC: 4.2 10*3/uL (ref 4.0–10.5)
nRBC: 0 % (ref 0.0–0.2)

## 2023-04-22 LAB — COMPREHENSIVE METABOLIC PANEL
ALT: 16 U/L (ref 0–44)
AST: 21 U/L (ref 15–41)
Albumin: 4 g/dL (ref 3.5–5.0)
Alkaline Phosphatase: 46 U/L (ref 38–126)
Anion gap: 11 (ref 5–15)
BUN: 15 mg/dL (ref 8–23)
CO2: 24 mmol/L (ref 22–32)
Calcium: 9 mg/dL (ref 8.9–10.3)
Chloride: 87 mmol/L — ABNORMAL LOW (ref 98–111)
Creatinine, Ser: 1.03 mg/dL — ABNORMAL HIGH (ref 0.44–1.00)
GFR, Estimated: 55 mL/min — ABNORMAL LOW (ref >60)
Glucose, Bld: 95 mg/dL (ref 70–99)
Potassium: 4.3 mmol/L (ref 3.5–5.1)
Sodium: 122 mmol/L — ABNORMAL LOW (ref 135–145)
Total Bilirubin: 0.9 mg/dL (ref 0.3–1.2)
Total Protein: 7.4 g/dL (ref 6.5–8.1)

## 2023-04-22 LAB — RESP PANEL BY RT-PCR (RSV, FLU A&B, COVID)  RVPGX2
Influenza A by PCR: NEGATIVE
Influenza B by PCR: NEGATIVE
Resp Syncytial Virus by PCR: NEGATIVE
SARS Coronavirus 2 by RT PCR: POSITIVE — AB

## 2023-04-22 LAB — TROPONIN I (HIGH SENSITIVITY): Troponin I (High Sensitivity): 6 ng/L (ref <18)

## 2023-04-22 MED ORDER — TRAZODONE HCL 50 MG PO TABS: 25.0000 mg | ORAL_TABLET | Freq: Every evening | ORAL | Status: DC | PRN

## 2023-04-22 MED ORDER — FUROSEMIDE 10 MG/ML IJ SOLN
40.0000 mg | Freq: Two times a day (BID) | INTRAMUSCULAR | Status: DC
  Administered 2023-04-23: 40 mg via INTRAVENOUS
  Filled 2023-04-22: qty 4

## 2023-04-22 MED ORDER — ISOSORBIDE MONONITRATE ER 60 MG PO TB24
60.0000 mg | ORAL_TABLET | Freq: Two times a day (BID) | ORAL | Status: DC
  Administered 2023-04-22 – 2023-04-24 (×4): 60 mg via ORAL
  Filled 2023-04-22 (×4): qty 1

## 2023-04-22 MED ORDER — DEXAMETHASONE SODIUM PHOSPHATE 10 MG/ML IJ SOLN
10.0000 mg | Freq: Once | INTRAMUSCULAR | Status: AC
  Administered 2023-04-22: 10 mg via INTRAVENOUS
  Filled 2023-04-22: qty 1

## 2023-04-22 MED ORDER — VITAMIN C 500 MG PO TABS
1000.0000 mg | ORAL_TABLET | Freq: Every day | ORAL | Status: DC
  Administered 2023-04-23 – 2023-04-24 (×2): 1000 mg via ORAL
  Filled 2023-04-22 (×2): qty 2

## 2023-04-22 MED ORDER — VITAMIN D (ERGOCALCIFEROL) 1.25 MG (50000 UNIT) PO CAPS: 50000.0000 [IU] | ORAL_CAPSULE | ORAL | Status: DC

## 2023-04-22 MED ORDER — GUAIFENESIN ER 600 MG PO TB12
600.0000 mg | ORAL_TABLET | Freq: Two times a day (BID) | ORAL | Status: DC
  Administered 2023-04-23 – 2023-04-24 (×4): 600 mg via ORAL
  Filled 2023-04-22 (×4): qty 1

## 2023-04-22 MED ORDER — ACETAMINOPHEN 650 MG RE SUPP: 650.0000 mg | Freq: Four times a day (QID) | RECTAL | Status: DC | PRN

## 2023-04-22 MED ORDER — METHYLPREDNISOLONE SODIUM SUCC 40 MG IJ SOLR
40.0000 mg | Freq: Every day | INTRAMUSCULAR | Status: DC
  Administered 2023-04-23: 40 mg via INTRAVENOUS
  Filled 2023-04-22: qty 1

## 2023-04-22 MED ORDER — ACETAMINOPHEN 325 MG PO TABS
650.0000 mg | ORAL_TABLET | Freq: Four times a day (QID) | ORAL | Status: DC | PRN
  Filled 2023-04-22: qty 2

## 2023-04-22 MED ORDER — ONDANSETRON HCL 4 MG PO TABS: 4.0000 mg | ORAL_TABLET | Freq: Four times a day (QID) | ORAL | Status: DC | PRN

## 2023-04-22 MED ORDER — DICYCLOMINE HCL 20 MG PO TABS
10.0000 mg | ORAL_TABLET | Freq: Two times a day (BID) | ORAL | Status: DC
  Administered 2023-04-22 – 2023-04-24 (×4): 10 mg via ORAL
  Filled 2023-04-22 (×4): qty 1

## 2023-04-22 MED ORDER — SPIRONOLACTONE 12.5 MG HALF TABLET
12.5000 mg | ORAL_TABLET | Freq: Every day | ORAL | Status: DC
  Administered 2023-04-23 – 2023-04-24 (×2): 12.5 mg via ORAL
  Filled 2023-04-22 (×2): qty 1

## 2023-04-22 MED ORDER — VERAPAMIL HCL ER 120 MG PO TBCR: 120.0000 mg | EXTENDED_RELEASE_TABLET | Freq: Every day | ORAL | Status: DC

## 2023-04-22 MED ORDER — ALBUTEROL SULFATE (2.5 MG/3ML) 0.083% IN NEBU
2.5000 mg | INHALATION_SOLUTION | Freq: Once | RESPIRATORY_TRACT | Status: AC
  Administered 2023-04-22: 2.5 mg via RESPIRATORY_TRACT
  Filled 2023-04-22: qty 3

## 2023-04-22 MED ORDER — FUROSEMIDE 10 MG/ML IJ SOLN
40.0000 mg | Freq: Once | INTRAMUSCULAR | Status: AC
  Administered 2023-04-22: 40 mg via INTRAVENOUS
  Filled 2023-04-22: qty 4

## 2023-04-22 MED ORDER — MAGNESIUM HYDROXIDE 400 MG/5ML PO SUSP: 30.0000 mL | Freq: Every day | ORAL | Status: DC | PRN

## 2023-04-22 MED ORDER — HYDROCOD POLI-CHLORPHE POLI ER 10-8 MG/5ML PO SUER: 5.0000 mL | Freq: Two times a day (BID) | ORAL | Status: DC | PRN

## 2023-04-22 MED ORDER — APIXABAN 5 MG PO TABS
5.0000 mg | ORAL_TABLET | Freq: Two times a day (BID) | ORAL | Status: DC
  Administered 2023-04-23 – 2023-04-24 (×3): 5 mg via ORAL
  Filled 2023-04-22 (×3): qty 1

## 2023-04-22 MED ORDER — ONDANSETRON HCL 4 MG/2ML IJ SOLN: 4.0000 mg | Freq: Four times a day (QID) | INTRAMUSCULAR | Status: DC | PRN

## 2023-04-22 MED ORDER — METOPROLOL SUCCINATE ER 25 MG PO TB24
25.0000 mg | ORAL_TABLET | Freq: Every day | ORAL | Status: DC
  Administered 2023-04-23 – 2023-04-24 (×2): 25 mg via ORAL
  Filled 2023-04-22 (×2): qty 1

## 2023-04-22 MED ORDER — ZINC SULFATE 220 (50 ZN) MG PO CAPS
220.0000 mg | ORAL_CAPSULE | Freq: Every day | ORAL | Status: DC
  Administered 2023-04-23 – 2023-04-24 (×2): 220 mg via ORAL
  Filled 2023-04-22 (×2): qty 1

## 2023-04-22 MED ORDER — SODIUM CHLORIDE 0.9 % IV BOLUS
500.0000 mL | Freq: Once | INTRAVENOUS | Status: AC
  Administered 2023-04-22: 500 mL via INTRAVENOUS

## 2023-04-22 MED ORDER — CALCIUM CARBONATE ANTACID 500 MG PO CHEW
1.0000 | CHEWABLE_TABLET | Freq: Three times a day (TID) | ORAL | Status: DC | PRN
  Administered 2023-04-23: 200 mg via ORAL
  Filled 2023-04-22: qty 1

## 2023-04-22 MED ORDER — ALPRAZOLAM 0.5 MG PO TABS
0.5000 mg | ORAL_TABLET | Freq: Every day | ORAL | Status: DC
  Administered 2023-04-23 (×2): 0.5 mg via ORAL
  Filled 2023-04-22 (×2): qty 1

## 2023-04-22 MED ORDER — LEVOTHYROXINE SODIUM 100 MCG PO TABS
100.0000 ug | ORAL_TABLET | Freq: Every day | ORAL | Status: DC
  Administered 2023-04-23 – 2023-04-24 (×2): 100 ug via ORAL
  Filled 2023-04-22 (×2): qty 1

## 2023-04-22 MED ORDER — ENOXAPARIN SODIUM 40 MG/0.4ML IJ SOSY: 40.0000 mg | PREFILLED_SYRINGE | INTRAMUSCULAR | Status: DC

## 2023-04-22 MED ORDER — IRBESARTAN 150 MG PO TABS
150.0000 mg | ORAL_TABLET | Freq: Every day | ORAL | Status: DC
  Administered 2023-04-22 – 2023-04-24 (×3): 150 mg via ORAL
  Filled 2023-04-22 (×3): qty 1

## 2023-04-22 NOTE — Assessment & Plan Note (Signed)
-  The patient will be admitted to a progressive unit bed. - We will continue diuresis with IV Lasix. - We Will follow serial troponins. - We will follow I's and O's and daily weights. - Cardiology consult be obtained. - I notified [facility] about the patient.

## 2023-04-22 NOTE — Assessment & Plan Note (Signed)
- 

## 2023-04-22 NOTE — ED Provider Notes (Signed)
[facility] Provider Note    Event Date/Time   First MD Initiated Contact with Patient 04/22/23 1858     (approximate)   History   Generalized Body Aches   HPI  Kristi Barrera is a 82 y.o. female with family member sick with COVID diagnosed on Saturday currently on molnupiravir presents to the ER for evaluation of generalized malaise fatigue weakness poor p.o. intake.  Today was the worst day of her symptoms.  Just has not had much appetite for anything.     Physical Exam   Triage Vital Signs: ED Triage Vitals [04/22/23 1623]  Encounter Vitals Group     BP (!) 186/93     Systolic BP Percentile      Diastolic BP Percentile      Pulse Rate (!) 58     Resp 19     Temp (!) 97.5 F (36.4 C)     Temp Source Axillary     SpO2 100 %     Weight 151 lb (68.5 kg)     Height 5\' 2"  (1.575 m)     Head Circumference      Peak Flow      Pain Score 7     Pain Loc      Pain Education      Exclude from Growth Chart     Most recent vital signs: Vitals:   04/22/23 2019 04/22/23 2105  BP:    Pulse: 66   Resp: (!) 25   Temp:  (!) 97.4 F (36.3 C)  SpO2: 97%      Constitutional: Alert  Eyes: Conjunctivae are normal.  Head: Atraumatic. Nose: No congestion/rhinnorhea. Mouth/Throat: Mucous membranes are moist.   Neck: Painless ROM.  Cardiovascular:   Good peripheral circulation. Respiratory: Normal respiratory effort.  No retractions.  Gastrointestinal: Soft and nontender.  Musculoskeletal:  no deformity Neurologic:  MAE spontaneously. No gross focal neurologic deficits are appreciated.  Skin:  Skin is warm, dry and intact. No rash noted. Psychiatric: Mood and affect are normal. Speech and behavior are normal.    ED Results / Procedures / Treatments   Labs (all labs ordered are listed, but only abnormal results are displayed) Labs Reviewed  RESP PANEL BY RT-PCR (RSV, FLU A&B, COVID)  RVPGX2 - Abnormal; Notable for the following  components:      Result Value   SARS Coronavirus 2 by RT PCR POSITIVE (*)    All other components within normal limits  CBC - Abnormal; Notable for the following components:   RDW 10.9 (*)    All other components within normal limits  COMPREHENSIVE METABOLIC PANEL - Abnormal; Notable for the following components:   Sodium 122 (*)    Chloride 87 (*)    Creatinine, Ser 1.03 (*)    GFR, Estimated 55 (*)    All other components within normal limits  TROPONIN I (HIGH SENSITIVITY)     EKG  ED ECG REPORT I, Willy Eddy, the attending physician, personally viewed and interpreted this ECG.   Date: 04/22/2023  EKG Time: 20:55 Rate: 70  Rhythm: sinus  Axis: normal  Intervals:lbbb  ST&T Change: no stemi    RADIOLOGY Please see ED Course for my review and interpretation.  I personally reviewed all radiographic images ordered to evaluate for the above acute complaints and reviewed radiology reports and findings.  These findings were personally discussed with the patient.  Please see medical record for radiology report.    PROCEDURES:  Critical Care performed: Yes, see critical care procedure note(s)  .Critical Care  Performed by: Willy Eddy, MD Authorized by: Willy Eddy, MD   Critical care provider statement:    Critical care time (minutes):  35   Critical care was necessary to treat or prevent imminent or life-threatening deterioration of the following conditions:  Respiratory failure   Critical care was time spent personally by me on the following activities:  Ordering and performing treatments and interventions, ordering and review of laboratory studies, ordering and review of radiographic studies, pulse oximetry, re-evaluation of patient's condition, review of old charts, obtaining history from patient or surrogate, examination of patient, evaluation of patient's response to treatment, discussions with primary provider, discussions with consultants and  development of treatment plan with patient or surrogate    MEDICATIONS ORDERED IN ED: Medications  sodium chloride 0.9 % bolus 500 mL (0 mLs Intravenous Stopped 04/22/23 1949)  sodium chloride 0.9 % bolus 500 mL (0 mLs Intravenous Stopped 04/22/23 2047)  dexamethasone (DECADRON) injection 10 mg (10 mg Intravenous Given 04/22/23 2057)  albuterol (PROVENTIL) (2.5 MG/3ML) 0.083% nebulizer solution 2.5 mg (2.5 mg Nebulization Given 04/22/23 2057)  furosemide (LASIX) injection 40 mg (40 mg Intravenous Given 04/22/23 2057)     IMPRESSION / MDM / ASSESSMENT AND PLAN / ED COURSE  I reviewed the triage vital signs and the nursing notes.                              Differential diagnosis includes, but is not limited to, Asthma, copd, CHF, pna, ptx, malignancy, Pe, anemia  Patient presenting to the ER for evaluation of symptoms as described above.  Based on symptoms, risk factors and considered above differential, this presenting complaint could reflect a potentially life-threatening illness therefore the patient will be placed on continuous pulse oximetry and telemetry for monitoring.  Laboratory evaluation will be sent to evaluate for the above complaints.      Clinical Course as of 04/22/23 2111  Thu Apr 22, 2023  9629 Patient is COVID-positive she is hyponatremic.  We discussed this finding will give IV fluids did recommend hospitalization for IV hydration but patient wanting to see how she would feel after IV fluids and reassessment if she is able to tolerate p.o. would prefer outpatient follow-up. [PR]  2011 Patient now becoming increasingly short of breath and hypoxic.  Fluids paused stat x-ray ordered.  She placed on supplemental oxygen. [PR]  2041 Chest x-ray on my review and interpretation does show findings concerning for edema and superimposed COVID 19.  She seemed to have tolerated the first half liter well but then became acutely worse after the second.  She again denies any history of  CHF but does appear more volume overloaded at this time.  Will give gentle Lasix will give IV Decadron.  Based on new hypoxia will consult hospitalist for admission. [PR]    Clinical Course User Index [PR] Willy Eddy, MD     FINAL CLINICAL IMPRESSION(S) / ED DIAGNOSES   Final diagnoses:  COVID-19  Acute respiratory failure with hypoxia (HCC)     Rx / DC Orders   ED Discharge Orders     None        Note:  This document was prepared using Dragon voice recognition software and may include unintentional dictation errors.    Willy Eddy, MD 04/22/23 2111

## 2023-04-22 NOTE — Assessment & Plan Note (Signed)
-   The patient is status post antiviral course with molnupiravir. - We will continue steroid therapy given her hypoxia though this is more related to her acute CHF. - Mucolytic therapy will be provided. - Vitamin C and zinc sulfate will be given.

## 2023-04-22 NOTE — Assessment & Plan Note (Signed)
Continue amiodarone. 

## 2023-04-22 NOTE — H&P (Signed)
[place]   PATIENT NAME: Kristi Barrera    MR#:  [id number]  DATE OF BIRTH:  [birth year]  DATE OF ADMISSION:  04/22/2023  PRIMARY CARE PHYSICIAN: Marguarite Arbour, MD   Patient is coming from: Home  REQUESTING/REFERRING PHYSICIAN: Willy Eddy, MD  CHIEF COMPLAINT:   Chief Complaint  Patient presents with   Generalized Body Aches    HISTORY OF PRESENT ILLNESS:  Kristi Barrera is a 82 y.o. female with medical history significant for anxiety, osteoarthritis, GERD, hypertension, dyslipidemia, hypothyroidism, sarcoidosis, OSA and ulcerative colitis, who presented to the emergency room with acute onset of generalized weakness with poor appetite.  Her husband was diagnosed with COVID on Thursday and she started having symptoms on Saturday with chills as well as sore throat and dry cough.  A couple days later she had loss of taste and has been having nauseated with occasional diarrhea.  She was given molnupiravir by her PCP that she finished today.  She felt initially better however later on she started feeling weaker and worse.  She had low-grade fever for a couple of days with mild chills.  No chest pain or palpitations.  No dysuria, oliguria or hematuria or flank pain.  At 4 AM she felt her heart was skipping beats and she woke up and took Tums with water and was able to go to bed after that.  ED Course: When the patient came to the ER, BP was 186/93 with attention of 97.5 and heart rate of 58 and otherwise normal vital signs.  She later on was hypoxic to 88% on room air that came up to 97% on 3 L of O2 by nasal cannula after being given a total of 1 L bolus of IV normal saline.  Labs revealed hyponatremia of 122 with hypochloremia of 87 and otherwise unremarkable CMP.  High sensitive troponin was 6 and CBC was within normal.  COVID-19 PCR came back positive and influenza and RSV PCR came back negative.  EKG as reviewed by me : EKG showed sinus rhythm with rate of  68 with premature supraventricular complexes and left bundle branch block.. Imaging: Portable chest x-ray showed interval increase central vascular prominence and interstitial consolidation consistent with CHF or fluid overload with small pleural effusions beginning to form and background chronic interstitial changes.  The patient was given 10 mg of IV dexamethasone, 500 mL IV normal saline bolus twice, nebulized albuterol 2.5 mg and later 40 mg of IV Lasix.  She will be admitted to progressive unit bed for further evaluation and management. PAST MEDICAL HISTORY:   Past Medical History:  Diagnosis Date   Abnormal cardiac valve    LEAKING HEART VALVE   Anxiety    Arthritis    Chest pain    Chronic low back pain    Colitis, chronic, ulcerative (HCC)    Complication of anesthesia    slow to wake after colonoscopy   Dysrhythmia    Family history of adverse reaction to anesthesia    family members slow to wake and confusion   GERD (gastroesophageal reflux disease)    HTN (hypertension)    Hyperlipidemia    Hypothyroidism    LBBB (left bundle branch block)    Low blood potassium    PAF (paroxysmal atrial fibrillation) (HCC)    Palpitations    Recurrent UTI    Sarcoidosis    [place] 15-10yrs ago   Sinusitis    Skin cancer    Sleep  apnea    cpap with 2 l o2   Thyroid disease    Vitamin D deficiency    Wears dentures    full upper    PAST SURGICAL HISTORY:   Past Surgical History:  Procedure Laterality Date   BREAST CYST ASPIRATION Left    neg   BREAST CYST ASPIRATION Right 09/25/2019   CATARACT EXTRACTION W/PHACO Right 04/06/2017   Procedure: CATARACT EXTRACTION PHACO AND INTRAOCULAR LENS PLACEMENT (IOC);  Surgeon: Galen Manila, MD;  Location: [facility] ORS;  Service: Ophthalmology;  Laterality: Right;  [place] 00:36.1 AP% 18.5 CDE 6.66 fluid Pack lot # [id number]   CATARACT EXTRACTION W/PHACO Left 04/13/2022   Procedure: CATARACT EXTRACTION PHACO AND INTRAOCULAR  LENS PLACEMENT (IOC) LEFT 8.82 00:40.8;  Surgeon: Nevada Crane, MD;  Location: [facility];  Service: Ophthalmology;  Laterality: Left;  sleep apnea   COLONOSCOPY WITH PROPOFOL N/A 08/06/2015   Procedure: COLONOSCOPY WITH PROPOFOL;  Surgeon: Scot Jun, MD;  Location: [facility] ENDOSCOPY;  Service: Endoscopy;  Laterality: N/A;   COLONOSCOPY WITH PROPOFOL N/A 01/15/2016   Procedure: COLONOSCOPY WITH PROPOFOL;  Surgeon: Scot Jun, MD;  Location: [facility] ENDOSCOPY;  Service: Endoscopy;  Laterality: N/A;   Urinary procedure      SOCIAL HISTORY:   Social History   Tobacco Use   Smoking status: Never   Smokeless tobacco: Never  Substance Use Topics   Alcohol use: No    FAMILY HISTORY:   Family History  Problem Relation Age of Onset   Heart disease Father        MI   Stroke Father    Pneumonia Father    Hypertension Father    Stroke Mother    Rheum arthritis Mother    Breast cancer Neg Hx     DRUG ALLERGIES:   Allergies  Allergen Reactions   Iohexol Shortness Of Breath   Sulfa Antibiotics Anaphylaxis and Swelling   Clonidine Derivatives     Swelling, chest tightness   Floxin [Ofloxacin] Other (See Comments)    Hallucinations    Hydralazine Hcl     BP dropped, HR increased, weakness, nausea, syncope   Lotrel [Amlodipine Besy-Benazepril Hcl] Itching   Potassium-Containing Compounds Other (See Comments)    Patient states she is allergic to oral Potassium Chloride only, had chest pains and a slight rash    Protonix [Pantoprazole Sodium] Swelling   Toprol Xl [Metoprolol Succinate]     fatigue    REVIEW OF SYSTEMS:   ROS As per history of present illness. All pertinent systems were reviewed above. Constitutional, HEENT, cardiovascular, respiratory, GI, GU, musculoskeletal, neuro, psychiatric, endocrine, integumentary and hematologic systems were reviewed and are otherwise negative/unremarkable except for positive findings mentioned above in the  HPI.   MEDICATIONS AT HOME:   Prior to Admission medications   Medication Sig Start Date End Date Taking? Authorizing Provider  ALPRAZolam (XANAX) 0.25 MG tablet Take 0.125-0.25 mg by mouth See admin instructions. Take 0.125mg  in the morning and 0.25mg  at bedtime.  May take an additional 0.25mg  as needed for anxiety.    [provider]  calcium carbonate (TUMS - DOSED IN MG ELEMENTAL CALCIUM) 500 MG chewable tablet Chew 1-2 tablets by mouth 3 (three) times daily as needed for indigestion or heartburn.    [provider]  dicyclomine (BENTYL) 20 MG tablet Take 10 mg by mouth 2 (two) times daily. 07/24/21   [provider]  ELIQUIS 5 MG TABS tablet Take 1 tablet by mouth 2 (two)  times daily. 07/02/21   [provider]  isosorbide mononitrate (IMDUR) 60 MG 24 hr tablet Take 60 mg by mouth 2 (two) times daily. 07/24/21   [provider]  metoprolol succinate (TOPROL XL) 25 MG 24 hr tablet Take 1 tablet (25 mg total) by mouth daily. 02/19/23   Antonieta Iba, MD  metoprolol tartrate (LOPRESSOR) 25 MG tablet Take 25 mg by mouth daily as needed.    [provider]  OXYGEN Inhale 2 L into the lungs at bedtime.    [provider]  spironolactone (ALDACTONE) 25 MG tablet Take 0.5 tablets (12.5 mg total) by mouth daily. 08/02/21 02/19/23  Charise Killian, MD  SYNTHROID 100 MCG tablet Take 100 mcg by mouth daily before breakfast. 06/08/21   [provider]  valsartan (DIOVAN) 160 MG tablet Take 160 mg by mouth 2 (two) times daily. 06/21/21   [provider]  verapamil (CALAN-SR) 120 MG CR tablet Take 1 tablet (120 mg total) by mouth daily. 03/23/23   Antonieta Iba, MD  Vitamin D, Ergocalciferol, (DRISDOL) 1.25 MG (50000 UNIT) CAPS capsule Take 50,000 Units by mouth 2 (two) times a week. 04/29/21   [provider]      VITAL SIGNS:  Blood pressure (!) 171/61, pulse 64, temperature 97.8 F (36.6 C), temperature source  Oral, resp. rate 18, height 5\' 2"  (1.575 m), weight 68.5 kg, SpO2 99%.  PHYSICAL EXAMINATION:  Physical Exam  GENERAL:  82 y.o.-year-old patient lying in the bed with mild respiratory stress with conversational dyspnea. EYES: Pupils equal, round, reactive to light and accommodation. No scleral icterus. Extraocular muscles intact.  HEENT: Head atraumatic, normocephalic. Oropharynx and nasopharynx clear.  NECK:  Supple, no jugular venous distention. No thyroid enlargement, no tenderness.  LUNGS: Diminished bibasal breath sounds with bibasal rales.  No use of accessory muscles of respiration.  CARDIOVASCULAR: Regular rate and rhythm, S1, S2 normal. No murmurs, rubs, or gallops.  ABDOMEN: Soft, nondistended, nontender. Bowel sounds present. No organomegaly or mass.  EXTREMITIES: No pedal edema, cyanosis, or clubbing.  NEUROLOGIC: Cranial nerves II through XII are intact. Muscle strength 5/5 in all extremities. Sensation intact. Gait not checked.  PSYCHIATRIC: The patient is alert and oriented x 3.  Normal affect and good eye contact. SKIN: No obvious rash, lesion, or ulcer.   LABORATORY PANEL:   CBC Recent Labs  Lab 04/22/23 1628  WBC 4.2  HGB 12.9  HCT 36.6  PLT 210   ------------------------------------------------------------------------------------------------------------------  Chemistries  Recent Labs  Lab 04/22/23 1628  NA 122*  K 4.3  CL 87*  CO2 24  GLUCOSE 95  BUN 15  CREATININE 1.03*  CALCIUM 9.0  AST 21  ALT 16  ALKPHOS 46  BILITOT 0.9   ------------------------------------------------------------------------------------------------------------------  Cardiac Enzymes No results for input(s): "TROPONINI" in the last 168 hours. ------------------------------------------------------------------------------------------------------------------  RADIOLOGY:  DG Chest Portable 1 View  Result Date: 04/22/2023 CLINICAL DATA:  Shortness of breath.  Check edema.  EXAM: PORTABLE CHEST 1 VIEW COMPARISON:  Portable chest 10/29/2022 FINDINGS: There is mild cardiomegaly. Interval increased central vascular prominence and generalized interstitial consolidation noted consistent with CHF or fluid overload with small pleural effusions beginning to form. There are background chronic interstitial changes. No focal airspace infiltrate is seen. The mediastinum is normally outlined. Aortic atherosclerosis. Osteopenia. IMPRESSION: 1. Interval increased central vascular prominence and interstitial consolidation consistent with CHF or fluid overload with small pleural effusions beginning to form. 2. Background chronic interstitial changes. Electronically Signed  By: Almira Bar M.D.   On: 04/22/2023 20:50      IMPRESSION AND PLAN:  Assessment and Plan: * Acute on chronic diastolic CHF (congestive heart failure) (HCC)  -The patient will be admitted to a progressive unit bed. - We will continue diuresis with IV Lasix. - We Will follow serial troponins. - We will follow I's and O's and daily weights. - Cardiology consult be obtained. - I notified [facility] about the patient.   COVID-19 virus infection - The patient is status post antiviral course with molnupiravir. - We will continue steroid therapy given her hypoxia though this is more related to her acute CHF. - Mucolytic therapy will be provided. - Vitamin C and zinc sulfate will be given.  Acute respiratory failure with hypoxia (HCC) - This is most likely related to #1 given its occurrence after fluid overload less likely associated with her COVID. - O2 protocol will be followed. - Management otherwise as above.  Hyponatremia - Will obtain hyponatremia workup. - This is likely hypovolemic due to diminished p.o. intake.  Essential hypertension - We will continue antihypertensive therapy.  Paroxysmal atrial fibrillation (HCC) - Continue amiodarone.  Anxiety - We will continue Xanax.  Hypothyroidism - We  will continue Synthroid.     DVT prophylaxis: Lovenox.  Advanced Care Planning:  Code Status: full code.  Family Communication:  The plan of care was discussed in details with the patient (and family). I answered all questions. The patient agreed to proceed with the above mentioned plan. Further management will depend upon hospital course. Disposition Plan: Back to previous home environment Consults called: none.  All the records are reviewed and case discussed with ED provider.  Status is: Inpatient  At the time of the admission, it appears that the appropriate admission status for this patient is inpatient.  This is judged to be reasonable and necessary in order to provide the required intensity of service to ensure the patient's safety given the presenting symptoms, physical exam findings and initial radiographic and laboratory data in the context of comorbid conditions.  The patient requires inpatient status due to high intensity of service, high risk of further deterioration and high frequency of surveillance required.  I certify that at the time of admission, it is my clinical judgment that the patient will require inpatient hospital care extending more than 2 midnights.                            Dispo: The patient is from: Home              Anticipated d/c is to: Home              Patient currently is not medically stable to d/c.              Difficult to place patient: No  Hannah Beat M.D on 04/22/2023 at 11:09 PM  [facility]   From 7 PM-7 AM, contact night-coverage [email or web address]  CC: Primary care physician; Marguarite Arbour, MD

## 2023-04-22 NOTE — ED Triage Notes (Signed)
Pt sts that her husband had COVID so pt is prophylactically being treated for it as well. Pt sts that she has been having GI upset as well as feeling weak and having body aches.

## 2023-04-22 NOTE — ED Notes (Addendum)
Pt reporting sudden onset shortness of breath and dyspnea. Pt oxygen dropped to 88% on RA. Pt placed on 3L [place] with improvement of SPO2 to 94%, no change in [facility]. Fluids also stopped at this time. Pt received approx NS of second order. MD made aware.

## 2023-04-22 NOTE — Assessment & Plan Note (Signed)
-   We will continue Xanax. 

## 2023-04-22 NOTE — Assessment & Plan Note (Signed)
-   Will obtain hyponatremia workup. - This is likely hypovolemic due to diminished p.o. intake.

## 2023-04-22 NOTE — Assessment & Plan Note (Signed)
-   This is most likely related to #1 given its occurrence after fluid overload less likely associated with her COVID. - O2 protocol will be followed. - Management otherwise as above.

## 2023-04-22 NOTE — Assessment & Plan Note (Signed)
-   We will continue Synthroid. 

## 2023-04-23 ENCOUNTER — Inpatient Hospital Stay (HOSPITAL_COMMUNITY)
Admit: 2023-04-23 | Discharge: 2023-04-23 | Disposition: A | Discharge status: Home / Self Care | Payer: Medicare Other | Attending: Cardiology | Admitting: Cardiology

## 2023-04-23 DIAGNOSIS — I480 Paroxysmal atrial fibrillation: Secondary | ICD-10-CM | POA: Diagnosis not present

## 2023-04-23 DIAGNOSIS — U071 COVID-19: Secondary | ICD-10-CM | POA: Diagnosis not present

## 2023-04-23 DIAGNOSIS — E039 Hypothyroidism, unspecified: Secondary | ICD-10-CM

## 2023-04-23 DIAGNOSIS — E871 Hypo-osmolality and hyponatremia: Secondary | ICD-10-CM | POA: Diagnosis not present

## 2023-04-23 DIAGNOSIS — F419 Anxiety disorder, unspecified: Secondary | ICD-10-CM

## 2023-04-23 DIAGNOSIS — I5033 Acute on chronic diastolic (congestive) heart failure: Secondary | ICD-10-CM | POA: Diagnosis not present

## 2023-04-23 DIAGNOSIS — J9601 Acute respiratory failure with hypoxia: Secondary | ICD-10-CM | POA: Diagnosis not present

## 2023-04-23 LAB — ECHOCARDIOGRAM COMPLETE
AR max vel: 2.27 cm2
AV Area VTI: 2.51 cm2
AV Area mean vel: 2.13 cm2
AV Mean grad: 3 mm[Hg]
AV Peak grad: 6.5 mm[Hg]
Ao pk vel: 1.27 m/s
Area-P 1/2: 2.66 cm2
Height: 62 in
MV VTI: 2.4 cm2
P 1/2 time: 719 ms
S' Lateral: 2.9 cm
Weight: 2416 [oz_av]

## 2023-04-23 LAB — CBC
HCT: 40.7 % (ref 36.0–46.0)
Hemoglobin: 14.4 g/dL (ref 12.0–15.0)
MCH: 30.3 pg (ref 26.0–34.0)
MCHC: 35.4 g/dL (ref 30.0–36.0)
MCV: 85.5 fL (ref 80.0–100.0)
Platelets: 227 10*3/uL (ref 150–400)
RBC: 4.76 MIL/uL (ref 3.87–5.11)
RDW: 10.9 % — ABNORMAL LOW (ref 11.5–15.5)
WBC: 2.8 10*3/uL — ABNORMAL LOW (ref 4.0–10.5)
nRBC: 0 % (ref 0.0–0.2)

## 2023-04-23 LAB — BASIC METABOLIC PANEL
Anion gap: 11 (ref 5–15)
BUN: 15 mg/dL (ref 8–23)
CO2: 24 mmol/L (ref 22–32)
Calcium: 9.3 mg/dL (ref 8.9–10.3)
Chloride: 92 mmol/L — ABNORMAL LOW (ref 98–111)
Creatinine, Ser: 1.04 mg/dL — ABNORMAL HIGH (ref 0.44–1.00)
GFR, Estimated: 55 mL/min — ABNORMAL LOW (ref >60)
Glucose, Bld: 143 mg/dL — ABNORMAL HIGH (ref 70–99)
Potassium: 4.7 mmol/L (ref 3.5–5.1)
Sodium: 127 mmol/L — ABNORMAL LOW (ref 135–145)

## 2023-04-23 LAB — BRAIN NATRIURETIC PEPTIDE: B Natriuretic Peptide: 412.5 pg/mL — ABNORMAL HIGH (ref 0.0–100.0)

## 2023-04-23 MED ORDER — VERAPAMIL HCL ER 120 MG PO TBCR
120.0000 mg | EXTENDED_RELEASE_TABLET | Freq: Every day | ORAL | Status: DC
  Administered 2023-04-23 – 2023-04-24 (×2): 120 mg via ORAL
  Filled 2023-04-23 (×2): qty 1

## 2023-04-23 MED ORDER — PREDNISONE 20 MG PO TABS
20.0000 mg | ORAL_TABLET | Freq: Every day | ORAL | Status: AC
  Administered 2023-04-24: 20 mg via ORAL
  Filled 2023-04-23: qty 1

## 2023-04-23 MED ORDER — FUROSEMIDE 20 MG PO TABS
20.0000 mg | ORAL_TABLET | Freq: Every day | ORAL | Status: DC
  Administered 2023-04-24: 20 mg via ORAL
  Filled 2023-04-23: qty 1

## 2023-04-23 NOTE — TOC Initial Note (Signed)
Transition of Care [facility]) - Initial/Assessment Note    Patient Details  Name: Kristi Barrera MRN: [id number] Date of Birth: 1941-04-17  [facility] CM/SW Contact:    Marquita Palms, LCSW Phone Number: 04/23/2023, 10:54 AM  Clinical Narrative:                  CSW awaiting PT and OT evaluation for recommendation of SNF placement.         Patient Goals and CMS Choice            Expected Discharge Plan and Services                                              Prior Living Arrangements/Services                       Activities of Daily Living      Permission Sought/Granted                  Emotional Assessment              Admission diagnosis:  Acute CHF (congestive heart failure) (HCC) [I50.9] Patient Active Problem List   Diagnosis Date Noted   Acute CHF (congestive heart failure) (HCC) 04/22/2023   Acute on chronic diastolic CHF (congestive heart failure) (HCC) 04/22/2023   COVID-19 virus infection 04/22/2023   Acute respiratory failure with hypoxia (HCC) 04/22/2023   Paroxysmal atrial fibrillation (HCC) 04/22/2023   Essential hypertension 04/22/2023   Hyponatremia 04/22/2023   Rapid atrial fibrillation (HCC) 07/30/2021   Hyperlipidemia    Hypothyroidism    Anxiety    Atrial fibrillation with RVR (HCC)    Atrial fibrillation with rapid ventricular response (HCC)    HTN (hypertension)    Hypertensive urgency    Left bundle branch block 04/17/2019   GI bleed 08/02/2015   Nocturnal hypoxemia 02/14/2013   OSA (obstructive sleep apnea) 02/29/2012   PAH (pulmonary artery hypertension) (HCC) 11/17/2011   Sarcoidosis (HCC) 11/17/2011   PCP:  Marguarite Arbour, MD Pharmacy:   [facility] [address], [place] - [address] [place] [address] Phone: [phone] Fax: [phone]     Social Determinants of Health (SDOH) Social History: SDOH Screenings   Food  Insecurity: No Food Insecurity (04/22/2023)   Received from [facility]  Transportation Needs: No Transportation Needs (04/22/2023)   Received from [facility]  Utilities: Not At Risk (04/22/2023)   Received from [facility]  Financial Resource Strain: Low Risk  (04/22/2023)   Received from [facility]  Tobacco Use: Low Risk  (04/22/2023)   Received from [facility]   SDOH Interventions:     Readmission Risk Interventions     No data to display

## 2023-04-23 NOTE — Progress Notes (Signed)
99 mg/dL 161  95  096   BUN 8 - 23 mg/dL 15  15  18    Creatinine 0.44 - 1.00 mg/dL 0.45  4.09  8.11   Sodium 135 - 145 mmol/L 127  122  135   Potassium 3.5 - 5.1 mmol/L 4.7  4.3  4.3   Chloride 98 - 111 mmol/L 92  87  99   CO2 22 - 32 mmol/L 24  24  28    Calcium 8.9 - 10.3 mg/dL 9.3  9.0  91.4    ECHOCARDIOGRAM COMPLETE  Result Date: 04/23/2023    ECHOCARDIOGRAM REPORT   Patient Name:   Kristi Barrera Date of Exam: 04/23/2023 Medical  Rec #:  [id number]              Height:       62.0 in Accession #:    [id number]             Weight:       151.0 lb Date of Birth:  1941/02/03             BSA:          1.696 m Patient Age:    82 years               BP:           186/93 mmHg Patient Gender: F                      HR:           65 bpm. Exam Location:  [facility] Procedure: 2D Echo, Cardiac Doppler and Color Doppler Indications:     Congestive Heart Failure I50.9  History:         Patient has prior history of Echocardiogram examinations.                  Signs/Symptoms:Chest Pain; Risk Factors:Hypertension.  Sonographer:     Neysa Bonito Roar Referring Phys:  [id number] Kristi Barrera Diagnosing Phys: Debbe Odea MD IMPRESSIONS  1. Left ventricular ejection fraction, by estimation, is 55 to 60%. The left ventricle has normal function. The left ventricle has no regional wall motion abnormalities. There is mild left ventricular hypertrophy. Left ventricular diastolic parameters are consistent with Grade I diastolic dysfunction (impaired relaxation).  2. Right ventricular systolic function is normal. The right ventricular size is normal.  3. The mitral valve is normal in structure. Mild mitral valve regurgitation.  4. The aortic valve is tricuspid. Aortic valve regurgitation is mild to moderate.  5. The inferior vena cava is normal in size with greater than 50% respiratory variability, suggesting right atrial pressure of 3 mmHg. FINDINGS  Left Ventricle: Left ventricular ejection fraction, by estimation, is 55 to 60%. The left ventricle has normal function. The left ventricle has no regional wall motion abnormalities. The left ventricular internal cavity size was normal in size. There is  mild left ventricular hypertrophy. Left ventricular diastolic parameters are consistent with Grade I diastolic dysfunction (impaired relaxation). Right Ventricle: The right ventricular size is normal. No increase in right ventricular wall thickness. Right ventricular systolic  function is normal. Left Atrium: Left atrial size was normal in size. Right Atrium: Right atrial size was normal in size. Pericardium: There is no evidence of pericardial effusion. Mitral Valve: The mitral valve is normal in structure. Mild mitral valve regurgitation. MV peak gradient, 4.2 mmHg. The mean mitral valve gradient is 1.0 mmHg. Tricuspid Valve: The tricuspid valve  Progress Note   Patient: Kristi Barrera ZOX:[id number] DOB: [birth year] DOA: 04/22/2023     1 DOS: the patient was seen and examined on 04/23/2023   Brief hospital course: Kristi Barrera is a 82 y.o. female with medical history significant for anxiety, osteoarthritis, GERD, hypertension, dyslipidemia, hypothyroidism, sarcoidosis, OSA on CPAP and ulcerative colitis, who presented to the emergency room with acute onset of generalized weakness with poor appetite.  Her husband was diagnosed with COVID on Thursday and she started having symptoms on Saturday with chills as well as sore throat and dry cough. She finished Monupiravir therapy. Also felt palpitations last night presented to ED for further management. In ED COVID test positive. She did get IV fluids in ED, chest xray showed interstitial edema was give IV lasix, cardiology consulted.  Assessment and Plan: * Acute on chronic diastolic CHF (congestive heart failure) (HCC) BNP 412. Echo reviewed shows EF 60%, grade 1 diastolic dysfunction. Cardiology consult appreciated. Serial troponins flat. Continue diuresis with IV Lasix BID per cardiology recommendations. Monitor strict I's and O's and daily weights.  COVID-19 virus infection Status post antiviral course with molnupiravir. Covid test in ED positive. Continue isolation precautions. Taper steroid therapy given her hypoxia improved with CHF treatment. Mucolytic therapy will be provided. Continue Vitamin C and zinc sulfate  Acute respiratory failure with hypoxia (HCC) Due to fluid overload from CHF exacerbation. Continue supplemental O2 as needed to maintain saturation above 92%. IV diuresis. Out of bed, incentive spirometry. Supportive therapy.  Hyponatremia Sodium improved to 127. Encourage fluid restriction. Trend Sodium.  Essential hypertension Continue Irbesartan therapy.  Paroxysmal atrial fibrillation (HCC) Continue amiodarone,  eliquis.  Anxiety Continue Xanax.  Hypothyroidism Home Synthroid resumed.    Out of bed to chair. Incentive spirometry. Nursing supportive care. Fall, aspiration precautions. DVT prophylaxis   Code Status: Full Code  Subjective: Patient is seen and examined today morning. She is lying in bed, states she ate better. Appetite improved. Able to get out of bed. Has cough, taste alteration. Off supplemental O2.  Physical Exam: Vitals:   04/23/23 0801 04/23/23 1000 04/23/23 1203 04/23/23 1220  BP:  (!) 108/93 (!) 155/65   Pulse:  (!) 58 (!) 58   Resp:  18 18   Temp: 97.6 F (36.4 C)   97.7 F (36.5 C)  TempSrc: Oral   Oral  SpO2:  97% 98%   Weight:      Height:        General - Elderly Caucasian female, no apparent distress HEENT - PERRLA, EOMI, atraumatic head, non tender sinuses. Lung - Clear, diffuse rales, bibasal rhonchi, no wheezes. Heart - S1, S2 heard, no murmurs, rubs, trace pedal edema. Abdomen - Soft, non tender, non distended, bowel sounds good Neuro - Alert, awake and oriented x 3, non focal exam. Skin - Warm and dry.  Data Reviewed:      Latest Ref Rng & Units 04/23/2023    5:29 AM 04/22/2023    4:28 PM 01/10/2023   10:11 AM  CBC  WBC 4.0 - 10.5 K/uL 2.8  4.2  6.2   Hemoglobin 12.0 - 15.0 g/dL 81.1  91.4  78.2   Hematocrit 36.0 - 46.0 % 40.7  36.6  41.3   Platelets 150 - 400 K/uL 227  210  277       Latest Ref Rng & Units 04/23/2023    5:29 AM 04/22/2023    4:28 PM 01/10/2023   10:11 AM  BMP  Glucose 70 -  Progress Note   Patient: Kristi Barrera ZOX:[id number] DOB: [birth year] DOA: 04/22/2023     1 DOS: the patient was seen and examined on 04/23/2023   Brief hospital course: Kristi Barrera is a 82 y.o. female with medical history significant for anxiety, osteoarthritis, GERD, hypertension, dyslipidemia, hypothyroidism, sarcoidosis, OSA on CPAP and ulcerative colitis, who presented to the emergency room with acute onset of generalized weakness with poor appetite.  Her husband was diagnosed with COVID on Thursday and she started having symptoms on Saturday with chills as well as sore throat and dry cough. She finished Monupiravir therapy. Also felt palpitations last night presented to ED for further management. In ED COVID test positive. She did get IV fluids in ED, chest xray showed interstitial edema was give IV lasix, cardiology consulted.  Assessment and Plan: * Acute on chronic diastolic CHF (congestive heart failure) (HCC) BNP 412. Echo reviewed shows EF 60%, grade 1 diastolic dysfunction. Cardiology consult appreciated. Serial troponins flat. Continue diuresis with IV Lasix BID per cardiology recommendations. Monitor strict I's and O's and daily weights.  COVID-19 virus infection Status post antiviral course with molnupiravir. Covid test in ED positive. Continue isolation precautions. Taper steroid therapy given her hypoxia improved with CHF treatment. Mucolytic therapy will be provided. Continue Vitamin C and zinc sulfate  Acute respiratory failure with hypoxia (HCC) Due to fluid overload from CHF exacerbation. Continue supplemental O2 as needed to maintain saturation above 92%. IV diuresis. Out of bed, incentive spirometry. Supportive therapy.  Hyponatremia Sodium improved to 127. Encourage fluid restriction. Trend Sodium.  Essential hypertension Continue Irbesartan therapy.  Paroxysmal atrial fibrillation (HCC) Continue amiodarone,  eliquis.  Anxiety Continue Xanax.  Hypothyroidism Home Synthroid resumed.    Out of bed to chair. Incentive spirometry. Nursing supportive care. Fall, aspiration precautions. DVT prophylaxis   Code Status: Full Code  Subjective: Patient is seen and examined today morning. She is lying in bed, states she ate better. Appetite improved. Able to get out of bed. Has cough, taste alteration. Off supplemental O2.  Physical Exam: Vitals:   04/23/23 0801 04/23/23 1000 04/23/23 1203 04/23/23 1220  BP:  (!) 108/93 (!) 155/65   Pulse:  (!) 58 (!) 58   Resp:  18 18   Temp: 97.6 F (36.4 C)   97.7 F (36.5 C)  TempSrc: Oral   Oral  SpO2:  97% 98%   Weight:      Height:        General - Elderly Caucasian female, no apparent distress HEENT - PERRLA, EOMI, atraumatic head, non tender sinuses. Lung - Clear, diffuse rales, bibasal rhonchi, no wheezes. Heart - S1, S2 heard, no murmurs, rubs, trace pedal edema. Abdomen - Soft, non tender, non distended, bowel sounds good Neuro - Alert, awake and oriented x 3, non focal exam. Skin - Warm and dry.  Data Reviewed:      Latest Ref Rng & Units 04/23/2023    5:29 AM 04/22/2023    4:28 PM 01/10/2023   10:11 AM  CBC  WBC 4.0 - 10.5 K/uL 2.8  4.2  6.2   Hemoglobin 12.0 - 15.0 g/dL 81.1  91.4  78.2   Hematocrit 36.0 - 46.0 % 40.7  36.6  41.3   Platelets 150 - 400 K/uL 227  210  277       Latest Ref Rng & Units 04/23/2023    5:29 AM 04/22/2023    4:28 PM 01/10/2023   10:11 AM  BMP  Glucose 70 -  99 mg/dL 161  95  096   BUN 8 - 23 mg/dL 15  15  18    Creatinine 0.44 - 1.00 mg/dL 0.45  4.09  8.11   Sodium 135 - 145 mmol/L 127  122  135   Potassium 3.5 - 5.1 mmol/L 4.7  4.3  4.3   Chloride 98 - 111 mmol/L 92  87  99   CO2 22 - 32 mmol/L 24  24  28    Calcium 8.9 - 10.3 mg/dL 9.3  9.0  91.4    ECHOCARDIOGRAM COMPLETE  Result Date: 04/23/2023    ECHOCARDIOGRAM REPORT   Patient Name:   Kristi Barrera Date of Exam: 04/23/2023 Medical  Rec #:  [id number]              Height:       62.0 in Accession #:    [id number]             Weight:       151.0 lb Date of Birth:  1941/02/03             BSA:          1.696 m Patient Age:    82 years               BP:           186/93 mmHg Patient Gender: F                      HR:           65 bpm. Exam Location:  [facility] Procedure: 2D Echo, Cardiac Doppler and Color Doppler Indications:     Congestive Heart Failure I50.9  History:         Patient has prior history of Echocardiogram examinations.                  Signs/Symptoms:Chest Pain; Risk Factors:Hypertension.  Sonographer:     Neysa Bonito Roar Referring Phys:  [id number] Kristi Barrera Diagnosing Phys: Debbe Odea MD IMPRESSIONS  1. Left ventricular ejection fraction, by estimation, is 55 to 60%. The left ventricle has normal function. The left ventricle has no regional wall motion abnormalities. There is mild left ventricular hypertrophy. Left ventricular diastolic parameters are consistent with Grade I diastolic dysfunction (impaired relaxation).  2. Right ventricular systolic function is normal. The right ventricular size is normal.  3. The mitral valve is normal in structure. Mild mitral valve regurgitation.  4. The aortic valve is tricuspid. Aortic valve regurgitation is mild to moderate.  5. The inferior vena cava is normal in size with greater than 50% respiratory variability, suggesting right atrial pressure of 3 mmHg. FINDINGS  Left Ventricle: Left ventricular ejection fraction, by estimation, is 55 to 60%. The left ventricle has normal function. The left ventricle has no regional wall motion abnormalities. The left ventricular internal cavity size was normal in size. There is  mild left ventricular hypertrophy. Left ventricular diastolic parameters are consistent with Grade I diastolic dysfunction (impaired relaxation). Right Ventricle: The right ventricular size is normal. No increase in right ventricular wall thickness. Right ventricular systolic  function is normal. Left Atrium: Left atrial size was normal in size. Right Atrium: Right atrial size was normal in size. Pericardium: There is no evidence of pericardial effusion. Mitral Valve: The mitral valve is normal in structure. Mild mitral valve regurgitation. MV peak gradient, 4.2 mmHg. The mean mitral valve gradient is 1.0 mmHg. Tricuspid Valve: The tricuspid valve

## 2023-04-23 NOTE — Progress Notes (Signed)
  Echocardiogram 2D Echocardiogram has been performed.  Samy Ryner C Keeana Pieratt 04/23/2023, 10:11 AM

## 2023-04-23 NOTE — Consult Note (Signed)
Cardiology Consultation   Patient ID: YUVETTE POSTAL MRN: [id number]; DOB: [birth year]  Admit date: 04/22/2023 Date of Consult: 04/23/2023  PCP:  Marguarite Arbour, MD   [facility] Providers Cardiologist:  Julien Nordmann, MD        Patient Profile:   Kristi Barrera is a 82 y.o. female with a hx of paroxysmal atrial fibrillation, hypertension, hyperlipidemia, obstructive sleep apnea on CPAP, pulmonary hypertension chronic left bundle branch block, hypothyroidism, sarcoidosis, and anxiety who is being seen 04/23/2023 for the evaluation of acute on chronic HFpEF at the request of Dr Arville Care.  History of Present Illness:   Ms. Host had previously been followed by [facility].  Prior echocardiogram completed March 2022 revealed a normal left and right ventricular size and function with an EF of 50%, severe MR, and moderate TR.  Repeat echocardiogram January 2023 revealed normal LV function and mild MR.  Episode of atrial fibrillation with RVR was treated in the emergency department with medications in 10/2021 and converted to normal sinus rhythm.  Recently admitted on 01/10/2023 due to atrial fibrillation and notes waking up with heart flutters.  Verapamil dose was increased from 120 mg daily to 180 mg daily and metoprolol was added on an as-needed basis for tachycardia.  She converted back to normal sinus rhythm while in the emergency department.  She was last seen in clinic 02/19/2023 by Dr. Mariah Milling.  She was a referral from her PCP for paroxysmal atrial fibrillation.  She was continued on apixaban 5 mg twice daily.  She had preferred not to be on amiodarone daily as previously offered by [facility] clinic.  She was recommended to start metoprolol succinate 12.5 mg daily with titration of 25 mg daily as tolerated and continue with current dose of verapamil to 20 mg daily.  For breakthrough arrhythmia could consider flecainide 50 mg twice daily and 20 mg twice daily.  She recently had  coronary calcium scoring which revealed a score of 113.  At that time she had no symptoms of angina or decompensation.  Patient declined statin therapy.  There was no further workup needed at that time.  She presented to the [facility] emergency department 04/22/2023 with sudden onset of shortness of breath and dyspnea.  Oxygen saturation dropped to 88% on room air and she was subsequently placed on 3 L of O2 via nasal cannula with improvement in her SpO2 to 94%.  There was no change in her shortness of breath at that time.  Fluids were been stopped after she had started on the second bag due to worsening shortness of breath.  Patient stated that her husband has been recently diagnosed with COVID.  A couple days later she had lost taste and has been nauseated with occasional diarrhea.  She was started on antivirals by her PCP that she finished today.  She initially felt better however later on started feeling worse and endorsed increased weakness.  Her she did have a low-grade fever for couple days with mild chills.  She continues to deny chest pain or palpitations.  She had worsening fatigue, weakness, poor p.o. intake.Patient feels much better on exam this morning with no further episodes of shortness of breath. Family remains at the bedside.  Initial vitals: Blood pressure 186/93, pulse of 58, respirations 19, temperature 97.5  Pertinent labs: COVID PCR swab positive, sodium 122, chloride 87, serum creatinine 1.03, GFR 55  Imaging: Chest x-ray reveals interval increase central vascular prominence and interstitial consolidation consistent with CHF or  fluid overload with small pleural effusions beginning to form, background chronic interstitial changes  Medications administered in the emergency department: Normal saline bolus, dexamethasone 10 mg IV, Proventil, Lasix 40 mg IVP  Past Medical History:  Diagnosis Date   Abnormal cardiac valve    LEAKING HEART VALVE   Anxiety    Arthritis    Chest pain     Chronic low back pain    Colitis, chronic, ulcerative (HCC)    Complication of anesthesia    slow to wake after colonoscopy   Dysrhythmia    Family history of adverse reaction to anesthesia    family members slow to wake and confusion   GERD (gastroesophageal reflux disease)    HTN (hypertension)    Hyperlipidemia    Hypothyroidism    LBBB (left bundle branch block)    Low blood potassium    PAF (paroxysmal atrial fibrillation) (HCC)    Palpitations    Recurrent UTI    Sarcoidosis    [place] 15-56yrs ago   Sinusitis    Skin cancer    Sleep apnea    cpap with 2 l o2   Thyroid disease    Vitamin D deficiency    Wears dentures    full upper    Past Surgical History:  Procedure Laterality Date   BREAST CYST ASPIRATION Left    neg   BREAST CYST ASPIRATION Right 09/25/2019   CATARACT EXTRACTION W/PHACO Right 04/06/2017   Procedure: CATARACT EXTRACTION PHACO AND INTRAOCULAR LENS PLACEMENT (IOC);  Surgeon: Galen Manila, MD;  Location: [facility] ORS;  Service: Ophthalmology;  Laterality: Right;  [place] 00:36.1 AP% 18.5 CDE 6.66 fluid Pack lot # [id number]   CATARACT EXTRACTION W/PHACO Left 04/13/2022   Procedure: CATARACT EXTRACTION PHACO AND INTRAOCULAR LENS PLACEMENT (IOC) LEFT 8.82 00:40.8;  Surgeon: Nevada Crane, MD;  Location: [facility];  Service: Ophthalmology;  Laterality: Left;  sleep apnea   COLONOSCOPY WITH PROPOFOL N/A 08/06/2015   Procedure: COLONOSCOPY WITH PROPOFOL;  Surgeon: Scot Jun, MD;  Location: [facility] ENDOSCOPY;  Service: Endoscopy;  Laterality: N/A;   COLONOSCOPY WITH PROPOFOL N/A 01/15/2016   Procedure: COLONOSCOPY WITH PROPOFOL;  Surgeon: Scot Jun, MD;  Location: [facility] END[facility]COPY;  Service: Endoscopy;  Laterality: N/A;   Urinary procedure       Home Medications:  Prior to Admission medications   Medication Sig Start Date End Date Taking? Authorizing Provider  ALPRAZolam (XANAX) 0.25 MG tablet Take 0.125-0.25 mg by mouth  See admin instructions. Take 0.125mg  in the morning and 0.25mg  at bedtime.  May take an additional 0.25mg  as needed for anxiety.    [provider]  calcium carbonate (TUMS - DOSED IN MG ELEMENTAL CALCIUM) 500 MG chewable tablet Chew 1-2 tablets by mouth 3 (three) times daily as needed for indigestion or heartburn.    [provider]  dicyclomine (BENTYL) 20 MG tablet Take 10 mg by mouth 2 (two) times daily. 07/24/21   [provider]  ELIQUIS 5 MG TABS tablet Take 1 tablet by mouth 2 (two) times daily. 07/02/21   [provider]  isosorbide mononitrate (IMDUR) 60 MG 24 hr tablet Take 60 mg by mouth 2 (two) times daily. 07/24/21   [provider]  metoprolol succinate (TOPROL XL) 25 MG 24 hr tablet Take 1 tablet (25 mg total) by mouth daily. 02/19/23   Antonieta Iba, MD  metoprolol tartrate (LOPRESSOR) 25 MG tablet Take 25 mg by mouth daily as needed.  [provider]  OXYGEN Inhale 2 L into the lungs at bedtime.    [provider]  spironolactone (ALDACTONE) 25 MG tablet Take 0.5 tablets (12.5 mg total) by mouth daily. 08/02/21 02/19/23  Charise Killian, MD  SYNTHROID 100 MCG tablet Take 100 mcg by mouth daily before breakfast. 06/08/21   [provider]  valsartan (DIOVAN) 160 MG tablet Take 160 mg by mouth 2 (two) times daily. 06/21/21   [provider]  verapamil (CALAN-SR) 120 MG CR tablet Take 1 tablet (120 mg total) by mouth daily. 03/23/23   Antonieta Iba, MD  Vitamin D, Ergocalciferol, (DRISDOL) 1.25 MG (50000 UNIT) CAPS capsule Take 50,000 Units by mouth 2 (two) times a week. 04/29/21   [provider]    Inpatient Medications: Scheduled Meds:  ALPRAZolam  0.5 mg Oral QHS   apixaban  5 mg Oral BID   vitamin C  1,000 mg Oral Daily   dicyclomine  10 mg Oral BID   furosemide  40 mg Intravenous Q12H   guaiFENesin  600 mg Oral BID   irbesartan  150 mg Oral Daily   isosorbide mononitrate  60 mg  Oral BID   levothyroxine  100 mcg Oral QAC breakfast   methylPREDNISolone (SOLU-MEDROL) injection  40 mg Intravenous Daily   metoprolol succinate  25 mg Oral Daily   spironolactone  12.5 mg Oral Daily   verapamil  120 mg Oral Daily   [START ON 04/26/2023] Vitamin D (Ergocalciferol)  50,000 Units Oral Weekly   zinc sulfate  220 mg Oral Daily   Continuous Infusions:  PRN Meds: acetaminophen **OR** acetaminophen, calcium carbonate, chlorpheniramine-HYDROcodone, magnesium hydroxide, ondansetron **OR** ondansetron (ZOFRAN) IV, traZODone  Allergies:    Allergies  Allergen Reactions   Iohexol Shortness Of Breath   Sulfa Antibiotics Anaphylaxis and Swelling   Clonidine Derivatives     Swelling, chest tightness   Floxin [Ofloxacin] Other (See Comments)    Hallucinations    Hydralazine Hcl     BP dropped, HR increased, weakness, nausea, syncope   Lotrel [Amlodipine Besy-Benazepril Hcl] Itching   Potassium-Containing Compounds Other (See Comments)    Patient states she is allergic to oral Potassium Chloride only, had chest pains and a slight rash    Protonix [Pantoprazole Sodium] Swelling   Toprol Xl [Metoprolol Succinate]     fatigue    Social History:   Social History   Socioeconomic History   Marital status: Married    Spouse name: Not on file   Number of children: Not on file   Years of education: Not on file   Highest education level: Not on file  Occupational History   Occupation: retired  Tobacco Use   Smoking status: Never   Smokeless tobacco: Never  Vaping Use   Vaping status: Never Used  Substance and Sexual Activity   Alcohol use: No   Drug use: No   Sexual activity: Not on file  Other Topics Concern   Not on file  Social History Narrative   Not on file   Social Determinants of Health   Financial Resource Strain: Low Risk  (04/22/2023)   Received from [facility]   Overall Financial Resource Strain (CARDIA)    Difficulty of Paying Living  Expenses: Not hard at all  Food Insecurity: No Food Insecurity (04/22/2023)   Received from [facility]   Hunger Vital Sign    Worried About Running Out of Food in the Last Year: Never true  Ran Out of Food in the Last Year: Never true  Transportation Needs: No Transportation Needs (04/22/2023)   Received from [facility] - Transportation    In the past 12 months, has lack of transportation kept you from medical appointments or from getting medications?: No    Lack of Transportation (Non-Medical): No  Physical Activity: Not on file  Stress: Not on file  Social Connections: Not on file  Intimate Partner Violence: Not on file    Family History:    Family History  Problem Relation Age of Onset   Heart disease Father        MI   Stroke Father    Pneumonia Father    Hypertension Father    Stroke Mother    Rheum arthritis Mother    Breast cancer Neg Hx      ROS:  Please see the history of present illness.  Review of Systems  Constitutional:  Positive for chills, fever and malaise/fatigue.  Respiratory:  Positive for cough and shortness of breath.   Gastrointestinal:  Positive for diarrhea and nausea.  Neurological:  Positive for weakness.    All other ROS reviewed and negative.     Physical Exam/Data:   Vitals:   04/22/23 2240 04/23/23 0131 04/23/23 0700 04/23/23 0801  BP: (!) 171/61 (!) 144/65 (!) 143/61   Pulse: 64 84 (!) 48   Resp: 18 20 20    Temp: 97.8 F (36.6 C) 98.1 F (36.7 C)  97.6 F (36.4 C)  TempSrc: Oral Oral  Oral  SpO2: 99% 99% 99%   Weight:      Height:        Intake/Output Summary (Last 24 hours) at 04/23/2023 0819 Last data filed at 04/22/2023 2047 Gross per 24 hour  Intake 750 ml  Output --  Net 750 ml      04/22/2023    4:23 PM 02/19/2023    3:33 PM 02/19/2023    3:27 PM  Last 3 Weights  Weight (lbs) 151 lb 149 lb 4 oz 149 lb 4 oz  Weight (kg) 68.493 kg 67.699 kg 67.699 kg     Body mass  index is 27.62 kg/m.  General:  Well nourished, well developed, in no acute distress HEENT: normal Neck: no JVD Vascular: No carotid bruits; Distal pulses 2+ bilaterally Cardiac:  normal S1, S2; RRR; no murmur  Lungs:  clear with diminished bases to auscultation bilaterally, no wheezing, rhonchi or rales, respirations are unlabored at rest on room air Abd: soft, nontender, no hepatomegaly  Ext: no edema Musculoskeletal:  No deformities, BUE and BLE strength normal and equal Skin: warm and dry  Neuro:  CNs 2-12 intact, no focal abnormalities noted Psych:  Normal affect   EKG:  The EKG was personally reviewed and demonstrates: Sinus bradycardia with a rate of 59 with first-degree AV block and chronic left bundle branch block Telemetry:  Telemetry was personally reviewed and demonstrates:  sinus brady to sinus with rates 40-60  Relevant CV Studies: Coronary CTA 01/14/23 IMPRESSION AND RECOMMENDATION: 1. Coronary calcium score of 113. This was 53rd percentile for age and sex matched control.   2. CAC 1-99 in LAD, LCx. CAC-DRS A1/N2.   3. Consider aspirin and statin if no contraindication.   4. Continue heart healthy lifestyle and risk factor modification.  TTE 07/30/21 1. Left ventricular ejection fraction, by estimation, is 55 to 60%. The  left ventricle has normal function. The left ventricle has no regional  wall motion abnormalities. Left ventricular diastolic parameters are  consistent with Grade I diastolic  dysfunction (impaired relaxation).   2. Right ventricular systolic function is normal. The right ventricular  size is normal.   3. The mitral valve is degenerative. Mild mitral valve regurgitation.   4. The aortic valve is normal in structure. Aortic valve regurgitation is  trivial. No aortic stenosis is present.   5. The inferior vena cava is normal in size with greater than 50%  respiratory variability, suggesting right atrial pressure of 3 mmHg.   Echocardiogram 2D  complete:(10/02/20) INTERPRETATION NORMAL LEFT VENTRICULAR SYSTOLIC FUNCTION NORMAL RIGHT VENTRICULAR SYSTOLIC FUNCTION SEVERE VALVULAR REGURGITATION (See above) NO VALVULAR STENOSIS MODERATE TR SEVERE MR MILD AR EF 50%  NM Myocardial Perfusion SPECT multiple (stress and rest):(04/27/18)  IMPRESSION: Normal LV function. Excellent exercise tolerance with exercise-induced left bundle branch block. No evidence of reversible ischemia. EF normal. Low risk study.  Laboratory Data:  High Sensitivity Troponin:   Recent Labs  Lab 04/22/23 1627  TROPONINIHS 6     Chemistry Recent Labs  Lab 04/22/23 1628 04/23/23 0529  NA 122* 127*  K 4.3 4.7  CL 87* 92*  CO2 24 24  GLUCOSE 95 143*  BUN 15 15  CREATININE 1.03* 1.04*  CALCIUM 9.0 9.3  GFRNONAA 55* 55*  ANIONGAP 11 11    Recent Labs  Lab 04/22/23 1628  PROT 7.4  ALBUMIN 4.0  AST 21  ALT 16  ALKPHOS 46  BILITOT 0.9   Lipids No results for input(s): "CHOL", "TRIG", "HDL", "LABVLDL", "LDLCALC", "CHOLHDL" in the last 168 hours.  Hematology Recent Labs  Lab 04/22/23 1628 04/23/23 0529  WBC 4.2 2.8*  RBC 4.25 4.76  HGB 12.9 14.4  HCT 36.6 40.7  MCV 86.1 85.5  MCH 30.4 30.3  MCHC 35.2 35.4  RDW 10.9* 10.9*  PLT 210 227   Thyroid No results for input(s): "TSH", "FREET4" in the last 168 hours.  BNPNo results for input(s): "BNP", "PROBNP" in the last 168 hours.  DDimer No results for input(s): "DDIMER" in the last 168 hours.   Radiology/Studies:  DG Chest Portable 1 View  Result Date: 04/22/2023 CLINICAL DATA:  Shortness of breath.  Check edema. EXAM: PORTABLE CHEST 1 VIEW COMPARISON:  Portable chest 10/29/2022 FINDINGS: There is mild cardiomegaly. Interval increased central vascular prominence and generalized interstitial consolidation noted consistent with CHF or fluid overload with small pleural effusions beginning to form. There are background chronic interstitial changes. No focal airspace infiltrate is seen. The  mediastinum is normally outlined. Aortic atherosclerosis. Osteopenia. IMPRESSION: 1. Interval increased central vascular prominence and interstitial consolidation consistent with CHF or fluid overload with small pleural effusions beginning to form. 2. Background chronic interstitial changes. Electronically Signed   By: Almira Bar M.D.   On: 04/22/2023 20:50     Assessment and Plan:   Acute respiratory failure with hypoxia -Presented with shortness of breath -Requiring oxygen to maintain oxygen saturations -Chest x-ray concerning for increased central vascular prominence and interstitial consolidation consistent with CHF or fluid overload with small pleural effusions -IV fluids had to be stopped in the emergency department due to worsening shortness of breath -patient noted huge improvement after IVF was stopped and lasix was administered int he ED -no longer requiring oxygen to maintain oxygen saturations this morning  HFpEF -BNP added to labs -continued on furosemide 40 mg twice daily -NYHA II symptoms -daily BMP -echocardiogram ordered and pending with recommendations to follow -daily weight, I's&O's, low sodium diet  COVID-19 infection -Patient presented positive for COVID -She just recently finished antiviral course with molnupiravir -Continue management per IM  Paroxysmal atrial fibrillation -Sinus bradycardia noted on EKG -Continued on apixaban 5 mg twice daily for CHA2DS2-VASc of at least 5 for stroke prophylaxis. -Continued on verapamil and Toprol-XL -Continue with telemetry monitoring  Hypertension -Blood pressure 143/61 -Continue irbesartan 150 mg daily, Imdur 60 mg twice daily -vital signs per unit protocol  Hyponatremia -Serum sodium 127, slowly improved from admission with level of 122 -Daily BMP -Limit free water  Hypothyroidism -Continued on levothyroxine  Anxiety -Continued on alprazolam -Continued management per IM  Risk Assessment/Risk Scores:         New York Heart Association (NYHA) Functional Class NYHA Class II  CHA2DS2-VASc Score = 5   This indicates a 7.2% annual risk of stroke. The patient's score is based upon: CHF History: 1 HTN History: 1 Diabetes History: 0 Stroke History: 0 Vascular Disease History: 0 Age Score: 2 Gender Score: 1         For questions or updates, please contact [facility] Please consult [email or web address] for contact info under    Signed, Danyla Wattley, NP  04/23/2023 8:19 AM

## 2023-04-24 DIAGNOSIS — J9601 Acute respiratory failure with hypoxia: Secondary | ICD-10-CM | POA: Diagnosis not present

## 2023-04-24 DIAGNOSIS — U071 COVID-19: Secondary | ICD-10-CM | POA: Diagnosis not present

## 2023-04-24 DIAGNOSIS — E871 Hypo-osmolality and hyponatremia: Secondary | ICD-10-CM | POA: Diagnosis not present

## 2023-04-24 DIAGNOSIS — I5033 Acute on chronic diastolic (congestive) heart failure: Secondary | ICD-10-CM | POA: Diagnosis not present

## 2023-04-24 LAB — CBC
HCT: 33.6 % — ABNORMAL LOW (ref 36.0–46.0)
Hemoglobin: 12.4 g/dL (ref 12.0–15.0)
MCH: 30.4 pg (ref 26.0–34.0)
MCHC: 36.9 g/dL — ABNORMAL HIGH (ref 30.0–36.0)
MCV: 82.4 fL (ref 80.0–100.0)
Platelets: 279 10*3/uL (ref 150–400)
RBC: 4.08 MIL/uL (ref 3.87–5.11)
RDW: 10.7 % — ABNORMAL LOW (ref 11.5–15.5)
WBC: 8.8 10*3/uL (ref 4.0–10.5)
nRBC: 0 % (ref 0.0–0.2)

## 2023-04-24 LAB — BASIC METABOLIC PANEL
Anion gap: 8 (ref 5–15)
BUN: 25 mg/dL — ABNORMAL HIGH (ref 8–23)
CO2: 24 mmol/L (ref 22–32)
Calcium: 8.7 mg/dL — ABNORMAL LOW (ref 8.9–10.3)
Chloride: 91 mmol/L — ABNORMAL LOW (ref 98–111)
Creatinine, Ser: 1.18 mg/dL — ABNORMAL HIGH (ref 0.44–1.00)
GFR, Estimated: 47 mL/min — ABNORMAL LOW (ref >60)
Glucose, Bld: 131 mg/dL — ABNORMAL HIGH (ref 70–99)
Potassium: 3.9 mmol/L (ref 3.5–5.1)
Sodium: 123 mmol/L — ABNORMAL LOW (ref 135–145)

## 2023-04-24 MED ORDER — FUROSEMIDE 20 MG PO TABS: 20.0000 mg | ORAL_TABLET | Freq: Every day | ORAL | 0 refills | Status: DC

## 2023-04-24 MED ORDER — SODIUM CHLORIDE 1 G PO TABS: 1.0000 g | ORAL_TABLET | Freq: Every day | ORAL | 0 refills | Status: DC

## 2023-04-24 NOTE — Discharge Summary (Signed)
Physician Discharge Summary   Patient: Kristi Barrera MRN: [id number] DOB: [birth year]  Admit date:     04/22/2023  Discharge date: 04/24/23  Discharge Physician: Marcelino Duster   PCP: Marguarite Arbour, MD   Recommendations at discharge:  {Tip this will not be part of the note when signed- Example include specific recommendations for outpatient follow-up, pending tests to follow-up on. (Optional):26781}  PCP follow up in 1 week Follow BMP to trend sodium, she is advised to limit fluids to 1.5L, salt tab. Cardiology follow up as suggested.  Discharge Diagnoses: Principal Problem:   Acute on chronic diastolic CHF (congestive heart failure) (HCC) Active Problems:   COVID-19 virus infection   Acute respiratory failure with hypoxia (HCC)   Hyponatremia   Hypothyroidism   Anxiety   Paroxysmal atrial fibrillation (HCC)   Essential hypertension  Resolved Problems:   * No resolved hospital problems. [facility] Course: No notes on file  Assessment and Plan: * Acute on chronic diastolic CHF (congestive heart failure) (HCC)  -The patient will be admitted to a progressive unit bed. - We will continue diuresis with IV Lasix. - We Will follow serial troponins. - We will follow I's and O's and daily weights. - Cardiology consult be obtained. - I notified [facility] about the patient.   COVID-19 virus infection - The patient is status post antiviral course with molnupiravir. - We will continue steroid therapy given her hypoxia though this is more related to her acute CHF. - Mucolytic therapy will be provided. - Vitamin C and zinc sulfate will be given.  Acute respiratory failure with hypoxia (HCC) - This is most likely related to #1 given its occurrence after fluid overload less likely associated with her COVID. - O2 protocol will be followed. - Management otherwise as above.  Hyponatremia - Will obtain hyponatremia workup. - This is likely hypovolemic due to  diminished p.o. intake.  Essential hypertension - We will continue antihypertensive therapy.  Paroxysmal atrial fibrillation (HCC) - Continue amiodarone.  Anxiety - We will continue Xanax.  Hypothyroidism - We will continue Synthroid.      {Tip this will not be part of the note when signed Body mass index is 27.62 kg/m. , ,  (Optional):26781}  {(NOTE) Pain control PDMP Statment (Optional):26782} Consultants: *** Procedures performed: ***  Disposition: {Plan; Disposition:26390} Diet recommendation:  Discharge Diet Orders (From admission, onward)     Start     Ordered   04/24/23 0000  Diet - low sodium heart healthy        04/24/23 1225           {Diet_Plan:26776} DISCHARGE MEDICATION: Allergies as of 04/24/2023       Reactions   Iohexol Shortness Of Breath   Sulfa Antibiotics Anaphylaxis, Swelling   Clonidine Derivatives    Swelling, chest tightness   Floxin [ofloxacin] Other (See Comments)   Hallucinations   Hydralazine Hcl    BP dropped, HR increased, weakness, nausea, syncope   Lotrel [amlodipine Besy-benazepril Hcl] Itching   Potassium-containing Compounds Other (See Comments)   Patient states she is allergic to oral Potassium Chloride only, had chest pains and a slight rash    Protonix [pantoprazole Sodium] Swelling   Toprol Xl [metoprolol Succinate]    fatigue        Medication List     TAKE these medications    ALPRAZolam 0.5 MG tablet Commonly known as: XANAX Take 0.5 mg by mouth at bedtime.   apixaban 5 MG Tabs tablet  Commonly known as: ELIQUIS Take 5 mg by mouth 2 (two) times daily.   calcium carbonate 500 MG chewable tablet Commonly known as: TUMS - dosed in mg elemental calcium Chew 1-2 tablets by mouth 3 (three) times daily as needed for indigestion or heartburn.   dicyclomine 20 MG tablet Commonly known as: BENTYL Take 10 mg by mouth 2 (two) times daily.   furosemide 20 MG tablet Commonly known as: LASIX Take 1 tablet (20  mg total) by mouth daily. Start taking on: April 25, 2023   isosorbide mononitrate 60 MG 24 hr tablet Commonly known as: IMDUR Take 60 mg by mouth 2 (two) times daily.   levothyroxine 125 MCG tablet Commonly known as: SYNTHROID Take 125 mcg by mouth daily before breakfast.   metoprolol succinate 25 MG 24 hr tablet Commonly known as: Toprol XL Take 1 tablet (25 mg total) by mouth daily.   sodium chloride 1 g tablet Take 1 tablet (1 g total) by mouth daily.   spironolactone 25 MG tablet Commonly known as: ALDACTONE Take 25 mg by mouth daily.   valsartan 160 MG tablet Commonly known as: DIOVAN Take 160 mg by mouth 2 (two) times daily.   verapamil 120 MG CR tablet Commonly known as: CALAN-SR Take 1 tablet (120 mg total) by mouth daily.   Vitamin D (Ergocalciferol) 1.25 MG (50000 UNIT) Caps capsule Commonly known as: DRISDOL Take 50,000 Units by mouth every 7 (seven) days.        Discharge Exam: Filed Weights   04/22/23 1623  Weight: 68.5 kg   ***  Condition at discharge: {DC Condition:26389}  The results of significant diagnostics from this hospitalization (including imaging, microbiology, ancillary and laboratory) are listed below for reference.   Imaging Studies: ECHOCARDIOGRAM COMPLETE  Result Date: 04/23/2023    ECHOCARDIOGRAM REPORT   Patient Name:   Kristi Barrera Date of Exam: 04/23/2023 Medical Rec #:  [id number]              Height:       62.0 in Accession #:    [id number]             Weight:       151.0 lb Date of Birth:  10-23-1940             BSA:          1.696 m Patient Age:    81 years               BP:           186/93 mmHg Patient Gender: F                      HR:           65 bpm. Exam Location:  [facility] Procedure: 2D Echo, Cardiac Doppler and Color Doppler Indications:     Congestive Heart Failure I50.9  History:         Patient has prior history of Echocardiogram examinations.                  Signs/Symptoms:Chest Pain; Risk  Factors:Hypertension.  Sonographer:     Neysa Bonito Roar Referring Phys:  [id number] SHERI HAMMOCK Diagnosing Phys: Debbe Odea MD IMPRESSIONS  1. Left ventricular ejection fraction, by estimation, is 55 to 60%. The left ventricle has normal function. The left ventricle has no regional wall motion abnormalities. There is mild left ventricular hypertrophy. Left ventricular diastolic parameters are consistent with Grade  Commonly known as: ELIQUIS Take 5 mg by mouth 2 (two) times daily.   calcium carbonate 500 MG chewable tablet Commonly known as: TUMS - dosed in mg elemental calcium Chew 1-2 tablets by mouth 3 (three) times daily as needed for indigestion or heartburn.   dicyclomine 20 MG tablet Commonly known as: BENTYL Take 10 mg by mouth 2 (two) times daily.   furosemide 20 MG tablet Commonly known as: LASIX Take 1 tablet (20  mg total) by mouth daily. Start taking on: April 25, 2023   isosorbide mononitrate 60 MG 24 hr tablet Commonly known as: IMDUR Take 60 mg by mouth 2 (two) times daily.   levothyroxine 125 MCG tablet Commonly known as: SYNTHROID Take 125 mcg by mouth daily before breakfast.   metoprolol succinate 25 MG 24 hr tablet Commonly known as: Toprol XL Take 1 tablet (25 mg total) by mouth daily.   sodium chloride 1 g tablet Take 1 tablet (1 g total) by mouth daily.   spironolactone 25 MG tablet Commonly known as: ALDACTONE Take 25 mg by mouth daily.   valsartan 160 MG tablet Commonly known as: DIOVAN Take 160 mg by mouth 2 (two) times daily.   verapamil 120 MG CR tablet Commonly known as: CALAN-SR Take 1 tablet (120 mg total) by mouth daily.   Vitamin D (Ergocalciferol) 1.25 MG (50000 UNIT) Caps capsule Commonly known as: DRISDOL Take 50,000 Units by mouth every 7 (seven) days.        Discharge Exam: Filed Weights   04/22/23 1623  Weight: 68.5 kg   ***  Condition at discharge: {DC Condition:26389}  The results of significant diagnostics from this hospitalization (including imaging, microbiology, ancillary and laboratory) are listed below for reference.   Imaging Studies: ECHOCARDIOGRAM COMPLETE  Result Date: 04/23/2023    ECHOCARDIOGRAM REPORT   Patient Name:   Kristi Barrera Date of Exam: 04/23/2023 Medical Rec #:  [id number]              Height:       62.0 in Accession #:    [id number]             Weight:       151.0 lb Date of Birth:  10-23-1940             BSA:          1.696 m Patient Age:    81 years               BP:           186/93 mmHg Patient Gender: F                      HR:           65 bpm. Exam Location:  [facility] Procedure: 2D Echo, Cardiac Doppler and Color Doppler Indications:     Congestive Heart Failure I50.9  History:         Patient has prior history of Echocardiogram examinations.                  Signs/Symptoms:Chest Pain; Risk  Factors:Hypertension.  Sonographer:     Neysa Bonito Roar Referring Phys:  [id number] SHERI HAMMOCK Diagnosing Phys: Debbe Odea MD IMPRESSIONS  1. Left ventricular ejection fraction, by estimation, is 55 to 60%. The left ventricle has normal function. The left ventricle has no regional wall motion abnormalities. There is mild left ventricular hypertrophy. Left ventricular diastolic parameters are consistent with Grade  Physician Discharge Summary   Patient: Kristi Barrera MRN: [id number] DOB: [birth year]  Admit date:     04/22/2023  Discharge date: 04/24/23  Discharge Physician: Marcelino Duster   PCP: Marguarite Arbour, MD   Recommendations at discharge:  {Tip this will not be part of the note when signed- Example include specific recommendations for outpatient follow-up, pending tests to follow-up on. (Optional):26781}  PCP follow up in 1 week Follow BMP to trend sodium, she is advised to limit fluids to 1.5L, salt tab. Cardiology follow up as suggested.  Discharge Diagnoses: Principal Problem:   Acute on chronic diastolic CHF (congestive heart failure) (HCC) Active Problems:   COVID-19 virus infection   Acute respiratory failure with hypoxia (HCC)   Hyponatremia   Hypothyroidism   Anxiety   Paroxysmal atrial fibrillation (HCC)   Essential hypertension  Resolved Problems:   * No resolved hospital problems. [facility] Course: No notes on file  Assessment and Plan: * Acute on chronic diastolic CHF (congestive heart failure) (HCC)  -The patient will be admitted to a progressive unit bed. - We will continue diuresis with IV Lasix. - We Will follow serial troponins. - We will follow I's and O's and daily weights. - Cardiology consult be obtained. - I notified [facility] about the patient.   COVID-19 virus infection - The patient is status post antiviral course with molnupiravir. - We will continue steroid therapy given her hypoxia though this is more related to her acute CHF. - Mucolytic therapy will be provided. - Vitamin C and zinc sulfate will be given.  Acute respiratory failure with hypoxia (HCC) - This is most likely related to #1 given its occurrence after fluid overload less likely associated with her COVID. - O2 protocol will be followed. - Management otherwise as above.  Hyponatremia - Will obtain hyponatremia workup. - This is likely hypovolemic due to  diminished p.o. intake.  Essential hypertension - We will continue antihypertensive therapy.  Paroxysmal atrial fibrillation (HCC) - Continue amiodarone.  Anxiety - We will continue Xanax.  Hypothyroidism - We will continue Synthroid.      {Tip this will not be part of the note when signed Body mass index is 27.62 kg/m. , ,  (Optional):26781}  {(NOTE) Pain control PDMP Statment (Optional):26782} Consultants: *** Procedures performed: ***  Disposition: {Plan; Disposition:26390} Diet recommendation:  Discharge Diet Orders (From admission, onward)     Start     Ordered   04/24/23 0000  Diet - low sodium heart healthy        04/24/23 1225           {Diet_Plan:26776} DISCHARGE MEDICATION: Allergies as of 04/24/2023       Reactions   Iohexol Shortness Of Breath   Sulfa Antibiotics Anaphylaxis, Swelling   Clonidine Derivatives    Swelling, chest tightness   Floxin [ofloxacin] Other (See Comments)   Hallucinations   Hydralazine Hcl    BP dropped, HR increased, weakness, nausea, syncope   Lotrel [amlodipine Besy-benazepril Hcl] Itching   Potassium-containing Compounds Other (See Comments)   Patient states she is allergic to oral Potassium Chloride only, had chest pains and a slight rash    Protonix [pantoprazole Sodium] Swelling   Toprol Xl [metoprolol Succinate]    fatigue        Medication List     TAKE these medications    ALPRAZolam 0.5 MG tablet Commonly known as: XANAX Take 0.5 mg by mouth at bedtime.   apixaban 5 MG Tabs tablet  Physician Discharge Summary   Patient: Kristi Barrera MRN: [id number] DOB: [birth year]  Admit date:     04/22/2023  Discharge date: 04/24/23  Discharge Physician: Marcelino Duster   PCP: Marguarite Arbour, MD   Recommendations at discharge:  {Tip this will not be part of the note when signed- Example include specific recommendations for outpatient follow-up, pending tests to follow-up on. (Optional):26781}  PCP follow up in 1 week Follow BMP to trend sodium, she is advised to limit fluids to 1.5L, salt tab. Cardiology follow up as suggested.  Discharge Diagnoses: Principal Problem:   Acute on chronic diastolic CHF (congestive heart failure) (HCC) Active Problems:   COVID-19 virus infection   Acute respiratory failure with hypoxia (HCC)   Hyponatremia   Hypothyroidism   Anxiety   Paroxysmal atrial fibrillation (HCC)   Essential hypertension  Resolved Problems:   * No resolved hospital problems. [facility] Course: No notes on file  Assessment and Plan: * Acute on chronic diastolic CHF (congestive heart failure) (HCC)  -The patient will be admitted to a progressive unit bed. - We will continue diuresis with IV Lasix. - We Will follow serial troponins. - We will follow I's and O's and daily weights. - Cardiology consult be obtained. - I notified [facility] about the patient.   COVID-19 virus infection - The patient is status post antiviral course with molnupiravir. - We will continue steroid therapy given her hypoxia though this is more related to her acute CHF. - Mucolytic therapy will be provided. - Vitamin C and zinc sulfate will be given.  Acute respiratory failure with hypoxia (HCC) - This is most likely related to #1 given its occurrence after fluid overload less likely associated with her COVID. - O2 protocol will be followed. - Management otherwise as above.  Hyponatremia - Will obtain hyponatremia workup. - This is likely hypovolemic due to  diminished p.o. intake.  Essential hypertension - We will continue antihypertensive therapy.  Paroxysmal atrial fibrillation (HCC) - Continue amiodarone.  Anxiety - We will continue Xanax.  Hypothyroidism - We will continue Synthroid.      {Tip this will not be part of the note when signed Body mass index is 27.62 kg/m. , ,  (Optional):26781}  {(NOTE) Pain control PDMP Statment (Optional):26782} Consultants: *** Procedures performed: ***  Disposition: {Plan; Disposition:26390} Diet recommendation:  Discharge Diet Orders (From admission, onward)     Start     Ordered   04/24/23 0000  Diet - low sodium heart healthy        04/24/23 1225           {Diet_Plan:26776} DISCHARGE MEDICATION: Allergies as of 04/24/2023       Reactions   Iohexol Shortness Of Breath   Sulfa Antibiotics Anaphylaxis, Swelling   Clonidine Derivatives    Swelling, chest tightness   Floxin [ofloxacin] Other (See Comments)   Hallucinations   Hydralazine Hcl    BP dropped, HR increased, weakness, nausea, syncope   Lotrel [amlodipine Besy-benazepril Hcl] Itching   Potassium-containing Compounds Other (See Comments)   Patient states she is allergic to oral Potassium Chloride only, had chest pains and a slight rash    Protonix [pantoprazole Sodium] Swelling   Toprol Xl [metoprolol Succinate]    fatigue        Medication List     TAKE these medications    ALPRAZolam 0.5 MG tablet Commonly known as: XANAX Take 0.5 mg by mouth at bedtime.   apixaban 5 MG Tabs tablet

## 2023-04-24 NOTE — Progress Notes (Signed)
PT Cancellation Note  Patient Details Name: Kristi Barrera MRN: [id number] DOB: 02-09-1941   Cancelled Treatment:    Reason Eval/Treat Not Completed: Other (comment). Per chart review pt with discharge orders, pt/family aware. Currently declining PT services, pt/family comfortable with current mobility and going home (endorsed still feeling different than baseline). PT discussed obtaining a RW as needed as well as PCP ability to place HHPT orders if needed in the next couple of weeks if pt continues to be fatigued/weak compared to baseline. PT to sign off.   Olga Coaster PT, DPT 12:59 PM,04/24/23

## 2023-04-24 NOTE — Plan of Care (Signed)

## 2023-04-24 NOTE — Progress Notes (Signed)
OT Cancellation Note  Patient Details Name: Kristi Barrera MRN: [id number] DOB: Jul 04, 1941   Cancelled Treatment:    Reason Eval/Treat Not Completed: OT screened, no needs identified, will sign off. Per chart review, pt with d/c orders and has declined [facility] services. Discussed with PT - pt is comfortable with current mobility levels. No acute OT needs identified.   Kristi Barrera, OTR/L  04/24/23, 2:16 PM

## 2023-04-24 NOTE — TOC Transition Note (Signed)
Transition of Care [facility]) - CM/SW Discharge Note   Patient Details  Name: Kristi Barrera MRN: [id number] Date of Birth: [birth year]  Transition of Care [facility]) CM/SW Contact:  Bing Quarry, RN Phone Number: 04/24/2023, 1:14 PM   Clinical Narrative: 04/24/23: Discharge orders are in for today Declined [facility] services. Per PT: "Discussed obtaining a RW as needed as well as PCP ability to place HHPT orders if needed in the next couple of weeks if pt continues to be fatigued/weak compared to baseline."  To follow up with PCP in ONE WEEK with Dr Osborne Oman. PHONE: [phone]. NO SDOH alerts on review. Inpatient TOC will sign off but please contact if need arises prior to discharge.   Gabriel Cirri MSN RN CM  Transitions of Care Department [facility] [phone] Weekends Only     Final next level of care: Home/Self Care Barriers to Discharge: No Barriers Identified   Patient Goals and CMS Choice      Discharge Placement                         Discharge Plan and Services Additional resources added to the After Visit Summary for                  DME Arranged: N/A DME Agency: NA       HH Arranged: Patient Refused HH HH Agency: NA        Social Determinants of Health (SDOH) Interventions SDOH Screenings   Food Insecurity: No Food Insecurity (04/22/2023)   Received from [facility]  Transportation Needs: No Transportation Needs (04/22/2023)   Received from [facility]  Utilities: Not At Risk (04/22/2023)   Received from [facility]  Financial Resource Strain: Low Risk  (04/22/2023)   Received from [facility]  Tobacco Use: Low Risk  (04/22/2023)   Received from [facility]     Readmission Risk Interventions     No data to display

## 2023-05-18 ENCOUNTER — Ambulatory Visit
Admission: RE | Admit: 2023-05-18 | Discharge: 2023-05-18 | Disposition: A | Discharge status: Home / Self Care | Payer: Medicare Other | Source: Ambulatory Visit | Attending: Internal Medicine | Admitting: Internal Medicine

## 2023-05-18 DIAGNOSIS — Z1231 Encounter for screening mammogram for malignant neoplasm of breast: Secondary | ICD-10-CM | POA: Diagnosis present

## 2023-06-03 ENCOUNTER — Telehealth: Payer: Self-pay | Admitting: Cardiovascular Disease

## 2023-06-03 MED ORDER — ISOSORBIDE MONONITRATE ER 60 MG PO TB24: 60.0000 mg | ORAL_TABLET | Freq: Two times a day (BID) | ORAL | 2 refills | Status: DC

## 2023-06-03 NOTE — Telephone Encounter (Signed)
*  STAT* If patient is at the pharmacy, call can be transferred to refill team.   1. Which medications need to be refilled? (please list name of each medication and dose if known)   isosorbide mononitrate (IMDUR) 60 MG 24 hr tablet    2. Which pharmacy/location (including street and city if local pharmacy) is medication to be sent to? [facility] [place], [place] - [address] Phone: [phone]  Fax: [phone]     3. Do they need a 30 day or 90 day supply? 90

## 2023-06-03 NOTE — Telephone Encounter (Signed)
Please advise if ok to refill historical medication. ? ?

## 2023-06-03 NOTE — Telephone Encounter (Signed)
Requested Prescriptions   Signed Prescriptions Disp Refills   isosorbide mononitrate (IMDUR) 60 MG 24 hr tablet 180 tablet 2    Sig: Take 1 tablet (60 mg total) by mouth 2 (two) times daily.    Authorizing Provider: Antonieta Iba    Ordering User: Kendrick Fries

## 2023-06-12 NOTE — Progress Notes (Unsigned)
Cardiology Clinic Note   Date: 06/14/2023 ID: Kristi Barrera, DOB [birth year], MRN [id number]  Primary Cardiologist:  Julien Nordmann, MD  Patient Profile    Kristi Barrera is a 82 y.o. female who presents to the clinic today for hospital follow up.     Past medical history significant for: PAF. Chronic diastolic heart failure. Echo 04/23/2023: EF 55 to 60%.  No RWMA.  Mild LVH.  Grade I DD.  Normal RV size/function.  Mild MR.  Mild to moderate AI. Pulmonary artery hypertension. Hypertension. Renal artery duplex 07/31/2021: Isolated elevated peak systolic velocity within the mid aspect of the right renal arteries without associated abnormal right renal artery/aortic ratio or associated asymmetric renal atrophy, favored to be technique related though could be seen in the setting of early/mild renal artery stenosis. Hyperlipidemia. CT cardiac scoring 01/14/2023: Coronary calcium score 113 (53rd percentile).  LAD 83.9, LCx 29.4. Lipid panel 04/01/2023: LDL 120, HDL 45, TG 70, total 179. OSA. Sarcoidosis. Hypothyroidism. GI bleed.  In summary, patient was previously followed by [facility] clinic cardiology.  Prior echo March 2022 revealed normal LV/RV size and function, EF 50%, severe MR, moderate TR.  Repeat echo January 2023 demonstrated normal LV function and mild MR.  She had an episode of A-fib with RVR in April 2023 treated in the emergency department with medications converting to NSR.  Hospital admission 01/10/2023 for A-fib.  Verapamil was increased and as needed metoprolol was added.  She converted back to NSR in the emergency department.  Patient was first evaluated by Dr. Mariah Milling on 02/19/2023 for PAF at the request of PCP.  She reported adherence to Eliquis 5 mg twice daily.  She deferred amiodarone as previously offered by [facility] clinic.  It was recommended she start Toprol titrating to 25 mg daily and continue verapamil daily.  Use of flecainide for breakthrough  arrhythmia was considered.  CT calcium scoring demonstrated a calcium score of 113 (LAD and LCx).  Patient had declined statin therapy.  Patient presented to [facility] ED on 04/22/2023 with sudden onset of shortness of breath.  SpO2 88% on room air.  She improved to 94% on 3 L of O2 via [place].  Fluids were stopped when shortness of breath worsened.  Her husband had recently been diagnosed with COVID and a couple of days later she lost taste and experience nausea and occasional diarrhea.  She was started on antivirals by PCP that were finished on the day of her arrival to the ED.  Initial vitals demonstrated BP 186/93, HR 58 bpm, respirations 19, afebrile.  COVID PCR was positive sodium 122, creatinine 1.03.  Chest x-ray showed interval increase central vascular prominence and interstitial consolidation consistent with CHF or fluid overload with small pericardial effusions beginning to form, background chronic interstitial changes.  She was diuresed with IV Lasix.  Echo showed normal LV/RV function, mild LVH, Grade I DD, mild MR, mild to moderate AI.     History of Present Illness    Kristi Barrera is followed by Dr. Mariah Milling for the above outlined history.   Discussed the use of AI scribe software for clinical note transcription with the patient, who gave verbal consent to proceed.  The patient presents alone for hospital follow-up. She is doing well. Patient denies shortness of breath, dyspnea on exertion, lower extremity edema, orthopnea or PND. No chest pain, pressure, or tightness. She had one episode of a "funny feeling" in her epigastrium that resolved with Tums. No other episodes. No exertional  chest discomfort. No palpitations.   She reports noticing first thing some mornings her heart rate is in the 40s and she feels very fatigued. This typically lasts a couple of hours and improves with eating breakfast and starting to move around. She takes verapamil in the evening and Toprol in the afternoon.   She walks for exercise when the temperature is warm but struggles to get activity in when it is cold so her husband just recently bought her an elliptical which she will begin using in the cooler months.   She brings in a form from a recent insurance RN visit that mentions "no rate control on current regimen" and high BP ratings. Patient reports her BP was likely increased because she was annoyed with the RN. BP is well controlled today. Home BP typically in the 130-140s/70s. HR 63 today. Patient reports other than episodes of HR in the 40s as above it is typically in the 60s.         ROS: All other systems reviewed and are otherwise negative except as noted in History of Present Illness.  Studies Reviewed    EKG Interpretation Date/Time:  Monday June 14 2023 10:42:37 EST Ventricular Rate:  63 PR Interval:  178 QRS Duration:  142 QT Interval:  446 QTC Calculation: 456 R Axis:   15  Text Interpretation: Normal sinus rhythm Left bundle branch block When compared with ECG of 22-Apr-2023 20:55, Premature supraventricular complexes are no longer Present Confirmed by Carlos Levering [phone]) on 06/14/2023 10:49:44 AM   Risk Assessment/Calculations     CHA2DS2-VASc Score = 5   This indicates a 7.2% annual risk of stroke. The patient's score is based upon: CHF History: 1 HTN History: 1 Diabetes History: 0 Stroke History: 0 Vascular Disease History: 0 Age Score: 2 Gender Score: 1             Physical Exam    VS:  BP 130/72 (BP Location: Left Arm, Patient Position: Sitting, Cuff Size: Normal)   Pulse 63   Ht 5' 1.5" (1.562 m)   Wt 148 lb 3.2 oz (67.2 kg)   SpO2 99%   BMI 27.55 kg/m  , BMI Body mass index is 27.55 kg/m.  GEN: Well nourished, well developed, in no acute distress. Neck: No JVD or carotid bruits. Cardiac:  RRR. No murmurs. No rubs or gallops.   Respiratory:  Respirations regular and unlabored. Clear to auscultation without rales, wheezing or  rhonchi. GI: Soft, nontender, nondistended. Extremities: Radials/DP/PT 2+ and equal bilaterally. No clubbing or cyanosis. No edema.  Skin: Warm and dry, no rash. Neuro: Strength intact.  Assessment & Plan     PAF Patient denies palpitations. She was very pleased that she did not go into afib when she had Covid.  Denies spontaneous bleeding concerns. RRR today. Sinus rhythm with LBBB on EKG today.  -Continue Toprol, verapamil, Eliquis. Appropriate Eliquis dose.   -She will try taking verapamil in the morning instead of the evening to avoid bradycardia first thing in the morning.   Chronic diastolic heart failure Echo October 2024 showed normal LV/RV function, mild LVH, Grade I DD, mild MR, mild to moderate AI.  Patient denies lower extremity edema, DOE, orthopnea or PND.  Euvolemic and well compensated on exam. -Continue Toprol, valsartan, spironolactone, isosorbide, Lasix.  Hypertension BP today 130/72. No dizziness or headaches reported.  -Continue valsartan, spironolactone, verapamil, Toprol, isosorbide.  Hyperlipidemia/Statin intolerance.  LDL September 2024 120.  Patient continues to decline statin. -Continue to follow  with PCP.         Disposition: Return for previously scheduled visit with Dr. Mariah Milling on 08/20/2023 or sooner as needed.          Signed, Etta Grandchild. Shayanna Thatch, DNP, NP-C

## 2023-06-14 ENCOUNTER — Ambulatory Visit: Payer: Medicare Other | Attending: Student | Admitting: Student

## 2023-06-14 ENCOUNTER — Encounter: Payer: Self-pay | Admitting: Student

## 2023-06-14 VITALS — BP 130/72 | HR 63 | Ht 61.5 in | Wt 148.2 lb

## 2023-06-14 DIAGNOSIS — I5032 Chronic diastolic (congestive) heart failure: Secondary | ICD-10-CM | POA: Diagnosis not present

## 2023-06-14 DIAGNOSIS — E782 Mixed hyperlipidemia: Secondary | ICD-10-CM

## 2023-06-14 DIAGNOSIS — Z789 Other specified health status: Secondary | ICD-10-CM

## 2023-06-14 DIAGNOSIS — I10 Essential (primary) hypertension: Secondary | ICD-10-CM

## 2023-06-14 DIAGNOSIS — I480 Paroxysmal atrial fibrillation: Secondary | ICD-10-CM | POA: Diagnosis not present

## 2023-06-14 NOTE — Patient Instructions (Signed)
Medication Instructions:  No changes *If you need a refill on your cardiac medications before your next appointment, please call your pharmacy*   Lab Work: None ordered If you have labs (blood work) drawn today and your tests are completely normal, you will receive your results only by: MyChart Message (if you have MyChart) OR A paper copy in the mail If you have any lab test that is abnormal or we need to change your treatment, we will call you to review the results.   Testing/Procedures: None ordered   Follow-Up: At [facility], you and your health needs are our priority.  As part of our continuing mission to provide you with exceptional heart care, we have created designated Provider Care Teams.  These Care Teams include your primary Cardiologist (physician) and Advanced Practice Providers (APPs -  Physician Assistants and Nurse Practitioners) who all work together to provide you with the care you need, when you need it.  We recommend signing up for the patient portal called "MyChart".  Sign up information is provided on this After Visit Summary.  MyChart is used to connect with patients for Virtual Visits (Telemedicine).  Patients are able to view lab/test results, encounter notes, upcoming appointments, etc.  Non-urgent messages can be sent to your provider as well.   To learn more about what you can do with MyChart, go to [email or web address].    Your next appointment:   Keep follow up as scheduled

## 2023-08-17 NOTE — Progress Notes (Addendum)
Cardiology Office Note  Date:  08/20/2023   ID:  Kristi Barrera, DOB 08-11-1940, MRN [id number]  PCP:  Kristi Arbour, MD   Chief Complaint  Patient presents with   6 month follow up     Patient c/o irregular heart beats, nausea, shortness of breath & weakness about 1 hour after taking Verapamil; symptoms last 2-4 hours after taking the Verapamil.     HPI:  [name] is a 83 year old woman with past medical history of Atrial fibrillation, paroxysmal Hypertension Hyperlipidemia [place] on CPAP Calcium scoring 113 Who presents for follow-up of her paroxysmal atrial fibrillation  Last seen by myself in clinic August 2024  Covid 10/24 Hospitalized with respiratory distress D/c with lasix, was stopped as outpt  On follow-up reports no significant atrial fibrillation episodes  Feels she is having side effects on verapamil Previously on verapamil 180, dose was reduced down to 120 Continues to have episodes of weakness, feels nervous after taking verapamil ER 120 daily lasting for several hours  Tolerating metoprolol succinate 25 daily, valsartan 160 twice daily, spironolactone 25 daily, Imdur 60 twice daily Blood pressure well-controlled  Active, goes shopping, no regular exercise program Compliant with her CPAP  Lab work reviewed totaL CHOL 179, LDL 120  EKG personally reviewed by myself on todays visit EKG Interpretation Date/Time:  Friday August 20 2023 10:29:24 EST Ventricular Rate:  56 PR Interval:  184 QRS Duration:  146 QT Interval:  450 QTC Calculation: 434 R Axis:   23  Text Interpretation: Sinus bradycardia Left bundle branch block When compared with ECG of 14-Jun-2023 10:42, No significant change was found Confirmed by Julien Nordmann [phone]) on 08/20/2023 10:48:14 AM   Atrial fibrillation April, 2023   converted to NSR in the ER  Echocardiogram March 2022,  Normal left and right ventricular size and function  EF 50% severe MR  Moderate TR  Repeat echocardiogram January 2023,  Normal LV function Mild MR  Previously declined amiodarone   PMH:   has a past medical history of Abnormal cardiac valve, Anxiety, Arthritis, Chest pain, Chronic low back pain, Colitis, chronic, ulcerative (HCC), Complication of anesthesia, Dysrhythmia, Family history of adverse reaction to anesthesia, GERD (gastroesophageal reflux disease), HTN (hypertension), Hyperlipidemia, Hypothyroidism, LBBB (left bundle branch block), Low blood potassium, PAF (paroxysmal atrial fibrillation) (HCC), Palpitations, Recurrent UTI, Sarcoidosis, Sinusitis, Skin cancer, Sleep apnea, Thyroid disease, Vitamin D deficiency, and Wears dentures.  PSH:    Past Surgical History:  Procedure Laterality Date   BREAST CYST ASPIRATION Left    neg   BREAST CYST ASPIRATION Right 09/25/2019   CATARACT EXTRACTION W/PHACO Right 04/06/2017   Procedure: CATARACT EXTRACTION PHACO AND INTRAOCULAR LENS PLACEMENT (IOC);  Surgeon: Galen Manila, MD;  Location: [facility] ORS;  Service: Ophthalmology;  Laterality: Right;  [place] 00:36.1 AP% 18.5 CDE 6.66 fluid Pack lot # [id number]   CATARACT EXTRACTION W/PHACO Left 04/13/2022   Procedure: CATARACT EXTRACTION PHACO AND INTRAOCULAR LENS PLACEMENT (IOC) LEFT 8.82 00:40.8;  Surgeon: Nevada Crane, MD;  Location: [facility];  Service: Ophthalmology;  Laterality: Left;  sleep apnea   COLONOSCOPY WITH PROPOFOL N/A 08/06/2015   Procedure: COLONOSCOPY WITH PROPOFOL;  Surgeon: Scot Jun, MD;  Location: [facility] ENDOSCOPY;  Service: Endoscopy;  Laterality: N/A;   COLONOSCOPY WITH PROPOFOL N/A 01/15/2016   Procedure: COLONOSCOPY WITH PROPOFOL;  Surgeon: Scot Jun, MD;  Location: [facility] END[facility]COPY;  Service: Endoscopy;  Laterality: N/A;   Urinary procedure      Current Outpatient Medications  Medication Sig Dispense Refill   ALPRAZolam (XANAX) 0.5 MG tablet Take 0.5 mg by mouth at bedtime.     apixaban (ELIQUIS) 5 MG TABS  tablet Take 5 mg by mouth 2 (two) times daily.     calcium carbonate (TUMS - DOSED IN MG ELEMENTAL CALCIUM) 500 MG chewable tablet Chew 1-2 tablets by mouth 3 (three) times daily as needed for indigestion or heartburn.     dicyclomine (BENTYL) 20 MG tablet Take 10 mg by mouth 2 (two) times daily.     isosorbide mononitrate (IMDUR) 60 MG 24 hr tablet Take 1 tablet (60 mg total) by mouth 2 (two) times daily. 180 tablet 2   levothyroxine (SYNTHROID) 75 MCG tablet Take by mouth.     metoprolol succinate (TOPROL XL) 25 MG 24 hr tablet Take 1 tablet (25 mg total) by mouth daily. 90 tablet 3   spironolactone (ALDACTONE) 25 MG tablet Take 25 mg by mouth daily.     valsartan (DIOVAN) 160 MG tablet Take 160 mg by mouth 2 (two) times daily.     verapamil (CALAN-SR) 120 MG CR tablet Take 1 tablet (120 mg total) by mouth daily. 90 tablet 0   Vitamin D, Ergocalciferol, (DRISDOL) 1.25 MG (50000 UNIT) CAPS capsule Take 50,000 Units by mouth every 7 (seven) days.     No current facility-administered medications for this visit.    Allergies:   Iohexol, Sulfa antibiotics, Clonidine derivatives, Floxin [ofloxacin], Hydralazine hcl, Lotrel [amlodipine besy-benazepril hcl], Potassium-containing compounds, Protonix [pantoprazole sodium], and Toprol xl [metoprolol succinate]   Social History:  The patient  reports that she has never smoked. She has never used smokeless tobacco. She reports that she does not drink alcohol and does not use drugs.   Family History:   family history includes Heart disease in her father; Hypertension in her father; Pneumonia in her father; Rheum arthritis in her mother; Stroke in her father and mother.    Review of Systems: Review of Systems  Constitutional: Negative.   HENT: Negative.    Respiratory: Negative.    Cardiovascular: Negative.   Gastrointestinal: Negative.   Musculoskeletal: Negative.   Neurological: Negative.   Psychiatric/Behavioral: Negative.    All other systems  reviewed and are negative.   PHYSICAL EXAM: VS:  BP 138/70 (BP Location: Left Arm, Patient Position: Sitting, Cuff Size: Normal)   Pulse 86   Ht 5' 1.5" (1.562 m)   Wt 150 lb 2 oz (68.1 kg)   SpO2 95%   BMI 27.91 kg/m  , BMI Body mass index is 27.91 kg/m. Constitutional:  oriented to person, place, and time. No distress.  HENT:  Head: Grossly normal Eyes:  no discharge. No scleral icterus.  Neck: No JVD, no carotid bruits  Cardiovascular: Regular rate and rhythm, no murmurs appreciated Pulmonary/Chest: Clear to auscultation bilaterally, no wheezes or rails Abdominal: Soft.  no distension.  no tenderness.  Musculoskeletal: Normal range of motion Neurological:  normal muscle tone. Coordination normal. No atrophy Skin: Skin warm and dry Psychiatric: normal affect, pleasant  Recent Labs: 10/29/2022: Magnesium 1.7; TSH 1.462 04/22/2023: ALT 16 04/23/2023: B Natriuretic Peptide 412.5 04/24/2023: BUN 25; Creatinine, Ser 1.18; Hemoglobin 12.4; Platelets 279; Potassium 3.9; Sodium 123    Lipid Panel No results found for: "CHOL", "HDL", "LDLCALC", "TRIG"    Wt Readings from Last 3 Encounters:  08/20/23 150 lb 2 oz (68.1 kg)  06/14/23 148 lb 3.2 oz (67.2 kg)  04/22/23 151 lb (68.5 kg)     ASSESSMENT AND PLAN:  Problem List Items Addressed This Visit       Cardiology Problems   Hyperlipidemia   HTN (hypertension)   Relevant Orders   EKG 12-Lead (Completed)   PAH (pulmonary artery hypertension) (HCC)   Relevant Orders   EKG 12-Lead (Completed)   Other Visit Diagnoses       PAF (paroxysmal atrial fibrillation) (HCC)    -  Primary   Relevant Orders   EKG 12-Lead (Completed)     Chronic diastolic heart failure (HCC)       Relevant Orders   EKG 12-Lead (Completed)     Statin intolerance         Coronary artery calcification       Relevant Orders   EKG 12-Lead (Completed)       Paroxysmal atrial fibrillation Reports no recent episodes Feels she is having side  effects from verapamil ER 120 daily, feels weak, nervous after she takes the pill Recommend she cut the pill in half as it is not a capsule Will increase metoprolol succinate up to 25 twice daily If she continues to have symptoms, could further increase the metoprolol succinate and wean off the verapamil Continue Eliquis 5 twice daily  Essential hypertension Changes as above, for the most part blood pressure relatively well-controlled  Coronary calcification Score 113, prefers not to be on a statin Denies anginal symptoms  Hyperlipidemia Prefers not to be on a statin Consider Zetia  Gait instability Recommend exercise program   Signed, Dossie Barrera, M.D., Ph.D. [facility] [place], [place] [phone]

## 2023-08-19 ENCOUNTER — Telehealth: Payer: Self-pay | Admitting: Cardiovascular Disease

## 2023-08-19 ENCOUNTER — Other Ambulatory Visit: Payer: Self-pay

## 2023-08-19 NOTE — Telephone Encounter (Signed)
Med Rec complete, allergies and Pharmacy verified August 19 2023.

## 2023-08-20 ENCOUNTER — Ambulatory Visit: Payer: Medicare Other | Attending: Cardiovascular Disease | Admitting: Cardiovascular Disease

## 2023-08-20 VITALS — BP 138/70 | HR 86 | Ht 61.5 in | Wt 150.1 lb

## 2023-08-20 DIAGNOSIS — I480 Paroxysmal atrial fibrillation: Secondary | ICD-10-CM | POA: Diagnosis not present

## 2023-08-20 DIAGNOSIS — I10 Essential (primary) hypertension: Secondary | ICD-10-CM

## 2023-08-20 DIAGNOSIS — I2510 Atherosclerotic heart disease of native coronary artery without angina pectoris: Secondary | ICD-10-CM

## 2023-08-20 DIAGNOSIS — I2721 Secondary pulmonary arterial hypertension: Secondary | ICD-10-CM

## 2023-08-20 DIAGNOSIS — Z789 Other specified health status: Secondary | ICD-10-CM

## 2023-08-20 DIAGNOSIS — E782 Mixed hyperlipidemia: Secondary | ICD-10-CM

## 2023-08-20 DIAGNOSIS — I5032 Chronic diastolic (congestive) heart failure: Secondary | ICD-10-CM | POA: Diagnosis not present

## 2023-08-20 MED ORDER — METOPROLOL SUCCINATE ER 25 MG PO TB24: 25.0000 mg | ORAL_TABLET | Freq: Two times a day (BID) | ORAL | 3 refills | Status: DC

## 2023-08-20 NOTE — Patient Instructions (Signed)
Medication Instructions:  Please cut the verapamil ER 120 in 1/2 daily Please increase the metoprolol succinate up to 25 mg twice a day  If you need a refill on your cardiac medications before your next appointment, please call your pharmacy.   Lab work: No new labs needed  Testing/Procedures: No new testing needed  Follow-Up: At [facility], you and your health needs are our priority.  As part of our continuing mission to provide you with exceptional heart care, we have created designated Provider Care Teams.  These Care Teams include your primary Cardiologist (physician) and Advanced Practice Providers (APPs -  Physician Assistants and Nurse Practitioners) who all work together to provide you with the care you need, when you need it.  You will need a follow up appointment in 12 months  Providers on your designated Care Team:   Nicolasa Ducking, NP Eula Listen, PA-C Cadence Fransico Michael, [place]  COVID-19 Vaccine Information can be found at: [email or web address] For questions related to vaccine distribution or appointments, please email [email or web address] or call [phone].

## 2023-08-26 ENCOUNTER — Telehealth: Payer: Self-pay | Admitting: Cardiovascular Disease

## 2023-08-26 MED ORDER — ISOSORBIDE MONONITRATE ER 60 MG PO TB24: 60.0000 mg | ORAL_TABLET | Freq: Two times a day (BID) | ORAL | 3 refills | Status: DC

## 2023-08-26 NOTE — Telephone Encounter (Signed)
*  STAT* If patient is at the pharmacy, call can be transferred to refill team.   1. Which medications need to be refilled? (please list name of each medication and dose if known) isosorbide  mononitrate (IMDUR ) 60 MG 24 hr tablet    2. Would you like to learn more about the convenience, safety, & potential cost savings by using the [facility]?     3. Are you open to using the [facility] (Type [facility]. ).   4. Which pharmacy/location (including street and city if local pharmacy) is medication to be sent to? [facility] [address] [place] - [address]    5. Do they need a 30 day or 90 day supply? 90

## 2023-08-26 NOTE — Telephone Encounter (Signed)
 Requested Prescriptions   Signed Prescriptions Disp Refills   isosorbide  mononitrate (IMDUR ) 60 MG 24 hr tablet 180 tablet 3    Sig: Take 1 tablet (60 mg total) by mouth 2 (two) times daily.    Authorizing Provider: PERLA EVALENE PARAS    Ordering User: [email or web address], Berdine Rasmusson  C

## 2023-09-03 ENCOUNTER — Telehealth: Payer: Self-pay | Admitting: Cardiovascular Disease

## 2023-09-03 NOTE — Telephone Encounter (Signed)
Pt c/o BP issue: STAT if pt c/o blurred vision, one-sided weakness or slurred speech  1. What are your last 5 BP readings?  198/90 HR 59 - taken while on phone  185/92 HR 53 175/84 HR 50  2. Are you having any other symptoms (ex. Dizziness, headache, blurred vision, passed out)? Last night states she became really hot, with headache. No other symptoms currently  3. What is your BP issue? Patient states that her BP medication was changed recently and believes this may be the cause, but would like to discuss this further

## 2023-09-03 NOTE — Telephone Encounter (Signed)
Spoke with patient. Patient clarified that she is currently taking Valsartan 160 Mg twice daily. Patient states that she will follow Dr. Windell Hummingbird recommendations.    For now would recommend she go back on the whole verapamil Follow the prior regimen she was on until blood pressure improves and stabilizes Once it is back to normal, Then would retry half dose verapamil in effort to wean down, When on the half dose verapamil could try 1-1/2 isosorbide in the morning, 1-1/2 in the evening for total of 90 mg twice a day Stay on metoprolol succinate once a day Stay on valsartan 160 twice a day Thx TG

## 2023-09-03 NOTE — Telephone Encounter (Addendum)
The patient called stating that she feels her recent medication changes have caused her blood pressure to increase. She mentioned that she saw Dr. Mariah Milling on 1/31, and he recommended decreasing the verapamil dosage to half a tablet. The patient also reported seeing Dr. Judithann Sheen on 1/31, who recommended stopping verapamil and starting diltiazem. She stated that she started diltiazem the next day, but was only able to take it for three days before she noted feeling weak and nauseated. Last Thursday, she restarted verapamil at a half-tablet daily. She mentioned that she hadn't been keeping a log of her blood pressure, but last night she felt "weird," had a headache, and felt hot. She noted that she did not have a fever, and her blood pressure was 190/89. The reading for today is listed below.  The patient stated that when she took a whole tablet of verapamil, her blood pressure was under control, but it made her feel weak and caused fluttering sensations.  Will forward to MD for recommendations.   2/14-198/90 HR 59 - taken while on phone  2/14-185/92 HR 53 2/14-175/84 HR 50

## 2023-09-03 NOTE — Telephone Encounter (Signed)
Called and spoke with spouse per DPR. Patient was not available. Informed spouse of the following from Dr. Mariah Milling.  Big part of her blood pressure regiment was the valsartan 160 twice a day Without that the numbers will run high TG  Spouse check patient's medications and patient has Valsartan 160 MG tablets. Asked spouse to notify patient that she should be taking the medication and to call back.

## 2023-09-03 NOTE — Telephone Encounter (Signed)
See other telephone encounter.

## 2023-09-03 NOTE — Telephone Encounter (Signed)
Patient returned RN's call.

## 2023-09-03 NOTE — Telephone Encounter (Signed)
Called and spoke with patient. Informed patient of the following recommendations from Dr. Mariah Milling.  For now would recommend she go back on the whole verapamil Follow the prior regimen she was on until blood pressure improves and stabilizes Once it is back to normal, Then would retry half dose verapamil in effort to wean down, When on the half dose verapamil could try 1-1/2 isosorbide in the morning, 1-1/2 in the evening for total of 90 mg twice a day Stay on metoprolol succinate once a day Stay on valsartan 160 twice a day Thx TG   Patient reports that blood pressure was 171/79 and heart rate 67 today. Patient states that she is currently taking 12.5 MG metoprolol at bed time not the 25 MG on her current medication list. Patient also reports that she is not taking Valsartan. Patient states that she will increase the Verapamil to a whole tablet daily. Will forward to MD for further recommendations for weaning Verapamil.

## 2023-09-20 ENCOUNTER — Telehealth: Payer: Self-pay | Admitting: Cardiovascular Disease

## 2023-09-20 ENCOUNTER — Ambulatory Visit: Attending: Cardiovascular Disease | Admitting: Cardiovascular Disease

## 2023-09-20 ENCOUNTER — Encounter: Payer: Self-pay | Admitting: Cardiovascular Disease

## 2023-09-20 VITALS — BP 180/70 | HR 59 | Ht 61.5 in | Wt 152.5 lb

## 2023-09-20 DIAGNOSIS — I480 Paroxysmal atrial fibrillation: Secondary | ICD-10-CM

## 2023-09-20 DIAGNOSIS — I4891 Unspecified atrial fibrillation: Secondary | ICD-10-CM

## 2023-09-20 DIAGNOSIS — I10 Essential (primary) hypertension: Secondary | ICD-10-CM | POA: Diagnosis not present

## 2023-09-20 DIAGNOSIS — I5032 Chronic diastolic (congestive) heart failure: Secondary | ICD-10-CM

## 2023-09-20 MED ORDER — VERAPAMIL HCL ER 120 MG PO TBCR: 60.0000 mg | EXTENDED_RELEASE_TABLET | Freq: Every day | ORAL | 0 refills | Status: DC

## 2023-09-20 MED ORDER — DOXAZOSIN MESYLATE 2 MG PO TABS: 2.0000 mg | ORAL_TABLET | Freq: Two times a day (BID) | ORAL | 1 refills | Status: DC

## 2023-09-20 NOTE — Telephone Encounter (Signed)
 Called patient, she states that she has continued to have issues with her blood pressure. Previous messages from 02/14 states that she was advised to take her Valsartan 160 mg twice daily, Verapamil 120 mg, Metoprolol 25 mg twice daily. Patient states the medications are not working, blood pressure readings this morning 168/97 HR 44, and 171/96 HR 45.   Patient does not feel well, states she is weak, SOB, and just having no energy.   Patient would like to be seen. Dr.Gollan has opening at 4:00 today, scheduled her for that visit.  Patient verbalized understanding, will route to MD/RN to make aware.   Thanks!

## 2023-09-20 NOTE — Progress Notes (Signed)
 Cardiology Office Note  Date:  09/20/2023   ID:  Kristi Barrera, DOB 1941/03/09, MRN [id number]  PCP:  Marguarite Arbour, MD   Chief Complaint  Patient presents with   Hypertension    Patient c/o shortness of breath, decrease in heart rate & elevated blood pressure most days.     HPI:  Kristi Barrera is a 83 year old woman with past medical history of Atrial fibrillation, paroxysmal Hypertension Hyperlipidemia Osa on CPAP Calcium scoring 113 Who presents for follow-up of her paroxysmal atrial fibrillation, HTN  Last seen by myself in clinic jan 2025 On that visit she reported having side effects on verapamil ER 120 daily Metoprolol succinate was increased up to 25 twice daily with instructions to decrease verapamil in half  Since then she reports having hypertension, systolic pressures 160 and higher with BP elevated Isosorbide was increased up to 90 twice daily, verapamil back to 120 daily Pressure continues to run high  Current medications valsartan 160 mg twice daily, Verapamil ER 120 mg, Metoprolol 25 mg daily at bedtime Isosorbide mononitrate 90 BID   Several medication intolerances Spionolactone caused low sodium, renal dysfunction Diltiazem causes sickness /weak Clonidine: chest tightness Hydralazine: weak among other intolerances  Covid 10/24 Hospitalized with respiratory distress D/c with lasix, was stopped as outpt  Lab work reviewed totaL CHOL 179, LDL 120  EKG personally reviewed by myself on todays visit EKG Interpretation Date/Time:  Monday September 20 2023 16:15:11 EST Ventricular Rate:  59 PR Interval:  196 QRS Duration:  150 QT Interval:  470 QTC Calculation: 465 R Axis:   -3  Text Interpretation: Sinus bradycardia Left bundle branch block When compared with ECG of 20-Sep-2023 16:12, No significant change was found Confirmed by Julien Nordmann [phone]) on 09/20/2023 4:29:41 PM    Atrial fibrillation April, 2023   converted to NSR  in the ER  Echocardiogram March 2022,  Normal left and right ventricular size and function  EF 50% severe MR Moderate TR  Repeat echocardiogram January 2023,  Normal LV function Mild MR  Previously declined amiodarone   PMH:   has a past medical history of Abnormal cardiac valve, Anxiety, Arthritis, Chest pain, Chronic low back pain, Colitis, chronic, ulcerative (HCC), Complication of anesthesia, Dysrhythmia, Family history of adverse reaction to anesthesia, GERD (gastroesophageal reflux disease), HTN (hypertension), Hyperlipidemia, Hypothyroidism, LBBB (left bundle branch block), Low blood potassium, PAF (paroxysmal atrial fibrillation) (HCC), Palpitations, Recurrent UTI, Sarcoidosis, Sinusitis, Skin cancer, Sleep apnea, Thyroid disease, Vitamin D deficiency, and Wears dentures.  PSH:    Past Surgical History:  Procedure Laterality Date   BREAST CYST ASPIRATION Left    neg   BREAST CYST ASPIRATION Right 09/25/2019   CATARACT EXTRACTION W/PHACO Right 04/06/2017   Procedure: CATARACT EXTRACTION PHACO AND INTRAOCULAR LENS PLACEMENT (IOC);  Surgeon: Galen Manila, MD;  Location: [facility] ORS;  Service: Ophthalmology;  Laterality: Right;  [place] 00:36.1 AP% 18.5 CDE 6.66 fluid Pack lot # [id number]   CATARACT EXTRACTION W/PHACO Left 04/13/2022   Procedure: CATARACT EXTRACTION PHACO AND INTRAOCULAR LENS PLACEMENT (IOC) LEFT 8.82 00:40.8;  Surgeon: Nevada Crane, MD;  Location: [facility];  Service: Ophthalmology;  Laterality: Left;  sleep apnea   COLONOSCOPY WITH PROPOFOL N/A 08/06/2015   Procedure: COLONOSCOPY WITH PROPOFOL;  Surgeon: Scot Jun, MD;  Location: [facility] ENDOSCOPY;  Service: Endoscopy;  Laterality: N/A;   COLONOSCOPY WITH PROPOFOL N/A 01/15/2016   Procedure: COLONOSCOPY WITH PROPOFOL;  Surgeon: Scot Jun, MD;  [facility];  Service: Endoscopy;  Laterality: N/A;   Urinary procedure      Current Outpatient Medications  Medication Sig Dispense  Refill   ALPRAZolam (XANAX) 0.5 MG tablet Take 0.5 mg by mouth at bedtime.     apixaban (ELIQUIS) 5 MG TABS tablet Take 5 mg by mouth 2 (two) times daily.     calcium carbonate (TUMS - DOSED IN MG ELEMENTAL CALCIUM) 500 MG chewable tablet Chew 1-2 tablets by mouth 3 (three) times daily as needed for indigestion or heartburn.     dicyclomine (BENTYL) 20 MG tablet Take 10 mg by mouth 2 (two) times daily.     isosorbide mononitrate (IMDUR) 60 MG 24 hr tablet Take 90 mg by mouth in the morning and at bedtime.     levothyroxine (SYNTHROID) 75 MCG tablet Take by mouth.     metoprolol succinate (TOPROL-XL) 25 MG 24 hr tablet Take 25 mg by mouth daily.     valsartan (DIOVAN) 160 MG tablet Take 160 mg by mouth 2 (two) times daily.     verapamil (CALAN-SR) 120 MG CR tablet Take 1 tablet (120 mg total) by mouth daily. 90 tablet 0   Vitamin D, Ergocalciferol, (DRISDOL) 1.25 MG (50000 UNIT) CAPS capsule Take 50,000 Units by mouth every 7 (seven) days.     No current facility-administered medications for this visit.    Allergies:   Iohexol, Sulfa antibiotics, Clonidine derivatives, Floxin [ofloxacin], Hydralazine hcl, Lotrel [amlodipine besy-benazepril hcl], Potassium-containing compounds, Protonix [pantoprazole sodium], and Toprol xl [metoprolol succinate]   Social History:  The patient  reports that she has never smoked. She has never used smokeless tobacco. She reports that she does not drink alcohol and does not use drugs.   Family History:   family history includes Heart disease in her father; Hypertension in her father; Pneumonia in her father; Rheum arthritis in her mother; Stroke in her father and mother.    Review of Systems: Review of Systems  Constitutional: Negative.   HENT: Negative.    Respiratory: Negative.    Cardiovascular: Negative.   Gastrointestinal: Negative.   Musculoskeletal: Negative.   Neurological: Negative.   Psychiatric/Behavioral: Negative.    All other systems reviewed  and are negative.   PHYSICAL EXAM: VS:  BP (!) 180/70 (BP Location: Left Arm, Patient Position: Sitting, Cuff Size: Normal)   Pulse (!) 59   Ht 5' 1.5" (1.562 m)   Wt 152 lb 8 oz (69.2 kg)   SpO2 98%   BMI 28.35 kg/m  , BMI Body mass index is 28.35 kg/m. Constitutional:  oriented to person, place, and time. No distress.  HENT:  Head: Grossly normal Eyes:  no discharge. No scleral icterus.  Neck: No JVD, no carotid bruits  Cardiovascular: Regular rate and rhythm, no murmurs appreciated Pulmonary/Chest: Clear to auscultation bilaterally, no wheezes or rails Abdominal: Soft.  no distension.  no tenderness.  Musculoskeletal: Normal range of motion Neurological:  normal muscle tone. Coordination normal. No atrophy Skin: Skin warm and dry Psychiatric: normal affect, pleasant  Recent Labs: 10/29/2022: Magnesium 1.7; TSH 1.462 04/22/2023: ALT 16 04/23/2023: B Natriuretic Peptide 412.5 04/24/2023: BUN 25; Creatinine, Ser 1.18; Hemoglobin 12.4; Platelets 279; Potassium 3.9; Sodium 123    Lipid Panel No results found for: "CHOL", "HDL", "LDLCALC", "TRIG"    Wt Readings from Last 3 Encounters:  09/20/23 152 lb 8 oz (69.2 kg)  08/20/23 150 lb 2 oz (68.1 kg)  06/14/23 148 lb 3.2 oz (67.2 kg)     ASSESSMENT AND PLAN:  Problem List Items Addressed This Visit       Cardiology Problems   Atrial fibrillation with rapid ventricular response (HCC)   Relevant Medications   isosorbide mononitrate (IMDUR) 60 MG 24 hr tablet   metoprolol succinate (TOPROL-XL) 25 MG 24 hr tablet   Other Relevant Orders   EKG 12-Lead (Completed)   HTN (hypertension)   Relevant Medications   isosorbide mononitrate (IMDUR) 60 MG 24 hr tablet   metoprolol succinate (TOPROL-XL) 25 MG 24 hr tablet   Other Relevant Orders   EKG 12-Lead (Completed)   Other Visit Diagnoses       PAF (paroxysmal atrial fibrillation) (HCC)    -  Primary   Relevant Medications   isosorbide mononitrate (IMDUR) 60 MG 24 hr  tablet   metoprolol succinate (TOPROL-XL) 25 MG 24 hr tablet   Other Relevant Orders   EKG 12-Lead (Completed)     Chronic diastolic heart failure (HCC)       Relevant Medications   isosorbide mononitrate (IMDUR) 60 MG 24 hr tablet   metoprolol succinate (TOPROL-XL) 25 MG 24 hr tablet   Other Relevant Orders   EKG 12-Lead (Completed)       Paroxysmal atrial fibrillation Maintaining normal sinus rhythm Continue metoprolol succinate 25 daily Continue Eliquis 5 twice daily  Essential hypertension Poorly controlled hypertension Numerous medication intolerances Recommend she continue valsartan 160 mg twice daily, Verapamil ER down to 60 mg daily, Metoprolol 25 mg daily at bedtime Isosorbide mononitrate 90 BID Will add Cardura 2 twice daily Reduced dose verapamil ER given intolerances  Coronary calcification Score 113, prefers not to be on a statin Denies anginal symptoms  Hyperlipidemia Prefers not to be on a statin Consider Zetia though may be challenging given medication intolerance  Gait instability Recommend exercise program   Signed, Dossie Arbour, M.D., Ph.D. [facility] [place], [place] [phone]

## 2023-09-20 NOTE — Patient Instructions (Addendum)
 Medication Instructions:  Please start cardura/doxazosin 2 mg twice a day Cut the verpamil in 1/2 daily (60 mg daily)   For high pressure, >165, take additional 1/2 verapamil (60 mg x 1)  If you need a refill on your cardiac medications before your next appointment, please call your pharmacy.   Lab work: No new labs needed  Testing/Procedures: No new testing needed  Follow-Up: At [facility], you and your health needs are our priority.  As part of our continuing mission to provide you with exceptional heart care, we have created designated Provider Care Teams.  These Care Teams include your primary Cardiologist (physician) and Advanced Practice Providers (APPs -  Physician Assistants and Nurse Practitioners) who all work together to provide you with the care you need, when you need it.  You will need a follow up appointment in 1 month  Providers on your designated Care Team:   Kristi Ducking, NP Kristi Listen, PA-C Kristi Barrera, [place]  COVID-19 Vaccine Information can be found at: [email or web address] For questions related to vaccine distribution or appointments, please email [email or web address] or call [phone].

## 2023-09-20 NOTE — Telephone Encounter (Signed)
 Pt c/o BP issue:  1. What are your last 5 BP readings? This morning 168/97 pulse 44,  171/96 pulse 45yesterday171/96 pulse 45 168/92 pulse 44, 169/86 pulse 47 174/92 pulse 4254/7 2. Are you having any other symptoms (ex. Dizziness, headache, blurred vision, passed out)? Head does not feel right, it aches and she says she feels really weak  3. What is your medication issue? Blood pressure is high- Patient wants to be seen

## 2023-09-21 NOTE — Telephone Encounter (Signed)
 Patient seen in clinic 09/20/23 and all questions and concerns addressed.

## 2023-10-20 NOTE — Progress Notes (Deleted)
 Cardiology Clinic Note   Date: 10/20/2023 ID: Kristi Barrera, DOB Dec 06, 1940, MRN [id number]  Primary Cardiologist:  Julien Nordmann, MD  Chief Complaint   Kristi Barrera is a 83 y.o. female who presents to the clinic today for ***  Patient Profile   Kristi Barrera is followed by *** for the history outlined below.      Past medical history significant for: PAF. Chronic diastolic heart failure. Echo 04/23/2023: EF 55 to 60%.  No RWMA.  Mild LVH.  Grade I DD.  Normal RV size/function.  Mild MR.  Mild to moderate AI. Pulmonary artery hypertension. Hypertension. Renal artery duplex 07/31/2021: Isolated elevated peak systolic velocity within the mid aspect of the right renal arteries without associated abnormal right renal artery/aortic ratio or associated asymmetric renal atrophy, favored to be technique related though could be seen in the setting of early/mild renal artery stenosis. Hyperlipidemia. CT cardiac scoring 01/14/2023: Coronary calcium score 113 (53rd percentile).  LAD 83.9, LCx 29.4. Lipid panel 04/01/2023: LDL 120, HDL 45, TG 70, total 179. OSA. Sarcoidosis. Hypothyroidism. GI bleed.  In summary, patient was previously followed by [facility] clinic cardiology.  Prior echo March 2022 revealed normal LV/RV size and function, EF 50%, severe MR, moderate TR.  Repeat echo January 2023 demonstrated normal LV function and mild MR.  She had an episode of A-fib with RVR in April 2023 treated in the emergency department with medications converting to NSR.  Hospital admission 01/10/2023 for A-fib.  Verapamil was increased and as needed metoprolol was added.  She converted back to NSR in the emergency department.  Patient was first evaluated by Dr. Mariah Milling on 02/19/2023 for PAF at the request of PCP.  She reported adherence to Eliquis 5 mg twice daily.  She deferred amiodarone as previously offered by [facility] clinic.  It was recommended she start Toprol titrating to 25  mg daily and continue verapamil daily.  Use of flecainide for breakthrough arrhythmia was considered.  CT calcium scoring demonstrated a calcium score of 113 (LAD and LCx).  Patient had declined statin therapy.  Patient [facility] to [facility] ED on 04/22/2023 with sudden onset of shortness of breath.  SpO2 88% on room air.  She improved to 94% on 3 L of O2 via Benjamin Perez.  Fluids were stopped when shortness of breath worsened.  Her husband had recently been diagnosed with COVID and a couple of days later she lost taste and experience nausea and occasional diarrhea.  She was started on antivirals by PCP that were finished on the day of her arrival to the ED.  Initial vitals demonstrated BP 186/93, HR 58 bpm, respirations 19, afebrile.  COVID PCR was positive sodium 122, creatinine 1.03.  Chest x-ray showed interval increase central vascular prominence and interstitial consolidation consistent with CHF or fluid overload with small pericardial effusions beginning to form, background chronic interstitial changes.  She was diuresed with IV Lasix.  Echo showed normal LV/RV function, mild LVH, Grade I DD, mild MR, mild to moderate AI.   Upon follow up in November 2024, patient complained of noticing HR in the 40s first thing in the morning that resolved after eating breakfast. She was instructed to take verapamil in the morning after breakfast instead of in the evening to see if this helped resolve first thing in the morning bradycardia. She followed up with Dr. Mariah Milling in January 2025 and reported feeling weak and nervous after taking verapamil. She was instructed to cut verapamil in half and increase Toprol  to bid dosing.   Patient was last seen in the office by Dr. Mariah Milling on 09/20/2023 for evaluation of elevated BP.  She reported since decreasing verapamil and increasing Toprol she began noticing increase in BP with SBP in the 160s or higher.  Verapamil was increased back to full dose and isosorbide was increased to 90 mg twice  daily.  BP at the time of her visit was 180/70.  She was maintaining normal sinus rhythm.  She was instructed to continue valsartan 160 mg twice daily, verapamil was decreased to 60 mg daily, continue Toprol 25 mg at bedtime, isosorbide 90 mg twice daily, and add Cardura 2 mg twice daily.     History of Present Illness    Today, patient ***  PAF Patient denies palpitations or spontaneous bleeding concerns. RRR today.***  -Continue Toprol, verapamil, Eliquis. Appropriate Eliquis dose.***   Chronic diastolic heart failure Echo October 2024 showed normal LV/RV function, mild LVH, Grade I DD, mild MR, mild to moderate AI.  Patient denies lower extremity edema, DOE, orthopnea or PND.  Euvolemic and well compensated on exam.*** -Continue Toprol, valsartan, isosorbide.   Hypertension BP today***. No dizziness or headaches reported.*** -Continue valsartan, Cardura, verapamil, Toprol, isosorbide.   Hyperlipidemia/Statin intolerance.  LDL September 2024 120.  Patient continues to decline statin.*** -Continue to follow with PCP.   ROS: All other systems reviewed and are otherwise negative except as noted in History of Present Illness.  EKGs/Labs Reviewed        04/22/2023: ALT 16; AST 21 04/24/2023: BUN 25; Creatinine, Ser 1.18; Potassium 3.9; Sodium 123   04/24/2023: Hemoglobin 12.4; WBC 8.8   10/29/2022: TSH 1.462   04/23/2023: B Natriuretic Peptide 412.5  ***  Risk Assessment/Calculations    {Does this patient have ATRIAL FIBRILLATION?[phone]} No BP recorded.  {Refresh Note OR Click here to enter BP  :1}***        Physical Exam    VS:  There were no vitals taken for this visit. , BMI There is no height or weight on file to calculate BMI.  GEN: Well nourished, well developed, in no acute distress. Neck: No JVD or carotid bruits. Cardiac: *** RRR. No murmurs. No rubs or gallops.   Respiratory:  Respirations regular and unlabored. Clear to auscultation without rales, wheezing  or rhonchi. GI: Soft, nontender, nondistended. Extremities: Radials/DP/PT 2+ and equal bilaterally. No clubbing or cyanosis. No edema ***  Skin: Warm and dry, no rash. Neuro: Strength intact.  Assessment & Plan   ***  Disposition: ***     {Are you ordering a CV Procedure (e.g. stress test, cath, DCCV, TEE, etc)?   Press F2        :[id number]}   Signed, Etta Grandchild. Merrick Maggio, DNP, NP-C

## 2023-10-25 ENCOUNTER — Ambulatory Visit: Admitting: Student

## 2023-11-02 NOTE — Progress Notes (Signed)
 Cardiology Clinic Note   Date: 11/05/2023 ID: KEYANDRA SWENSON, DOB [birth year], MRN [id number]  Primary Cardiologist:  Belva Boyden, MD  Chief Complaint   Kristi Barrera is a 83 y.o. female who presents to the clinic today for follow up after medication changes.   Patient Profile   Kristi Barrera is followed by Dr. Gollan for the history outlined below.      Past medical history significant for: PAF. Chronic diastolic heart failure. Echo 04/23/2023: EF 55 to 60%.  No RWMA.  Mild LVH.  Grade I DD.  Normal RV size/function.  Mild MR.  Mild to moderate AI. Pulmonary artery hypertension. Hypertension. Renal artery duplex 07/31/2021: Isolated elevated peak systolic velocity within the mid aspect of the right renal arteries without associated abnormal right renal artery/aortic ratio or associated asymmetric renal atrophy, favored to be technique related though could be seen in the setting of early/mild renal artery stenosis. Hyperlipidemia. CT cardiac scoring 01/14/2023: Coronary calcium  score 113 (53rd percentile).  LAD 83.9, LCx 29.4. Lipid panel 04/01/2023: LDL 120, HDL 45, TG 70, total 179. OSA. Sarcoidosis. Hypothyroidism. GI bleed.  In summary, patient was previously followed by [facility] clinic cardiology.  Prior echo March 2022 revealed normal LV/RV size and function, EF 50%, severe MR, moderate TR.  Repeat echo January 2023 demonstrated normal LV function and mild MR.  She had an episode of A-fib with RVR in April 2023 treated in the emergency department with medications converting to NSR.  Hospital admission 01/10/2023 for A-fib.  Verapamil  was increased and as needed metoprolol  was added.  She converted back to NSR in the emergency department.  Patient was first evaluated by Dr. Gollan on 02/19/2023 for PAF at the request of PCP.  She reported adherence to Eliquis  5 mg twice daily.  She deferred amiodarone as previously offered by [facility] clinic.  It was  recommended she start Toprol  titrating to 25 mg daily and continue verapamil  daily.  Use of flecainide for breakthrough arrhythmia was considered.  CT calcium  scoring demonstrated a calcium  score of 113 (LAD and LCx).  Patient had declined statin therapy.  Patient presented to [facility] ED on 04/22/2023 with sudden onset of shortness of breath.  SpO2 88% on room air.  She improved to 94% on 3 L of O2 via [place].  Fluids were stopped when shortness of breath worsened.  Her husband had recently been diagnosed with COVID and a couple of days later she lost taste and experience nausea and occasional diarrhea.  She was started on antivirals by PCP that were finished on the day of her arrival to the ED.  Initial vitals demonstrated BP 186/93, HR 58 bpm, respirations 19, afebrile.  COVID PCR was positive sodium 122, creatinine 1.03.  Chest x-ray showed interval increase central vascular prominence and interstitial consolidation consistent with CHF or fluid overload with small pericardial effusions beginning to form, background chronic interstitial changes.  She was diuresed with IV Lasix .  Echo showed normal LV/RV function, mild LVH, Grade I DD, mild MR, mild to moderate AI.   Upon follow up in November 2024, patient complained of noticing HR in the 40s first thing in the morning that resolved after eating breakfast. She was instructed to take verapamil  in the morning after breakfast instead of in the evening to see if this helped resolve first thing in the morning bradycardia. She followed up with Dr. Gollan in January 2025 and reported feeling weak and nervous after taking verapamil . She was instructed to cut  verapamil  in half and increase Toprol  to bid dosing.   Patient was last seen in the office by Dr. Gollan on 09/20/2023 for evaluation of elevated BP.  She reported since decreasing verapamil  and increasing Toprol  she began noticing increase in BP with SBP in the 160s or higher.  Verapamil  was increased back to full dose  and isosorbide  was increased to 90 mg twice daily.  BP at the time of her visit was 180/70.  She was maintaining normal sinus rhythm.  She was instructed to continue valsartan 160 mg twice daily, verapamil  was decreased to 60 mg daily, continue Toprol  25 mg at bedtime, isosorbide  90 mg twice daily, and add Cardura  2 mg twice daily.     History of Present Illness    Today, patient is doing well. She reports BP is better controlled. She has been checking her BP in the morning prior to taking any medications and is getting SBP readings between 150-170. She denies headaches, dizziness or blurred vision. She reports she knows when her BP is elevated because she feels nervous. Patient denies shortness of breath, dyspnea on exertion, lower extremity edema, orthopnea or PND. No chest pain, pressure, or tightness. No palpitations.  Her husband recently got her a seated elliptical. She tried it twice and noted her heart rate increased. She was concerned that it may get too high and she should not use the machine.     ROS: All other systems reviewed and are otherwise negative except as noted in History of Present Illness.  EKGs/Labs Reviewed        04/22/2023: ALT 16; AST 21 04/24/2023: BUN 25; Creatinine, Ser 1.18; Potassium 3.9; Sodium 123   04/24/2023: Hemoglobin 12.4; WBC 8.8   No results found for requested labs within last 365 days.   04/23/2023: B Natriuretic Peptide 412.5   Risk Assessment/Calculations     CHA2DS2-VASc Score = 5   This indicates a 7.2% annual risk of stroke. The patient's score is based upon: CHF History: 1 HTN History: 1 Diabetes History: 0 Stroke History: 0 Vascular Disease History: 0 Age Score: 2 Gender Score: 1             Physical Exam    VS:  BP 130/82   Pulse 82   Ht 5' 1.5" (1.562 m)   Wt 153 lb 12.8 oz (69.8 kg)   SpO2 92%   BMI 28.59 kg/m  , BMI Body mass index is 28.59 kg/m.  GEN: Well nourished, well developed, in no acute distress. Neck: No  JVD or carotid bruits. Cardiac:  RRR. No murmurs. No rubs or gallops.   Respiratory:  Respirations regular and unlabored. Clear to auscultation without rales, wheezing or rhonchi. GI: Soft, nontender, nondistended. Extremities: Radials/DP/PT 2+ and equal bilaterally. No clubbing or cyanosis. No edema.  Skin: Warm and dry, no rash. Neuro: Strength intact.  Assessment & Plan   PAF Patient denies palpitations or spontaneous bleeding concerns. RRR today. She tried using a seated elliptical and felt her heart rate elevate and was concerned that she should not be using it. Discussed starting to use it in short intervals with 5 minutes twice a day to start and slowly increasing as she tolerates. She voiced understanding.  -Increase physical activity as tolerated.   -Continue Toprol , verapamil , Eliquis . Appropriate Eliquis  dose.   Chronic diastolic heart failure Echo October 2024 showed normal LV/RV function, mild LVH, Grade I DD, mild MR, mild to moderate AI.  Patient denies  DOE, orthopnea or PND.  She sleeps on 2 pillows for comfort while using CPAP. She reports mild edema in bilateral ankles by the end of the day. Euvolemic and well compensated on exam. -Continue Toprol , valsartan, isosorbide .   Hypertension BP today 130/82. No dizziness, blurred vision, or headaches reported. -Continue valsartan, Cardura , verapamil , Toprol , isosorbide .   Hyperlipidemia/Statin intolerance.  LDL September 2024 120.  Patient continues to decline statin. -Continue to follow with PCP.   Disposition: Return in 3 months per patient request or sooner as needed.          Signed, Lonell Rives. Joyceann Kruser, DNP, NP-C

## 2023-11-05 ENCOUNTER — Telehealth: Payer: Self-pay | Admitting: Cardiovascular Disease

## 2023-11-05 ENCOUNTER — Encounter: Payer: Self-pay | Admitting: Student

## 2023-11-05 ENCOUNTER — Ambulatory Visit: Attending: Student | Admitting: Student

## 2023-11-05 VITALS — BP 130/82 | HR 82 | Ht 61.5 in | Wt 153.8 lb

## 2023-11-05 DIAGNOSIS — I5032 Chronic diastolic (congestive) heart failure: Secondary | ICD-10-CM | POA: Diagnosis not present

## 2023-11-05 DIAGNOSIS — E7800 Pure hypercholesterolemia, unspecified: Secondary | ICD-10-CM

## 2023-11-05 DIAGNOSIS — I10 Essential (primary) hypertension: Secondary | ICD-10-CM | POA: Diagnosis not present

## 2023-11-05 DIAGNOSIS — I480 Paroxysmal atrial fibrillation: Secondary | ICD-10-CM | POA: Diagnosis not present

## 2023-11-05 DIAGNOSIS — T466X5A Adverse effect of antihyperlipidemic and antiarteriosclerotic drugs, initial encounter: Secondary | ICD-10-CM

## 2023-11-05 DIAGNOSIS — G720 Drug-induced myopathy: Secondary | ICD-10-CM

## 2023-11-05 MED ORDER — ISOSORBIDE MONONITRATE ER 60 MG PO TB24: 90.0000 mg | ORAL_TABLET | Freq: Two times a day (BID) | ORAL | 3 refills | Status: AC

## 2023-11-05 NOTE — Patient Instructions (Signed)
 Medication Instructions:  No changes at this time.   *If you need a refill on your cardiac medications before your next appointment, please call your pharmacy*  Lab Work: None  If you have labs (blood work) drawn today and your tests are completely normal, you will receive your results only by: MyChart Message (if you have MyChart) OR A paper copy in the mail If you have any lab test that is abnormal or we need to change your treatment, we will call you to review the results.  Testing/Procedures: None  Follow-Up: At [facility], you and your health needs are our priority.  As part of our continuing mission to provide you with exceptional heart care, our providers are all part of one team.  This team includes your primary Cardiologist (physician) and Advanced Practice Providers or APPs (Physician Assistants and Nurse Practitioners) who all work together to provide you with the care you need, when you need it.  Your next appointment:   3 month(s)  Provider:   Timothy Gollan, MD or Morey Ar, NP

## 2023-11-05 NOTE — Telephone Encounter (Signed)
*  STAT* If patient is at the pharmacy, call can be transferred to refill team.   1. Which medications need to be refilled? (please list name of each medication and dose if known)  isosorbide  mononitrate (IMDUR ) 60 MG 24 hr tablet    2. Would you like to learn more about the convenience, safety, & potential cost savings by using the [facility]? No    3. Are you open to using the [facility] (Type [facility].  ).No   4. Which pharmacy/location (including street and city if local pharmacy) is medication to be sent to?  [facility] [address], [place] - [address]     5. Do they need a 30 day or 90 day supply? 90 day

## 2023-11-05 NOTE — Telephone Encounter (Signed)
 Pt's medication was sent to pt's pharmacy as requested. Confirmation received.

## 2023-11-23 ENCOUNTER — Telehealth: Payer: Self-pay | Admitting: Cardiovascular Disease

## 2023-11-23 MED ORDER — APIXABAN 5 MG PO TABS: 5.0000 mg | ORAL_TABLET | Freq: Two times a day (BID) | ORAL | 1 refills | Status: DC

## 2023-11-23 NOTE — Telephone Encounter (Signed)
 Pt last saw Morey Ar, NP on 11/05/23, last labs 10/07/23 Creat 1.0, age 83, weight 69.8kg, based on specified criteria pt is on appropriate dosage of Eliquis  5mg  BID for afib.  Will refill rx.

## 2023-11-23 NOTE — Telephone Encounter (Signed)
*  STAT* If patient is at the pharmacy, call can be transferred to refill team.   1. Which medications need to be refilled? (please list name of each medication and dose if known) apixaban (ELIQUIS) 5 MG TABS tablet  2. Which pharmacy/location (including street and city if local pharmacy) is medication to be sent to?[id number] - [place], [place] - [address]  3. Do they need a 30 day or 90 day supply? 90  

## 2023-12-24 ENCOUNTER — Telehealth: Payer: Self-pay | Admitting: Cardiovascular Disease

## 2023-12-24 NOTE — Telephone Encounter (Signed)
 Spoke with patient regarding HR and BP. She sounds a lot better over the phone, says she is not sob or dizzy and has a lot more energy. She said after taking her medication she feels a lot better and she has "had a fib for so long she knows when her heart rate is back to normal".  I advised her not to take another metoprolol  until we get further recommendations from dr of the day. She said she would call back if she felt her heart rhythm and rate go up again.

## 2023-12-24 NOTE — Telephone Encounter (Signed)
 Pt c/o medication issue:  1. Name of Medication:   metoprolol  succinate (TOPROL -XL) 25 MG 24 hr tablet   2. How are you currently taking this medication (dosage and times per day)?   3. Are you having a reaction (difficulty breathing--STAT)?   4. What is your medication issue?    Patient called to report she took the metoprolol  succinate (TOPROL -XL) 25 MG 24 hr tablet medication as prescribed and her heart has slowed down.  Patient wants to know if her heart starts racing again if she should take another dose.

## 2023-12-24 NOTE — Telephone Encounter (Signed)
 Patient c/o Palpitations:  STAT if patient reporting lightheadedness, shortness of breath, or chest pain  How long have you had palpitations/irregular HR/ Afib? Are you having the symptoms now? During the night, yes  Are you currently experiencing lightheadedness, SOB or CP? no  Do you have a history of afib (atrial fibrillation) or irregular heart rhythm? yes  Have you checked your BP or HR? (document readings if available): 180/117 140  Are you experiencing any other symptoms? no

## 2023-12-24 NOTE — Telephone Encounter (Signed)
 Called patient back to discuss concerns. She was audibly short of breath over the phone. I asked her if this was a normal finding for her. She explained she had just gotten up to use the bathroom and walk around the house and was going to make some toast and take her morning medications. She said she is typically sob when she is in fast a fib. She said she had taken her toprol  and that helped with her heart rate some, but she was not sure what her rate was at that time. She also stated that she had been taking her blood pressure with her machine on her wrist and the top of her arm. I advised her that with an irregular heartbeat, atrial fibrillation can distort the value of the blood pressure, making it extremely high one time, and extremely low the next time based on the irregular rhythm. Advised her on an automatic blood pressure not always being accurate with a fib with rvr and we would typically use a manual cuff for accuracy. Patient verbalized understanding. She said she was going to take her morning medications and wait 1-2 hours and retake her blood pressure and heart rate and call us  back with the value. I did recommend that if the medications do not work for this, she may need to go to the hospital for further workup and possible IV medication for a fib with RVR. Patient verbalized understanding and would call us  back with any concerns.

## 2024-01-11 ENCOUNTER — Telehealth: Payer: Self-pay | Admitting: Cardiovascular Disease

## 2024-01-11 NOTE — Telephone Encounter (Signed)
 Patient is following up with up[date. She just wanted to inform the clinical team that she feels better. Her BP has come down and she is resting in her recliner right now. She says if it gets worse she plans to go to the ED--just an FYI.

## 2024-01-11 NOTE — Telephone Encounter (Signed)
 Called and spoke with the patient. Patient denies any chest pain, shortness of breath, dizziness or any other concerning symptoms.  Patient states feeling like my heart is racing Patient asked how does she know that she is in A-Fib, and patient states she just feels like its racing. Patient asked about her medications, and advised to take her regular medications that she has not yet taken this morning. Patient also advised to stay hydrated and do some breathing exercises to relax to decrease her heart rate and blood pressure. Patient also advised to call us  back if her symptoms do not improve in the next hour or so and to call 911 or go to the emergency room for any concerning symptoms such as chest pain, dizziness, or shortness of breath. Patient verbalized understanding with all questions and concerns addressed.

## 2024-01-11 NOTE — Telephone Encounter (Signed)
 Patient c/o Palpitations: STAT if patient c/o lightheadedness, shortness of breath, or chest pain  How long have you had palpitations/irregular HR/ Afib? Are you having the symptoms now?  Patient says she went into afib around 8:00 AM and she's still in afib.  Are you currently experiencing lightheadedness, SOB or CP?  No   Do you have a history of afib (atrial fibrillation) or irregular heart rhythm?  Yes   Have you checked your BP or HR? (document readings if available):  Patient says she can tell her BP is elevated but she got a new BP machine and she's unable to get a reading on it.  Are you experiencing any other symptoms?  Headache

## 2024-01-18 ENCOUNTER — Encounter: Payer: Self-pay | Admitting: Cardiovascular Disease

## 2024-01-18 ENCOUNTER — Ambulatory Visit: Attending: Cardiovascular Disease | Admitting: Cardiovascular Disease

## 2024-01-18 ENCOUNTER — Telehealth: Payer: Self-pay | Admitting: Cardiovascular Disease

## 2024-01-18 VITALS — BP 140/70 | HR 66 | Ht 61.5 in | Wt 149.0 lb

## 2024-01-18 DIAGNOSIS — I480 Paroxysmal atrial fibrillation: Secondary | ICD-10-CM

## 2024-01-18 DIAGNOSIS — I4891 Unspecified atrial fibrillation: Secondary | ICD-10-CM

## 2024-01-18 DIAGNOSIS — I5032 Chronic diastolic (congestive) heart failure: Secondary | ICD-10-CM

## 2024-01-18 DIAGNOSIS — I10 Essential (primary) hypertension: Secondary | ICD-10-CM

## 2024-01-18 DIAGNOSIS — I2510 Atherosclerotic heart disease of native coronary artery without angina pectoris: Secondary | ICD-10-CM

## 2024-01-18 MED ORDER — METOPROLOL TARTRATE 50 MG PO TABS: 50.0000 mg | ORAL_TABLET | Freq: Two times a day (BID) | ORAL | 3 refills | Status: DC | PRN

## 2024-01-18 NOTE — Patient Instructions (Addendum)
 Medication Instructions:   Please take metoprolol  tartrate 50 mg up to twice a day as needed for afib spells  If you need a refill on your cardiac medications before your next appointment, please call your pharmacy.   Lab work: No new labs needed  Testing/Procedures: No new testing needed  Follow-Up: At [facility], you and your health needs are our priority.  As part of our continuing mission to provide you with exceptional heart care, we have created designated Provider Care Teams.  These Care Teams include your primary Cardiologist (physician) and Advanced Practice Providers (APPs -  Physician Assistants and Nurse Practitioners) who all work together to provide you with the care you need, when you need it.  You will need a follow up appointment in 6 months  Providers on your designated Care Team:   Lonni Meager, NP Bernardino Bring, PA-C Cadence [place], [place]  COVID-19 Vaccine Information can be found at: [email or web address] For questions related to vaccine distribution or appointments, please email [email or web address] or call [phone].

## 2024-01-18 NOTE — Telephone Encounter (Signed)
 Spoke with pt. Pt states that she went into AFIB around 9 am, she took her Metoprolol  25 mg at 9:30, but heart continued to race;  Pt experiencing SOB with exertion and headache.  BP 204/102 HR 90,  175/113 HR 86, denies chest pain, states she has taken all of her morning medications except for her Valsartan 160mg .  I instructed her to go ahead and take her Valsartan and with her BP being so high, she should go to ED.  Pt states that her husband has Parkinson's and she would have to call 911 and she did not want to do that at this time.  I checked schedule and saw that Dr. Gollan had an opening for 11a and 3:20p...the patient felt she couldn't make the 11a appt, so I scheduled her to be seen at 3:20p.  I instructed her to call 911 if her symptoms got worse.  Pt verbalized understanding.

## 2024-01-18 NOTE — Progress Notes (Addendum)
 Cardiology Office Note  Date:  01/18/2024   ID:  Kristi Barrera, DOB Nov 18, 1940, MRN [id number]  PCP:  Kristi Reyes BIRCH, MD   Chief Complaint  Patient presents with   Atrial Fibrillation    Patient c/o A-fib spell today from 8:00-12:30 with also having a similar spell two weeks ago. The patient was having racing heart beats and an increase in her blood pressure when having the A-Fib spell.     HPI:  [name] is a 83 year old woman with past medical history of Atrial fibrillation, paroxysmal Hypertension Hyperlipidemia Osa on CPAP Calcium  scoring 113 Who presents for follow-up of her paroxysmal atrial fibrillation, HTN  Last seen by myself in clinic 09/2023  Long review of her medications, reports that she has been taking verapamil  SR 120 mg daily Metoprolol  succinate 25 twice daily Isosorbide  90 mg twice daily, Cardura  2 mg twice daily  She has not been checking her blood pressure consistently at home but reports it is typically reasonably well-controlled  2 recent episodes of atrial fibrillation in the past 2 weeks otherwise had been doing well for 6 months on the above regimen  Episode of atrial fibrillation this morning woke up 8:00 with atrial fibrillation, took her morning medications and rhythm broke at 12:30 over 4 hours later  Reports having some wild dreams that may have contributed to A-fib above Feels depressed, lost 2 friends  EKG personally reviewed by myself on todays visit EKG Interpretation Date/Time:  Tuesday January 18 2024 15:08:04 EDT Ventricular Rate:  66 PR Interval:  200 QRS Duration:  146 QT Interval:  440 QTC Calculation: 461 R Axis:   -1  Text Interpretation: Normal sinus rhythm Left bundle branch block When compared with ECG of 20-Sep-2023 16:15, No significant change was found Confirmed by Perla Lye [phone]) on 01/18/2024 3:24:05 PM   Several medication intolerances Spionolactone caused low sodium, renal  dysfunction Diltiazem  causes sickness /weak Clonidine: chest tightness Hydralazine: weak among other intolerances  Covid 10/24 Hospitalized with respiratory distress D/c with lasix , was stopped as outpt  Lab work reviewed totaL CHOL 179, LDL 120  EKG personally reviewed by myself on todays visit EKG Interpretation Date/Time:  Tuesday January 18 2024 15:08:04 EDT Ventricular Rate:  66 PR Interval:  200 QRS Duration:  146 QT Interval:  440 QTC Calculation: 461 R Axis:   -1  Text Interpretation: Normal sinus rhythm Left bundle branch block When compared with ECG of 20-Sep-2023 16:15, No significant change was found Confirmed by Perla Lye [phone]) on 01/18/2024 3:24:05 PM    Atrial fibrillation April, 2023   converted to NSR in the ER  Echocardiogram March 2022,  Normal left and right ventricular size and function  EF 50% severe MR Moderate TR  Repeat echocardiogram January 2023,  Normal LV function Mild MR  Previously declined amiodarone   PMH:   has a past medical history of Abnormal cardiac valve, Anxiety, Arthritis, Chest pain, Chronic low back pain, Colitis, chronic, ulcerative (HCC), Complication of anesthesia, Dysrhythmia, Family history of adverse reaction to anesthesia, GERD (gastroesophageal reflux disease), HTN (hypertension), Hyperlipidemia, Hypothyroidism, LBBB (left bundle branch block), Low blood potassium, PAF (paroxysmal atrial fibrillation) (HCC), Palpitations, Recurrent UTI, Sarcoidosis, Sinusitis, Skin cancer, Sleep apnea, Thyroid  disease, Vitamin D  deficiency, and Wears dentures.  PSH:    Past Surgical History:  Procedure Laterality Date   BREAST CYST ASPIRATION Left    neg   BREAST CYST ASPIRATION Right 09/25/2019   CATARACT EXTRACTION W/PHACO Right 04/06/2017  Procedure: CATARACT EXTRACTION PHACO AND INTRAOCULAR LENS PLACEMENT (IOC);  Surgeon: Jaye Fallow, MD;  Location: [facility] ORS;  Service: Ophthalmology;  Laterality: Right;  US  00:36.1 AP%  18.5 CDE 6.66 fluid Pack lot # [id number]   CATARACT EXTRACTION W/PHACO Left 04/13/2022   Procedure: CATARACT EXTRACTION PHACO AND INTRAOCULAR LENS PLACEMENT (IOC) LEFT 8.82 00:40.8;  Surgeon: Myrna Adine Anes, MD;  Location: [facility];  Service: Ophthalmology;  Laterality: Left;  sleep apnea   COLONOSCOPY WITH PROPOFOL  N/A 08/06/2015   Procedure: COLONOSCOPY WITH PROPOFOL ;  Surgeon: Lamar ONEIDA Holmes, MD;  Location: [facility] ENDOSCOPY;  Service: Endoscopy;  Laterality: N/A;   COLONOSCOPY WITH PROPOFOL  N/A 01/15/2016   Procedure: COLONOSCOPY WITH PROPOFOL ;  Surgeon: Lamar ONEIDA Holmes, MD;  Location: [facility];  Service: Endoscopy;  Laterality: N/A;   Urinary procedure      Current Outpatient Medications  Medication Sig Dispense Refill   ALPRAZolam  (XANAX ) 0.5 MG tablet Take 0.5 mg by mouth at bedtime.     apixaban  (ELIQUIS ) 5 MG TABS tablet Take 1 tablet (5 mg total) by mouth 2 (two) times daily. 180 tablet 1   calcium  carbonate (TUMS - DOSED IN MG ELEMENTAL CALCIUM ) 500 MG chewable tablet Chew 1-2 tablets by mouth 3 (three) times daily as needed for indigestion or heartburn.     dicyclomine  (BENTYL ) 20 MG tablet Take 10 mg by mouth 2 (two) times daily.     doxazosin  (CARDURA ) 2 MG tablet Take 1 tablet (2 mg total) by mouth 2 (two) times daily. 180 tablet 1   isosorbide  mononitrate (IMDUR ) 60 MG 24 hr tablet Take 1.5 tablets (90 mg total) by mouth in the morning and at bedtime. 270 tablet 3   levothyroxine  (SYNTHROID ) 75 MCG tablet Take by mouth.     metoprolol  succinate (TOPROL -XL) 25 MG 24 hr tablet Take 25 mg by mouth daily.     rosuvastatin (CRESTOR) 10 MG tablet Take 10 mg by mouth daily.     valsartan (DIOVAN) 160 MG tablet Take 160 mg by mouth 2 (two) times daily.     verapamil  (CALAN -SR) 120 MG CR tablet Take 0.5 tablets (60 mg total) by mouth daily. 90 tablet 0   Vitamin D , Ergocalciferol , (DRISDOL ) 1.25 MG (50000 UNIT) CAPS capsule Take 50,000 Units by mouth every 7 (seven)  days.     No current facility-administered medications for this visit.    Allergies:   Iohexol , Sulfa antibiotics, Clonidine derivatives, Floxin [ofloxacin], Hydralazine hcl, Lotrel [amlodipine  besy-benazepril  hcl], Potassium-containing compounds, Protonix  [pantoprazole  sodium], and Toprol  xl [metoprolol  succinate]   Social History:  The patient  reports that she has never smoked. She has never used smokeless tobacco. She reports that she does not drink alcohol and does not use drugs.   Family History:   family history includes Heart disease in her father; Hypertension in her father; Pneumonia in her father; Rheum arthritis in her mother; Stroke in her father and mother.    Review of Systems: Review of Systems  Constitutional: Negative.   HENT: Negative.    Respiratory: Negative.    Cardiovascular: Negative.   Gastrointestinal: Negative.   Musculoskeletal: Negative.   Neurological: Negative.   Psychiatric/Behavioral: Negative.    All other systems reviewed and are negative.   PHYSICAL EXAM: VS:  BP (!) 180/70 (BP Location: Left Arm, Patient Position: Sitting, Cuff Size: Normal)   Pulse 66   Ht 5' 1.5 (1.562 m)   Wt 149 lb (67.6 kg)   SpO2 95%   BMI 27.70 kg/m  ,  BMI Body mass index is 27.7 kg/m. Constitutional:  oriented to person, place, and time. No distress.  HENT:  Head: Grossly normal Eyes:  no discharge. No scleral icterus.  Neck: No JVD, no carotid bruits  Cardiovascular: Regular rate and rhythm, no murmurs appreciated Pulmonary/Chest: Clear to auscultation bilaterally, no wheezes or rails Abdominal: Soft.  no distension.  no tenderness.  Musculoskeletal: Normal range of motion Neurological:  normal muscle tone. Coordination normal. No atrophy Skin: Skin warm and dry Psychiatric: normal affect, pleasant  Recent Labs: 04/22/2023: ALT 16 04/23/2023: B Natriuretic Peptide 412.5 04/24/2023: BUN 25; Creatinine, Ser 1.18; Hemoglobin 12.4; Platelets 279; Potassium 3.9;  Sodium 123   Lipid Panel No results found for: CHOL, HDL, LDLCALC, TRIG   Wt Readings from Last 3 Encounters:  01/18/24 149 lb (67.6 kg)  11/05/23 153 lb 12.8 oz (69.8 kg)  09/20/23 152 lb 8 oz (69.2 kg)    ASSESSMENT AND PLAN:  Problem List Items Addressed This Visit       Cardiology Problems   Paroxysmal atrial fibrillation (HCC) - Primary   Relevant Medications   rosuvastatin (CRESTOR) 10 MG tablet   Other Relevant Orders   EKG 12-Lead (Completed)   Atrial fibrillation with rapid ventricular response (HCC)   Relevant Medications   rosuvastatin (CRESTOR) 10 MG tablet   Other Relevant Orders   EKG 12-Lead (Completed)   HTN (hypertension)   Relevant Medications   rosuvastatin (CRESTOR) 10 MG tablet   Other Relevant Orders   EKG 12-Lead (Completed)   Other Visit Diagnoses       Chronic diastolic heart failure (HCC)       Relevant Medications   rosuvastatin (CRESTOR) 10 MG tablet   Other Relevant Orders   EKG 12-Lead (Completed)     PAF (paroxysmal atrial fibrillation) (HCC)       Relevant Medications   rosuvastatin (CRESTOR) 10 MG tablet   Other Relevant Orders   EKG 12-Lead (Completed)     Coronary artery calcification       Relevant Medications   rosuvastatin (CRESTOR) 10 MG tablet   Other Relevant Orders   EKG 12-Lead (Completed)      Paroxysmal atrial fibrillation Episode of atrial fibrillation this morning approximately 4 hours resolved without intervention She does not want to change her regular medications and prefers to stay on metoprolol  succinate 25 twice daily verapamil  SR 120 daily -For additional episodes of atrial fibrillation recommended she take metoprolol  tartrate 50 mg x 1 with repeat if necessary to restore normal sinus rhythm - Continue Eliquis  5 twice daily  Essential hypertension Initial blood pressure elevated but improved on recheck Recommended she continue valsartan 160 mg twice daily, Verapamil  SR 120 mg daily, Metoprolol  25  mg twice a day  Isosorbide  mononitrate 90 BID Cardura  2 twice daily Monitor blood pressure at home  Coronary calcification Score 113, recently started on Crestor 10 daily Denies chest pain concerning for angina  Hyperlipidemia Continue Crestor 10 daily  Gait instability Recommend exercise program   Signed, Velinda Lunger, M.D., Ph.D. [facility] [place], [place] [phone]

## 2024-01-18 NOTE — Telephone Encounter (Signed)
 Patient c/o Palpitations: STAT if patient c/o lightheadedness, shortness of breath, or chest pain  How long have you had palpitations/irregular HR/ Afib? Are you having the symptoms now? Pt has been in Afib since 9am   Are you currently experiencing lightheadedness, SOB or CP? SOB and headache   Do you have a history of afib (atrial fibrillation) or irregular heart rhythm? Yes   Have you checked your BP or HR? (document readings if available): 204/102 HR 90  Are you experiencing any other symptoms? Just SOB and headache right now

## 2024-02-21 NOTE — Progress Notes (Deleted)
 Cardiology Clinic Note   Date: 02/21/2024 ID: ASAMI LAMBRIGHT, DOB 1941/03/28, MRN [id number]  Primary Cardiologist:  Evalene Lunger, MD  Chief Complaint   FIORA WEILL is a 83 y.o. female who presents to the clinic today for ***  Patient Profile   RAKAYLA RICKLEFS is followed by *** for the history outlined below.       Past medical history significant for: PAF. Chronic diastolic heart failure. Echo 04/23/2023: EF 55 to 60%.  No RWMA.  Mild LVH.  Grade I DD.  Normal RV size/function.  Mild MR.  Mild to moderate AI. Pulmonary artery hypertension. Hypertension. Renal artery duplex 07/31/2021: Isolated elevated peak systolic velocity within the mid aspect of the right renal arteries without associated abnormal right renal artery/aortic ratio or associated asymmetric renal atrophy, favored to be technique related though could be seen in the setting of early/mild renal artery stenosis. Hyperlipidemia. CT cardiac scoring 01/14/2023: Coronary calcium  score 113 (53rd percentile).  LAD 83.9, LCx 29.4. Lipid panel 01/07/2024: LDL 145, HDL 47, TG 57, total 204. OSA. Sarcoidosis. Hypothyroidism. GI bleed.  In summary, patient was previously followed by [facility] clinic cardiology.  Prior echo March 2022 revealed normal LV/RV size and function, EF 50%, severe MR, moderate TR.  Repeat echo January 2023 demonstrated normal LV function and mild MR.  She had an episode of A-fib with RVR in April 2023 treated in the emergency department with medications converting to NSR.  Hospital admission 01/10/2023 for A-fib.  Verapamil  was increased and as needed metoprolol  was added.  She converted back to NSR in the emergency department.  Patient was first evaluated by Dr. Gollan on 02/19/2023 for PAF at the request of PCP.  She reported adherence to Eliquis  5 mg twice daily.  She deferred amiodarone as previously offered by [facility] clinic.  It was recommended she start Toprol  titrating to 25  mg daily and continue verapamil  daily.  Use of flecainide for breakthrough arrhythmia was considered.  CT calcium  scoring demonstrated a calcium  score of 113 (LAD and LCx).  Patient had declined statin therapy.  Patient presented to [facility] ED on 04/22/2023 with sudden onset of shortness of breath.  SpO2 88% on room air.  She improved to 94% on 3 L of O2 via [place].  Fluids were stopped when shortness of breath worsened.  Her husband had recently been diagnosed with COVID and a couple of days later she lost taste and experience nausea and occasional diarrhea.  She was started on antivirals by PCP that were finished on the day of her arrival to the ED.  Initial vitals demonstrated BP 186/93, HR 58 bpm, respirations 19, afebrile.  COVID PCR was positive sodium 122, creatinine 1.03.  Chest x-ray showed interval increase central vascular prominence and interstitial consolidation consistent with CHF or fluid overload with small pericardial effusions beginning to form, background chronic interstitial changes.  She was diuresed with IV Lasix .  Echo showed normal LV/RV function, mild LVH, Grade I DD, mild MR, mild to moderate AI.   Upon follow up in November 2024, patient complained of noticing HR in the 40s first thing in the morning that resolved after eating breakfast. She was instructed to take verapamil  in the morning after breakfast instead of in the evening to see if this helped resolve first thing in the morning bradycardia. She followed up with Dr. Gollan in January 2025 and reported feeling weak and nervous after taking verapamil . She was instructed to cut verapamil  in half and increase  Toprol  to bid dosing.   Patient was seen in the office by Dr. Gollan on 09/20/2023 for evaluation of elevated BP.  She reported since decreasing verapamil  and increasing Toprol  she began noticing increase in BP with SBP in the 160s or higher.  Verapamil  was increased back to full dose and isosorbide  was increased to 90 mg twice daily.   BP at the time of her visit was 180/70.  She was maintaining normal sinus rhythm.  She was instructed to continue valsartan 160 mg twice daily, verapamil  was decreased to 60 mg daily, continue Toprol  25 mg at bedtime, isosorbide  90 mg twice daily, and add Cardura  2 mg twice daily. Upon follow up in April her BP was well controlled and no further medication changes were made.   Patient was last seen in the office by Dr. Gollan on 01/18/2024 for routine follow-up.  She reported to recent episodes of A-fib over the prior 2 weeks.  She reported home BP typically well-controlled.  Her BP was elevated initially but improved on recheck.  No medication changes were made.     History of Present Illness    Today, patient ***  PAF Patient denies palpitations or spontaneous bleeding concerns.  She*** -Increase physical activity as tolerated.   -Continue Toprol , verapamil , Eliquis . Appropriate Eliquis  dose.   Chronic diastolic heart failure Echo October 2024 showed normal LV/RV function, mild LVH, Grade I DD, mild MR, mild to moderate AI.  Patient denies  DOE, orthopnea or PND.  She sleeps on 2 pillows for comfort while using CPAP.  Patient*** Euvolemic and well compensated on exam. -Continue Toprol , valsartan, isosorbide .   Hypertension BP today***. No dizziness, blurred vision, or headaches reported. -Continue valsartan, Cardura , verapamil , Toprol , isosorbide .   Hyperlipidemia/Statin intolerance.  LDL 145 June 2025.  Patient was started on Crestor 10 mg by PCP.  She reports*** - Continue Crestor. - Continue to follow with PCP.  ROS: All other systems reviewed and are otherwise negative except as noted in History of Present Illness.  EKGs/Labs Reviewed        04/22/2023: ALT 16; AST 21 04/24/2023: BUN 25; Creatinine, Ser 1.18; Potassium 3.9; Sodium 123   04/24/2023: Hemoglobin 12.4; WBC 8.8   No results found for requested labs within last 365 days.   04/23/2023: B Natriuretic Peptide 412.5   ***  Risk Assessment/Calculations    {Does this patient have ATRIAL FIBRILLATION?:[id number]} No BP recorded.  {Refresh Note OR Click here to enter BP  :1}***        Physical Exam    VS:  There were no vitals taken for this visit. , BMI There is no height or weight on file to calculate BMI.  GEN: Well nourished, well developed, in no acute distress. Neck: No JVD or carotid bruits. Cardiac: *** RRR. *** No murmur. No rubs or gallops.   Respiratory:  Respirations regular and unlabored. Clear to auscultation without rales, wheezing or rhonchi. GI: Soft, nontender, nondistended. Extremities: Radials/DP/PT 2+ and equal bilaterally. No clubbing or cyanosis. No edema ***  Skin: Warm and dry, no rash. Neuro: Strength intact.  Assessment & Plan   ***  Disposition: ***     {Are you ordering a CV Procedure (e.g. stress test, cath, DCCV, TEE, etc)?   Press F2        :[id number]}   Signed, Barnie HERO. Aerionna Moravek, DNP, NP-C

## 2024-02-24 ENCOUNTER — Ambulatory Visit: Admitting: Student

## 2024-02-24 NOTE — Progress Notes (Signed)
 Order only

## 2024-03-24 ENCOUNTER — Other Ambulatory Visit: Payer: Self-pay | Admitting: Cardiovascular Disease

## 2024-04-15 NOTE — Progress Notes (Deleted)
 Cardiology Clinic Note   Date: 04/15/2024 ID: MARIEELENA BARTKO, DOB September 29, 1940, MRN [id number]  Primary Cardiologist:  Evalene Lunger, MD  Chief Complaint   Kristi Barrera is a 83 y.o. female who presents to the clinic today for ***  Patient Profile   Kristi Barrera is followed by *** for the history outlined below.       Past medical history significant for: PAF. Chronic diastolic heart failure. Echo 04/23/2023: EF 55 to 60%.  No RWMA.  Mild LVH.  Grade I DD.  Normal RV size/function.  Mild MR.  Mild to moderate AI. Pulmonary artery hypertension. Hypertension. Renal artery duplex 07/31/2021: Isolated elevated peak systolic velocity within the mid aspect of the right renal arteries without associated abnormal right renal artery/aortic ratio or associated asymmetric renal atrophy, favored to be technique related though could be seen in the setting of early/mild renal artery stenosis. Hyperlipidemia. CT cardiac scoring 01/14/2023: Coronary calcium  score 113 (53rd percentile).  LAD 83.9, LCx 29.4. Lipid panel 04/10/2024: LDL 139, HDL 52, TG 66, total 204. OSA. Sarcoidosis. Hypothyroidism. GI bleed.  In summary, patient was previously followed by [facility] clinic cardiology.  Prior echo March 2022 revealed normal LV/RV size and function, EF 50%, severe MR, moderate TR.  Repeat echo January 2023 demonstrated normal LV function and mild MR.  She had an episode of A-fib with RVR in April 2023 treated in the emergency department with medications converting to NSR.  Hospital admission 01/10/2023 for A-fib.  Verapamil  was increased and as needed metoprolol  was added.  She converted back to NSR in the emergency department.  Patient was first evaluated by Dr. Gollan on 02/19/2023 for PAF at the request of PCP.  She reported adherence to Eliquis  5 mg twice daily.  She deferred amiodarone as previously offered by [facility] clinic.  It was recommended she start Toprol  titrating to 25  mg daily and continue verapamil  daily.  Use of flecainide for breakthrough arrhythmia was considered.  CT calcium  scoring demonstrated a calcium  score of 113 (LAD and LCx).  Patient had declined statin therapy.  Patient presented to [facility] ED on 04/22/2023 with sudden onset of shortness of breath.  SpO2 88% on room air.  She improved to 94% on 3 L of O2 via [place].  Fluids were stopped when shortness of breath worsened.  Her husband had recently been diagnosed with COVID and a couple of days later she lost taste and experience nausea and occasional diarrhea.  She was started on antivirals by PCP that were finished on the day of her arrival to the ED.  Initial vitals demonstrated BP 186/93, HR 58 bpm, respirations 19, afebrile.  COVID PCR was positive sodium 122, creatinine 1.03.  Chest x-ray showed interval increase central vascular prominence and interstitial consolidation consistent with CHF or fluid overload with small pericardial effusions beginning to form, background chronic interstitial changes.  She was diuresed with IV Lasix .  Echo showed normal LV/RV function, mild LVH, Grade I DD, mild MR, mild to moderate AI.   Upon follow up in November 2024, patient complained of noticing HR in the 40s first thing in the morning that resolved after eating breakfast. She was instructed to take verapamil  in the morning after breakfast instead of in the evening to see if this helped resolve first thing in the morning bradycardia. She followed up with Dr. Gollan in January 2025 and reported feeling weak and nervous after taking verapamil . She was instructed to cut verapamil  in half and increase  Toprol  to bid dosing.   Patient was seen in the office on 09/20/2023 for evaluation of elevated BP.  She reported since decreasing verapamil  and increasing Toprol  she began noticing increase in BP with SBP in the 160s or higher.  Verapamil  was increased back to full dose and isosorbide  was increased to 90 mg twice daily.  BP at the  time of her visit was 180/70.  She was maintaining normal sinus rhythm.  She was instructed to continue valsartan 160 mg twice daily, verapamil  was decreased to 60 mg daily, continue Toprol  25 mg at bedtime, isosorbide  90 mg twice daily, and add Cardura  2 mg twice daily.   Patient was last seen in the office by by Dr. Gollan on 01/18/2024 for routine follow-up.  She reported experiencing 4 hours of A-fib that morning which resolved without intervention.  She did not want to make any medication changes.  She was instructed to take metoprolol  tartrate 50 mg x 1 for additional episodes of A-fib.  No other medication changes were made.     History of Present Illness    Today, patient ***  PAF Patient denies palpitations or spontaneous bleeding concerns.*** - Continue Toprol , verapamil , Eliquis , as needed metoprolol  tartrate. Appropriate Eliquis  dose.   Chronic diastolic heart failure Echo October 2024 showed normal LV/RV function, mild LVH, Grade I DD, mild MR, mild to moderate AI.  Patient*** Euvolemic and well compensated on exam.  - Continue Toprol , valsartan, isosorbide . - Increase physical activity as tolerated.   Hypertension BP today***. No dizziness, blurred vision, or headaches reported. -Continue valsartan, Cardura , verapamil , Toprol , isosorbide .   Hyperlipidemia/Statin intolerance.  LDL 139 September 2025, not at goal.  Patient continues to decline statin. -Continue to follow with PCP.   ROS: All other systems reviewed and are otherwise negative except as noted in History of Present Illness.  EKGs/Labs Reviewed        04/22/2023: ALT 16; AST 21 04/24/2023: BUN 25; Creatinine, Ser 1.18; Potassium 3.9; Sodium 123   04/24/2023: Hemoglobin 12.4; WBC 8.8   No results found for requested labs within last 365 days.   04/23/2023: B Natriuretic Peptide 412.5  ***  Risk Assessment/Calculations    {Does this patient have ATRIAL FIBRILLATION?:[phone]} No BP recorded.  {Refresh  Note OR Click here to enter BP  :1}***        Physical Exam    VS:  There were no vitals taken for this visit. , BMI There is no height or weight on file to calculate BMI.  GEN: Well nourished, well developed, in no acute distress. Neck: No JVD or carotid bruits. Cardiac: *** RRR. *** No murmur. No rubs or gallops.   Respiratory:  Respirations regular and unlabored. Clear to auscultation without rales, wheezing or rhonchi. GI: Soft, nontender, nondistended. Extremities: Radials/DP/PT 2+ and equal bilaterally. No clubbing or cyanosis. No edema ***  Skin: Warm and dry, no rash. Neuro: Strength intact.  Assessment & Plan   ***  Disposition: ***     {Are you ordering a CV Procedure (e.g. stress test, cath, DCCV, TEE, etc)?   Press F2        :[id number]}   Signed, Barnie HERO. Phiona Ramnauth, DNP, NP-C

## 2024-04-17 ENCOUNTER — Ambulatory Visit: Attending: Student | Admitting: Student

## 2024-04-18 ENCOUNTER — Other Ambulatory Visit: Payer: Self-pay | Admitting: Internal Medicine

## 2024-04-18 DIAGNOSIS — Z1231 Encounter for screening mammogram for malignant neoplasm of breast: Secondary | ICD-10-CM

## 2024-04-20 ENCOUNTER — Telehealth: Payer: Self-pay | Admitting: Cardiovascular Disease

## 2024-04-20 NOTE — Telephone Encounter (Signed)
 Patient c/o Palpitations: STAT if patient c/o lightheadedness, shortness of breath, or chest pain  How long have you had palpitations/irregular HR/ Afib? Are you having the symptoms now?  Has been in afib since since about 6:30 AM this morning. Still in afib. Feels palpitations. Questions whether she should take Metoprolol .  Are you currently experiencing lightheadedness, SOB or CP? No   Do you have a history of afib (atrial fibrillation) or irregular heart rhythm?  Hx afib the past 2-3 years.  Have you checked your BP or HR? (document readings if available):  BP is high today 173/101 Hasn't checked HR   Are you experiencing any other symptoms?  Nausea

## 2024-04-20 NOTE — Telephone Encounter (Signed)
 Pt called and now reports afib has passed, current BP (at 1 pm) 165/87 HR 59  Pt reports that earlier this morning pt felt jumping from her heart and BP 173/101 (no HR recorded)  Pt advised for next occurrence to first record BP and HR then take Lopressor  50 mg and record the time, wait an hour to reassess BP and HR, and to began to assess lopressor  effectiveness   Pt reports avoidance of caffeine or other stimulants  Pt counseled on new BP cuff recommendation after c/o current machine giving unusual codes and readings

## 2024-04-24 ENCOUNTER — Other Ambulatory Visit: Payer: Self-pay

## 2024-04-24 MED ORDER — METOPROLOL TARTRATE 50 MG PO TABS: 75.0000 mg | ORAL_TABLET | Freq: Two times a day (BID) | ORAL | 3 refills | Status: DC | PRN

## 2024-04-24 MED ORDER — METOPROLOL SUCCINATE ER 25 MG PO TB24: 37.5000 mg | ORAL_TABLET | Freq: Every day | ORAL | 3 refills | Status: DC

## 2024-04-24 MED ORDER — METOPROLOL TARTRATE 50 MG PO TABS: 50.0000 mg | ORAL_TABLET | Freq: Two times a day (BID) | ORAL | 3 refills | Status: AC | PRN

## 2024-04-24 NOTE — Telephone Encounter (Signed)
 Called patient and left message for call back.

## 2024-04-24 NOTE — Telephone Encounter (Signed)
 Patient returned RN's call.

## 2024-04-24 NOTE — Telephone Encounter (Signed)
 Called patient back regarding medication adjustments. Patient verbalized understanding. New prescription sent into pharmacy.

## 2024-04-24 NOTE — Telephone Encounter (Signed)
 Spoke with patient and notified her of the following from Dr. Gollan.  We could slowly increase the metoprolol  in effort to suppress afib spells. Would suggest she increase metoprolol  up to 1 1/2 pills twice a day (37.5 mg) We might eventually increase up to 50 twice a day Thx Tim Gollan  Patient verbalizes understanding. Prescription sent to preferred pharmacy.

## 2024-04-24 NOTE — Addendum Note (Signed)
 Addended by: CRISTOPHER OLIVIA PARAS on: 04/24/2024 03:56 PM   Modules accepted: Orders

## 2024-04-27 ENCOUNTER — Other Ambulatory Visit: Payer: Self-pay | Admitting: *Deleted

## 2024-04-27 ENCOUNTER — Telehealth: Payer: Self-pay | Admitting: Cardiovascular Disease

## 2024-04-27 MED ORDER — METOPROLOL SUCCINATE ER 25 MG PO TB24: 37.5000 mg | ORAL_TABLET | Freq: Two times a day (BID) | ORAL | Status: DC

## 2024-04-27 NOTE — Telephone Encounter (Signed)
 Pt of Dr.  Gollan. Called in requesting a refill of her Metoprolol  25 mg. In note to pt on 04/20/24, the Metroprolol 25 was changed to 1.5 tabs 2X Daily. This is how the Pt is taking this. The script sent in on 04/24/24 states to take 1.5 tabs Daily. Please clarify how this is supposed to be taken.

## 2024-04-27 NOTE — Telephone Encounter (Signed)
*  STAT* If patient is at the pharmacy, call can be transferred to refill team.   1. Which medications need to be refilled? (please list name of each medication and dose if known)    metoprolol  succinate (TOPROL -XL) 25 MG 24 hr tablet     2. Would you like to learn more about the convenience, safety, & potential cost savings by using the [facility]?     3. Are you open to using the [facility] (Type [facility].  ).   4. Which pharmacy/location (including street and city if local pharmacy) is medication to be sent to? [facility] [address], [place] - [address]    5. Do they need a 30 day or 90 day supply? 90 day

## 2024-04-27 NOTE — Telephone Encounter (Signed)
 Returned pt's call and pt stated that her prescription stated that she take Metoprolol  Succinate (TOPROL  XL) 37.7 mg twice daily, but her prescription stated once daily.  Correction was made to prescription, but not sent pharmacy.  Pt states that she had picked up the prescription, but just wanted the correction made.  Pt thanked me for the follow up phone call.

## 2024-05-16 ENCOUNTER — Other Ambulatory Visit: Payer: Self-pay | Admitting: Family Medicine

## 2024-05-16 ENCOUNTER — Ambulatory Visit
Admission: RE | Admit: 2024-05-16 | Discharge: 2024-05-16 | Disposition: A | Discharge status: Home / Self Care | Source: Ambulatory Visit | Attending: Family Medicine | Admitting: Family Medicine

## 2024-05-16 DIAGNOSIS — M7989 Other specified soft tissue disorders: Secondary | ICD-10-CM | POA: Insufficient documentation

## 2024-05-16 DIAGNOSIS — M47817 Spondylosis without myelopathy or radiculopathy, lumbosacral region: Secondary | ICD-10-CM | POA: Diagnosis not present

## 2024-05-16 DIAGNOSIS — S300XXA Contusion of lower back and pelvis, initial encounter: Secondary | ICD-10-CM | POA: Insufficient documentation

## 2024-05-16 DIAGNOSIS — Z7901 Long term (current) use of anticoagulants: Secondary | ICD-10-CM | POA: Diagnosis not present

## 2024-05-16 DIAGNOSIS — M533 Sacrococcygeal disorders, not elsewhere classified: Secondary | ICD-10-CM | POA: Insufficient documentation

## 2024-05-16 DIAGNOSIS — I700 Atherosclerosis of aorta: Secondary | ICD-10-CM | POA: Diagnosis not present

## 2024-05-16 DIAGNOSIS — W19XXXA Unspecified fall, initial encounter: Secondary | ICD-10-CM

## 2024-05-16 DIAGNOSIS — M129 Arthropathy, unspecified: Secondary | ICD-10-CM | POA: Insufficient documentation

## 2024-05-18 ENCOUNTER — Encounter

## 2024-05-22 ENCOUNTER — Other Ambulatory Visit: Payer: Self-pay | Admitting: Cardiovascular Disease

## 2024-05-22 NOTE — Telephone Encounter (Signed)
 Prescription refill request for Eliquis  received. Indication:afib Last office visit:7/25 Scr:0.9  10/25 Age: 83 Weight:67.6  kg  Prescription refilled

## 2024-05-29 ENCOUNTER — Ambulatory Visit: Admitting: Cardiovascular Disease

## 2024-06-19 ENCOUNTER — Ambulatory Visit
Admission: RE | Admit: 2024-06-19 | Discharge: 2024-06-19 | Disposition: A | Source: Ambulatory Visit | Attending: Internal Medicine | Admitting: Internal Medicine

## 2024-06-19 DIAGNOSIS — Z1231 Encounter for screening mammogram for malignant neoplasm of breast: Secondary | ICD-10-CM | POA: Diagnosis present

## 2024-07-03 ENCOUNTER — Encounter: Payer: Self-pay | Admitting: Cardiovascular Disease

## 2024-07-03 ENCOUNTER — Ambulatory Visit: Admitting: Sleep Medicine

## 2024-07-03 ENCOUNTER — Encounter: Payer: Self-pay | Admitting: Sleep Medicine

## 2024-07-03 ENCOUNTER — Ambulatory Visit: Attending: Cardiovascular Disease | Admitting: Cardiovascular Disease

## 2024-07-03 VITALS — BP 100/70 | HR 50 | Temp 97.7°F | Ht 61.0 in | Wt 153.8 lb

## 2024-07-03 VITALS — BP 168/70 | HR 59 | Ht 61.0 in | Wt 153.1 lb

## 2024-07-03 DIAGNOSIS — G4733 Obstructive sleep apnea (adult) (pediatric): Secondary | ICD-10-CM

## 2024-07-03 DIAGNOSIS — I480 Paroxysmal atrial fibrillation: Secondary | ICD-10-CM | POA: Diagnosis not present

## 2024-07-03 DIAGNOSIS — I10 Essential (primary) hypertension: Secondary | ICD-10-CM

## 2024-07-03 DIAGNOSIS — I5032 Chronic diastolic (congestive) heart failure: Secondary | ICD-10-CM | POA: Diagnosis not present

## 2024-07-03 NOTE — Patient Instructions (Addendum)
 Referral to pulmonary for OSA, CPAP not working well  Medication Instructions:  No changes  Extra does of metoprolol  for afib spells  If you need a refill on your cardiac medications before your next appointment, please call your pharmacy.   Lab work: No new labs needed  Testing/Procedures: No new testing needed  Follow-Up: At [facility], you and your health needs are our priority.  As part of our continuing mission to provide you with exceptional heart care, we have created designated Provider Care Teams.  These Care Teams include your primary Cardiologist (physician) and Advanced Practice Providers (APPs -  Physician Assistants and Nurse Practitioners) who all work together to provide you with the care you need, when you need it.  You will need a follow up appointment in 12 months  Providers on your designated Care Team:   Lonni Meager, NP Bernardino Bring, PA-C Cadence [place], [place]  COVID-19 Vaccine Information can be found at: [email or web address] For questions related to vaccine distribution or appointments, please email [email or web address] or call [phone].

## 2024-07-03 NOTE — Progress Notes (Unsigned)
 Name:Kristi Barrera MRN: [id number] DOB: [birth year]   CHIEF COMPLAINT:  ESTABLISH CARE FOR OSA   HISTORY OF PRESENT ILLNESS: Kristi Barrera is a 83 y.o. w/ a h/o OSA, atrial fibrillation, HTN, hypothyroidism and anxiety who presents to establish care for OSA. Reports that she was initially diagnosed with OSA around 10 years ago and was subsequently started on CPAP therapy. Reports using CPAP therapy every night, which is confirmed by compliance data. She is currently using the Swift FX bella nasal pillow mask, which is comfortable. Reports difficulty sleeping with the CPAP device the entire night due to pressure fluctuations. Reports occasionally feeling more refreshed upon awakening with PAP therapy.  Reports nocturnal awakenings due to elevated pressure and mouth breathing. Denies any significant weight changes. Admits to dry mouth. Denies morning headaches, RLS symptoms, dream enactment, cataplexy, hypnagogic or hypnapompic hallucinations. Denies a family history of sleep apnea. Denies drowsy driving. Denies alcohol, tobacco or illicit drug use.   Bedtime 11:30 pm Sleep onset 20 mins Rise time 8 am   EPWORTH SLEEP SCORE 4     No data to display         PAST MEDICAL HISTORY :   has a past medical history of Abnormal cardiac valve, Anxiety, Arthritis, Chest pain, Chronic low back pain, Colitis, chronic, ulcerative (HCC), Complication of anesthesia, Dysrhythmia, Family history of adverse reaction to anesthesia, GERD (gastroesophageal reflux disease), HTN (hypertension), Hyperlipidemia, Hypothyroidism, LBBB (left bundle branch block), Low blood potassium, PAF (paroxysmal atrial fibrillation) (HCC), Palpitations, Recurrent UTI, Sarcoidosis, Sinusitis, Skin cancer, Sleep apnea, Thyroid  disease, Vitamin D  deficiency, and Wears dentures.  has a past surgical history that includes Urinary procedure; Colonoscopy with propofol  (N/A, 08/06/2015); Colonoscopy with propofol  (N/A,  01/15/2016); Cataract extraction w/PHACO (Right, 04/06/2017); Breast cyst aspiration (Left); Breast cyst aspiration (Right, 09/25/2019); and Cataract extraction w/PHACO (Left, 04/13/2022). Prior to Admission medications  Medication Sig Start Date End Date Taking? Authorizing Provider  ALPRAZolam  (XANAX ) 0.5 MG tablet Take 0.5 mg by mouth at bedtime.    [provider]  apixaban  (ELIQUIS ) 5 MG TABS tablet Take 1 tablet by mouth twice daily 05/22/24   Gollan, Timothy J, MD  calcium  carbonate (TUMS - DOSED IN MG ELEMENTAL CALCIUM ) 500 MG chewable tablet Chew 1-2 tablets by mouth 3 (three) times daily as needed for indigestion or heartburn.    [provider]  dicyclomine  (BENTYL ) 20 MG tablet Take 10 mg by mouth 2 (two) times daily.    [provider]  doxazosin  (CARDURA ) 2 MG tablet Take 1 tablet (2 mg total) by mouth 2 (two) times daily. 09/20/23   Gollan, Timothy J, MD  isosorbide  mononitrate (IMDUR ) 60 MG 24 hr tablet Take 1.5 tablets (90 mg total) by mouth in the morning and at bedtime. 11/05/23   Gollan, Timothy J, MD  metoprolol  succinate (TOPROL -XL) 25 MG 24 hr tablet Take 1.5 tablets (37.5 mg total) by mouth 2 (two) times daily. 04/27/24   Gollan, Timothy J, MD  metoprolol  tartrate (LOPRESSOR ) 50 MG tablet Take 1 tablet (50 mg total) by mouth 2 (two) times daily as needed (for afib spells). 04/24/24   Gollan, Timothy J, MD  rosuvastatin (CRESTOR) 10 MG tablet Take 10 mg by mouth daily. 01/14/24 01/13/25  [provider]  SYNTHROID  125 MCG tablet Take 125 mcg by mouth every morning.    [provider]  valsartan (DIOVAN) 160 MG tablet Take 160 mg by mouth 2 (two) times daily.    [provider]  verapamil  (CALAN -SR) 120 MG CR tablet Take 1/2 (one-half) tablet by mouth once daily 03/24/24   Gollan, Timothy J, MD  Vitamin D , Ergocalciferol , (DRISDOL ) 1.25 MG (50000 UNIT) CAPS capsule Take 50,000 Units by mouth every 7 (seven) days.    [provider]   Allergies[1]  FAMILY HISTORY:  family history includes Heart disease in her father; Hypertension in her father; Pneumonia in her father; Rheum arthritis in her mother; Stroke in her father and mother. SOCIAL HISTORY:  reports that she has never smoked. She has never used smokeless tobacco. She reports that she does not drink alcohol and does not use drugs.   Review of Systems:  Gen:  Denies  fever, sweats, chills weight loss  HEENT: Denies blurred vision, double vision, ear pain, eye pain, hearing loss, nose bleeds, sore throat Cardiac:  No dizziness, chest pain or heaviness, chest tightness,edema, No JVD Resp:   No cough, -sputum production, -shortness of breath,-wheezing, -hemoptysis,  Gi: Denies swallowing difficulty, stomach pain, nausea or vomiting, diarrhea, constipation, bowel incontinence Gu:  Denies bladder incontinence, burning urine Ext:   Denies Joint pain, stiffness or swelling Skin: Denies  skin rash, easy bruising or bleeding or hives Endoc:  Denies polyuria, polydipsia , polyphagia or weight change Psych:   Denies depression, insomnia or hallucinations  Other:  All other systems negative  VITAL SIGNS: BP 100/70   Pulse (!) 50   Temp 97.7 F (36.5 C)   Ht 5' 1 (1.549 m)   Wt 153 lb 12.8 oz (69.8 kg)   SpO2 98%   BMI 29.06 kg/m    Physical Examination:   General Appearance: No distress  EYES PERRLA, EOM intact.   NECK Supple, No JVD Pulmonary: normal breath sounds, No wheezing.  CardiovascularNormal S1,S2.  No m/r/g.   Abdomen: Benign, Soft, non-tender. Skin:   warm, no rashes, no ecchymosis  Extremities: normal, no cyanosis, clubbing. Neuro:without focal findings,  speech normal  PSYCHIATRIC: Mood, affect within normal limits.   ASSESSMENT AND PLAN  OSA Patient is using and benefiting from CPAP therapy. Will adjust pressure range and decrease max pressure to improve comfort. Discussed the consequences of untreated sleep apnea. Advised not to  drive drowsy for safety of patient and others. Will follow up in 3 months.    HTN Stable, on current management. Following with PCP.    Patient  satisfied with Plan of action and management. All questions answered  I spent a total of 30 minutes reviewing chart data, face-to-face evaluation with the patient, counseling and coordination of care as detailed above.    Nyeem Stoke, M.D.  Sleep Medicine [facility]           [1]  Allergies Allergen Reactions   Iohexol  Shortness Of Breath   Sulfa Antibiotics Anaphylaxis, Swelling and Other (See Comments)   Clonidine Derivatives     Swelling, chest tightness   Floxin [Ofloxacin] Other (See Comments)    Hallucinations    Hydralazine Hcl     BP dropped, HR increased, weakness, nausea, syncope   Lotrel [Amlodipine  Besy-Benazepril  Hcl] Itching   Potassium-Containing Compounds Other (See Comments)    Patient states she is allergic to oral Potassium Chloride  only, had chest pains and a slight rash    Protonix  [Pantoprazole  Sodium] Swelling   Rosuvastatin Other (See Comments)   Toprol  Xl [Metoprolol  Succinate]     fatigue

## 2024-07-03 NOTE — Progress Notes (Signed)
 Cardiology Office Note  Date:  07/03/2024   ID:  Kristi Barrera, DOB 09/05/1940, MRN [id number]  PCP:  Kristi Reyes BIRCH, MD   Chief Complaint  Patient presents with   Follow-up    Patient had a spell of A-Fib and shortness of breath in November 2025 but since then she has no cardiac complaints.     HPI:  [name] is a 83 year old woman with past medical history of Atrial fibrillation, paroxysmal Hypertension Hyperlipidemia Osa on CPAP Calcium  scoring 113 Who presents for follow-up of her paroxysmal atrial fibrillation, HTN  Last seen by myself in clinic 01/2024  Recent fall, tripped getting out of bed, hip pain on left and shoulder Seen in the ER, Not broken Held eliquis  3 days Developed hematoma  1 month ago with palpitations, took extra dose of metoprolol  with improvement of her rhythm  New CPAP machine Not working as well, too strong, speed of the air Used to stay on 4, machine keeps changing setting higher and higher as the night progresses, uncomfortable and has to take it off in the middle of the night - Interested in following up with pulmonary, used to see Dr. Alaine  Taking verapamil  SR 120 mg daily Metoprolol  succinate 25 twice daily Isosorbide  90 mg twice daily, Cardura  2 mg twice daily  EKG personally reviewed by myself on todays visit EKG Interpretation Date/Time:  Monday July 03 2024 13:29:21 EST Ventricular Rate:  59 PR Interval:  186 QRS Duration:  146 QT Interval:  466 QTC Calculation: 461 R Axis:   -14  Text Interpretation: Sinus bradycardia Left bundle branch block When compared with ECG of 18-Jan-2024 15:08, No significant change was found Confirmed by Perla Lye [phone]) on 07/03/2024 1:48:31 PM   Several medication intolerances Spionolactone caused low sodium, renal dysfunction Diltiazem  causes sickness /weak Clonidine: chest tightness Hydralazine: weak among other intolerances  Covid  10/24 Hospitalized with respiratory distress D/c with lasix , was stopped as outpt  Atrial fibrillation April, 2023   converted to NSR in the ER  Echocardiogram March 2022,  Normal left and right ventricular size and function  EF 50% severe MR Moderate TR  Repeat echocardiogram January 2023,  Normal LV function Mild MR  Previously declined amiodarone   PMH:   has a past medical history of Abnormal cardiac valve, Anxiety, Arthritis, Chest pain, Chronic low back pain, Colitis, chronic, ulcerative (HCC), Complication of anesthesia, Dysrhythmia, Family history of adverse reaction to anesthesia, GERD (gastroesophageal reflux disease), HTN (hypertension), Hyperlipidemia, Hypothyroidism, LBBB (left bundle branch block), Low blood potassium, PAF (paroxysmal atrial fibrillation) (HCC), Palpitations, Recurrent UTI, Sarcoidosis, Sinusitis, Skin cancer, Sleep apnea, Thyroid  disease, Vitamin D  deficiency, and Wears dentures.  PSH:    Past Surgical History:  Procedure Laterality Date   BREAST CYST ASPIRATION Left    neg   BREAST CYST ASPIRATION Right 09/25/2019   CATARACT EXTRACTION W/PHACO Right 04/06/2017   Procedure: CATARACT EXTRACTION PHACO AND INTRAOCULAR LENS PLACEMENT (IOC);  Surgeon: Jaye Fallow, MD;  Location: [facility];  Service: Ophthalmology;  Laterality: Right;  US  00:36.1 AP% 18.5 CDE 6.66 fluid Pack lot # [id number]   CATARACT EXTRACTION W/PHACO Left 04/13/2022   Procedure: CATARACT EXTRACTION PHACO AND INTRAOCULAR LENS PLACEMENT (IOC) LEFT 8.82 00:40.8;  Surgeon: Myrna Adine Anes, MD;  Location: [facility];  Service: Ophthalmology;  Laterality: Left;  sleep apnea   COLONOSCOPY WITH PROPOFOL  N/A 08/06/2015   Procedure: COLONOSCOPY WITH PROPOFOL ;  Surgeon: Lamar ONEIDA Holmes, MD;  Location: [facility];  Service: Endoscopy;  Laterality: N/A;   COLONOSCOPY WITH PROPOFOL  N/A 01/15/2016   Procedure: COLONOSCOPY WITH PROPOFOL ;  Surgeon: Lamar ONEIDA Holmes, MD;  Location:  [facility];  Service: Endoscopy;  Laterality: N/A;   Urinary procedure      Current Outpatient Medications  Medication Sig Dispense Refill   ALPRAZolam  (XANAX ) 0.5 MG tablet Take 0.5 mg by mouth at bedtime.     apixaban  (ELIQUIS ) 5 MG TABS tablet Take 1 tablet by mouth twice daily 180 tablet 1   calcium  carbonate (TUMS - DOSED IN MG ELEMENTAL CALCIUM ) 500 MG chewable tablet Chew 1-2 tablets by mouth 3 (three) times daily as needed for indigestion or heartburn.     dicyclomine  (BENTYL ) 20 MG tablet Take 10 mg by mouth 2 (two) times daily.     doxazosin  (CARDURA ) 2 MG tablet Take 1 tablet (2 mg total) by mouth 2 (two) times daily. 180 tablet 1   isosorbide  mononitrate (IMDUR ) 60 MG 24 hr tablet Take 1.5 tablets (90 mg total) by mouth in the morning and at bedtime. 270 tablet 3   metoprolol  succinate (TOPROL -XL) 25 MG 24 hr tablet Take 1.5 tablets (37.5 mg total) by mouth 2 (two) times daily.     metoprolol  tartrate (LOPRESSOR ) 50 MG tablet Take 1 tablet (50 mg total) by mouth 2 (two) times daily as needed (for afib spells). 60 tablet 3   rosuvastatin (CRESTOR) 10 MG tablet Take 10 mg by mouth daily.     SYNTHROID  125 MCG tablet Take 125 mcg by mouth every morning.     valsartan (DIOVAN) 160 MG tablet Take 160 mg by mouth 2 (two) times daily.     verapamil  (CALAN -SR) 120 MG CR tablet Take 1/2 (one-half) tablet by mouth once daily 90 tablet 3   Vitamin D , Ergocalciferol , (DRISDOL ) 1.25 MG (50000 UNIT) CAPS capsule Take 50,000 Units by mouth every 7 (seven) days.     No current facility-administered medications for this visit.    Allergies:   Iohexol , Sulfa antibiotics, Clonidine derivatives, Floxin [ofloxacin], Hydralazine hcl, Lotrel [amlodipine  besy-benazepril  hcl], Potassium-containing compounds, Protonix  [pantoprazole  sodium], Rosuvastatin, and Toprol  xl [metoprolol  succinate]   Social History:  The patient  reports that she has never smoked. She has never used smokeless tobacco. She  reports that she does not drink alcohol and does not use drugs.   Family History:   family history includes Heart disease in her father; Hypertension in her father; Pneumonia in her father; Rheum arthritis in her mother; Stroke in her father and mother.    Review of Systems: Review of Systems  Constitutional: Negative.   HENT: Negative.    Respiratory: Negative.    Cardiovascular: Negative.   Gastrointestinal: Negative.   Musculoskeletal: Negative.   Neurological: Negative.   Psychiatric/Behavioral: Negative.    All other systems reviewed and are negative.   PHYSICAL EXAM: VS:  BP (!) 168/70 (BP Location: Left Arm, Patient Position: Sitting, Cuff Size: Normal)   Pulse (!) 59   Ht 5' 1 (1.549 m)   Wt 153 lb 2 oz (69.5 kg)   SpO2 96%   BMI 28.93 kg/m  , BMI Body mass index is 28.93 kg/m. Constitutional:  oriented to person, place, and time. No distress.  HENT:  Head: Grossly normal Eyes:  no discharge. No scleral icterus.  Neck: No JVD, no carotid bruits  Cardiovascular: Regular rate and rhythm, no murmurs appreciated Pulmonary/Chest: Clear to auscultation bilaterally, no wheezes or rails Abdominal: Soft.  no distension.  no tenderness.  Musculoskeletal: Normal range of motion Neurological:  normal muscle tone. Coordination normal. No atrophy Skin: Skin warm and dry Psychiatric: normal affect, pleasant   Recent Labs: No results found for requested labs within last 365 days.   Lipid Panel No results found for: CHOL, HDL, LDLCALC, TRIG   Wt Readings from Last 3 Encounters:  07/03/24 153 lb 2 oz (69.5 kg)  01/18/24 149 lb (67.6 kg)  11/05/23 153 lb 12.8 oz (69.8 kg)    ASSESSMENT AND PLAN:  Problem List Items Addressed This Visit       Cardiology Problems   Paroxysmal atrial fibrillation (HCC) - Primary   Relevant Orders   EKG 12-Lead (Completed)   HTN (hypertension)   Relevant Orders   EKG 12-Lead (Completed)   Other Visit Diagnoses        Chronic diastolic heart failure (HCC)       Relevant Orders   EKG 12-Lead (Completed)      Paroxysmal atrial fibrillation -Rare episodes, typically well-controlled on  metoprolol  succinate 25 twice daily verapamil  SR 120 daily -Recommend she take metoprolol  tartrate as needed for breakthrough arrhythmia - Continue Eliquis  5 twice daily  Essential hypertension Blood pressure elevated on today's visit, recommend close monitoring of her pressure at home, no changes made - Repeat pressure systolic 150 - Recommend she call us  if numbers continue to run high  Coronary calcification Score 113, continue Crestor 10 daily Denies chest pain concerning for angina  Hyperlipidemia Continue Crestor 10 daily  Gait instability Recommend exercise program  Sleep apnea Reports that she has a new machine, usually starts on a setting of 4.  During the night machine seems to auto adjust higher with increased pressure and speed making it uncomfortable for her, she has to take off the CPAP.  Recommend she follow-up with pulmonary for titration.  Perhaps auto titration could be taken off   Signed, Tim Nixon Kolton, M.D., Ph.D. [facility] [place], [place] [phone]

## 2024-07-03 NOTE — Patient Instructions (Addendum)

## 2024-07-21 ENCOUNTER — Ambulatory Visit: Admitting: Cardiovascular Disease

## 2024-07-24 ENCOUNTER — Other Ambulatory Visit: Payer: Self-pay

## 2024-07-24 ENCOUNTER — Telehealth: Payer: Self-pay | Admitting: Cardiovascular Disease

## 2024-07-24 ENCOUNTER — Other Ambulatory Visit: Payer: Self-pay | Admitting: Cardiovascular Disease

## 2024-07-24 MED ORDER — METOPROLOL SUCCINATE ER 25 MG PO TB24: 37.5000 mg | ORAL_TABLET | Freq: Two times a day (BID) | ORAL | 1 refills | Status: DC

## 2024-07-24 NOTE — Telephone Encounter (Signed)
" °*  STAT* If patient is at the pharmacy, call can be transferred to refill team.   1. Which medications need to be refilled? (please list name of each medication and dose if known)   metoprolol  succinate (TOPROL -XL) 25 MG 24 hr tablet     2. Would you like to learn more about the convenience, safety, & potential cost savings by using the [facility]?      3. Are you open to using the [facility] (Type [facility].    4. Which pharmacy/location (including street and city if local pharmacy) is medication to be sent to? [facility] [address], [place] - [address]    5. Do they need a 30 day or 90 day supply? 90    "

## 2024-08-20 ENCOUNTER — Other Ambulatory Visit: Payer: Self-pay | Admitting: Cardiovascular Disease

## 2024-10-02 ENCOUNTER — Ambulatory Visit: Admitting: Sleep Medicine
